# Patient Record
Sex: Female | Born: 1950 | Race: Black or African American | Hispanic: No | Smoking: Former smoker
Health system: Southern US, Community
[De-identification: ages and names within clinical notes are randomized; demographics above are authoritative.]

## PROBLEM LIST (undated history)

## (undated) DIAGNOSIS — Z86010 Personal history of colonic polyps: Secondary | ICD-10-CM

## (undated) DIAGNOSIS — G4733 Obstructive sleep apnea (adult) (pediatric): Secondary | ICD-10-CM

## (undated) DIAGNOSIS — J309 Allergic rhinitis, unspecified: Secondary | ICD-10-CM

## (undated) DIAGNOSIS — I10 Essential (primary) hypertension: Secondary | ICD-10-CM

## (undated) DIAGNOSIS — G4730 Sleep apnea, unspecified: Secondary | ICD-10-CM

## (undated) DIAGNOSIS — Z862 Personal history of diseases of the blood and blood-forming organs and certain disorders involving the immune mechanism: Secondary | ICD-10-CM

## (undated) DIAGNOSIS — E785 Hyperlipidemia, unspecified: Secondary | ICD-10-CM

## (undated) DIAGNOSIS — Z860101 Personal history of adenomatous and serrated colon polyps: Secondary | ICD-10-CM

## (undated) DIAGNOSIS — R3129 Other microscopic hematuria: Secondary | ICD-10-CM

## (undated) DIAGNOSIS — D649 Anemia, unspecified: Secondary | ICD-10-CM

## (undated) DIAGNOSIS — D3A029 Benign carcinoid tumor of the large intestine, unspecified portion: Secondary | ICD-10-CM

## (undated) DIAGNOSIS — Z9989 Dependence on other enabling machines and devices: Secondary | ICD-10-CM

## (undated) DIAGNOSIS — M722 Plantar fascial fibromatosis: Secondary | ICD-10-CM

## (undated) DIAGNOSIS — K219 Gastro-esophageal reflux disease without esophagitis: Secondary | ICD-10-CM

## (undated) DIAGNOSIS — R011 Cardiac murmur, unspecified: Secondary | ICD-10-CM

## (undated) DIAGNOSIS — E210 Primary hyperparathyroidism: Secondary | ICD-10-CM

## (undated) DIAGNOSIS — R51 Headache: Secondary | ICD-10-CM

## (undated) HISTORY — DX: Cardiac murmur, unspecified: R01.1

## (undated) HISTORY — DX: Benign carcinoid tumor of the large intestine, unspecified portion: D3A.029

## (undated) HISTORY — DX: Obstructive sleep apnea (adult) (pediatric): G47.33

## (undated) HISTORY — DX: Primary hyperparathyroidism: E21.0

## (undated) HISTORY — PX: OTHER SURGICAL HISTORY: SHX169

## (undated) HISTORY — DX: Other microscopic hematuria: R31.29

## (undated) HISTORY — DX: Personal history of diseases of the blood and blood-forming organs and certain disorders involving the immune mechanism: Z86.2

## (undated) HISTORY — PX: COLONOSCOPY W/ POLYPECTOMY: SHX1380

## (undated) HISTORY — DX: Hypercalcemia: E83.52

## (undated) HISTORY — DX: Personal history of colonic polyps: Z86.010

## (undated) HISTORY — PX: ESOPHAGOGASTRODUODENOSCOPY: SHX1529

## (undated) HISTORY — DX: Hyperlipidemia, unspecified: E78.5

## (undated) HISTORY — DX: Essential (primary) hypertension: I10

## (undated) HISTORY — DX: Personal history of adenomatous and serrated colon polyps: Z86.0101

## (undated) HISTORY — DX: Obstructive sleep apnea (adult) (pediatric): Z99.89

## (undated) HISTORY — DX: Plantar fascial fibromatosis: M72.2

## (undated) HISTORY — DX: Anemia, unspecified: D64.9

## (undated) HISTORY — DX: Allergic rhinitis, unspecified: J30.9

## (undated) HISTORY — PX: TUBAL LIGATION: SHX77

## (undated) HISTORY — DX: Gastro-esophageal reflux disease without esophagitis: K21.9

## (undated) HISTORY — PX: OVARIAN CYST REMOVAL: SHX89

## (undated) HISTORY — DX: Headache: R51

## (undated) HISTORY — DX: Sleep apnea, unspecified: G47.30

---

## 2006-02-10 ENCOUNTER — Ambulatory Visit: Payer: Self-pay | Admitting: Internal Medicine

## 2006-04-19 ENCOUNTER — Emergency Department (HOSPITAL_COMMUNITY): Admission: EM | Admit: 2006-04-19 | Discharge: 2006-04-19 | Payer: Self-pay | Admitting: Emergency Medicine

## 2007-05-31 ENCOUNTER — Ambulatory Visit: Payer: Self-pay | Admitting: Internal Medicine

## 2007-12-08 ENCOUNTER — Emergency Department (HOSPITAL_COMMUNITY): Admission: EM | Admit: 2007-12-08 | Discharge: 2007-12-08 | Payer: Self-pay | Admitting: Emergency Medicine

## 2007-12-14 ENCOUNTER — Ambulatory Visit: Payer: Self-pay | Admitting: Internal Medicine

## 2007-12-27 DIAGNOSIS — D3A029 Benign carcinoid tumor of the large intestine, unspecified portion: Secondary | ICD-10-CM

## 2007-12-27 HISTORY — DX: Benign carcinoid tumor of the large intestine, unspecified portion: D3A.029

## 2008-01-04 DIAGNOSIS — Z860100 Personal history of colon polyps, unspecified: Secondary | ICD-10-CM

## 2008-01-04 HISTORY — DX: Personal history of colonic polyps: Z86.010

## 2008-01-04 HISTORY — DX: Personal history of colon polyps, unspecified: Z86.0100

## 2008-01-10 ENCOUNTER — Ambulatory Visit: Payer: Self-pay | Admitting: Internal Medicine

## 2008-01-10 ENCOUNTER — Encounter: Payer: Self-pay | Admitting: Internal Medicine

## 2008-02-18 ENCOUNTER — Ambulatory Visit: Payer: Self-pay | Admitting: Internal Medicine

## 2008-04-25 LAB — HM MAMMOGRAPHY: HM Mammogram: NORMAL

## 2008-07-10 ENCOUNTER — Ambulatory Visit: Payer: Self-pay | Admitting: Cardiology

## 2009-04-10 ENCOUNTER — Telehealth: Payer: Self-pay | Admitting: Internal Medicine

## 2009-04-29 ENCOUNTER — Ambulatory Visit: Payer: Self-pay | Admitting: Internal Medicine

## 2009-05-08 ENCOUNTER — Ambulatory Visit: Payer: Self-pay | Admitting: Internal Medicine

## 2009-05-08 DIAGNOSIS — J309 Allergic rhinitis, unspecified: Secondary | ICD-10-CM | POA: Insufficient documentation

## 2009-05-08 DIAGNOSIS — R51 Headache: Secondary | ICD-10-CM | POA: Insufficient documentation

## 2009-05-08 DIAGNOSIS — E785 Hyperlipidemia, unspecified: Secondary | ICD-10-CM

## 2009-05-08 DIAGNOSIS — R519 Headache, unspecified: Secondary | ICD-10-CM | POA: Insufficient documentation

## 2009-05-08 HISTORY — DX: Hyperlipidemia, unspecified: E78.5

## 2009-05-11 ENCOUNTER — Encounter (INDEPENDENT_AMBULATORY_CARE_PROVIDER_SITE_OTHER): Payer: Self-pay | Admitting: *Deleted

## 2009-05-11 LAB — CONVERTED CEMR LAB
ALT: 19 units/L (ref 0–35)
AST: 18 units/L (ref 0–37)
Albumin: 4.1 g/dL (ref 3.5–5.2)
Alkaline Phosphatase: 112 units/L (ref 39–117)
BUN: 13 mg/dL (ref 6–23)
Basophils Absolute: 0 10*3/uL (ref 0.0–0.1)
Basophils Relative: 0.7 % (ref 0.0–3.0)
Bilirubin Urine: NEGATIVE
Bilirubin, Direct: 0.1 mg/dL (ref 0.0–0.3)
CO2: 30 meq/L (ref 19–32)
Calcium: 10.6 mg/dL — ABNORMAL HIGH (ref 8.4–10.5)
Chloride: 108 meq/L (ref 96–112)
Cholesterol: 285 mg/dL — ABNORMAL HIGH (ref 0–200)
Creatinine, Ser: 0.7 mg/dL (ref 0.4–1.2)
Direct LDL: 187.6 mg/dL
Eosinophils Absolute: 0.1 10*3/uL (ref 0.0–0.7)
Eosinophils Relative: 2.1 % (ref 0.0–5.0)
GFR calc non Af Amer: 110.82 mL/min (ref >60)
Glucose, Bld: 82 mg/dL (ref 70–99)
HCT: 36.2 % (ref 36.0–46.0)
HDL: 57.7 mg/dL (ref >39.00)
Hemoglobin: 11.9 g/dL — ABNORMAL LOW (ref 12.0–15.0)
Ketones, ur: NEGATIVE mg/dL
Leukocytes, UA: NEGATIVE
Lymphocytes Relative: 46.3 % — ABNORMAL HIGH (ref 12.0–46.0)
Lymphs Abs: 3 10*3/uL (ref 0.7–4.0)
MCHC: 32.8 g/dL (ref 30.0–36.0)
MCV: 82.3 fL (ref 78.0–100.0)
Monocytes Absolute: 0.6 10*3/uL (ref 0.1–1.0)
Monocytes Relative: 8.6 % (ref 3.0–12.0)
Neutro Abs: 2.7 10*3/uL (ref 1.4–7.7)
Neutrophils Relative %: 42.3 % — ABNORMAL LOW (ref 43.0–77.0)
Nitrite: NEGATIVE
Platelets: 252 10*3/uL (ref 150.0–400.0)
Potassium: 4.6 meq/L (ref 3.5–5.1)
RBC: 4.4 M/uL (ref 3.87–5.11)
RDW: 12.9 % (ref 11.5–14.6)
Sodium: 141 meq/L (ref 135–145)
Specific Gravity, Urine: >=1.03 (ref 1.000–1.030)
TSH: 0.97 microintl units/mL (ref 0.35–5.50)
Total Bilirubin: 0.6 mg/dL (ref 0.3–1.2)
Total CHOL/HDL Ratio: 5
Total Protein, Urine: NEGATIVE mg/dL
Total Protein: 7.9 g/dL (ref 6.0–8.3)
Triglycerides: 110 mg/dL (ref 0.0–149.0)
Urine Glucose: NEGATIVE mg/dL
Urobilinogen, UA: 0.2 (ref 0.0–1.0)
VLDL: 22 mg/dL (ref 0.0–40.0)
WBC: 6.4 10*3/uL (ref 4.5–10.5)
pH: 5 (ref 5.0–8.0)

## 2009-05-14 ENCOUNTER — Ambulatory Visit: Payer: Self-pay | Admitting: Internal Medicine

## 2009-05-14 ENCOUNTER — Encounter: Payer: Self-pay | Admitting: Internal Medicine

## 2009-05-26 ENCOUNTER — Encounter: Payer: Self-pay | Admitting: Internal Medicine

## 2009-07-02 ENCOUNTER — Telehealth: Payer: Self-pay | Admitting: Internal Medicine

## 2009-07-02 ENCOUNTER — Encounter (INDEPENDENT_AMBULATORY_CARE_PROVIDER_SITE_OTHER): Payer: Self-pay | Admitting: *Deleted

## 2009-07-02 ENCOUNTER — Ambulatory Visit: Payer: Self-pay | Admitting: Internal Medicine

## 2009-07-02 DIAGNOSIS — R3129 Other microscopic hematuria: Secondary | ICD-10-CM | POA: Insufficient documentation

## 2009-07-02 HISTORY — DX: Other microscopic hematuria: R31.29

## 2009-07-02 LAB — CONVERTED CEMR LAB
Bilirubin Urine: NEGATIVE
Ketones, ur: NEGATIVE mg/dL
Urobilinogen, UA: 1 (ref 0.0–1.0)

## 2009-07-22 ENCOUNTER — Telehealth: Payer: Self-pay | Admitting: Internal Medicine

## 2009-08-12 ENCOUNTER — Encounter: Payer: Self-pay | Admitting: Internal Medicine

## 2009-08-26 ENCOUNTER — Encounter: Payer: Self-pay | Admitting: Internal Medicine

## 2009-10-02 ENCOUNTER — Ambulatory Visit: Payer: Self-pay | Admitting: Internal Medicine

## 2009-10-02 DIAGNOSIS — M76899 Other specified enthesopathies of unspecified lower limb, excluding foot: Secondary | ICD-10-CM | POA: Insufficient documentation

## 2009-11-23 ENCOUNTER — Ambulatory Visit: Payer: Self-pay | Admitting: Internal Medicine

## 2009-11-23 DIAGNOSIS — M542 Cervicalgia: Secondary | ICD-10-CM | POA: Insufficient documentation

## 2009-11-24 LAB — CONVERTED CEMR LAB
Calcium: 10.6 mg/dL — ABNORMAL HIGH (ref 8.4–10.5)
Chloride: 106 meq/L (ref 96–112)
Creatinine, Ser: 1 mg/dL (ref 0.4–1.2)
Hgb A1c MFr Bld: 5.9 % (ref 4.6–6.5)
Potassium: 4.7 meq/L (ref 3.5–5.1)
Sodium: 141 meq/L (ref 135–145)

## 2010-01-06 ENCOUNTER — Ambulatory Visit: Payer: Self-pay | Admitting: Internal Medicine

## 2010-01-13 ENCOUNTER — Ambulatory Visit: Payer: Self-pay | Admitting: Internal Medicine

## 2010-01-13 LAB — CONVERTED CEMR LAB
HDL: 67.1 mg/dL (ref >39.00)
Total CHOL/HDL Ratio: 3
Triglycerides: 62 mg/dL (ref 0.0–149.0)
VLDL: 12.4 mg/dL (ref 0.0–40.0)

## 2010-07-01 ENCOUNTER — Ambulatory Visit: Payer: Self-pay | Admitting: Internal Medicine

## 2010-07-01 DIAGNOSIS — R198 Other specified symptoms and signs involving the digestive system and abdomen: Secondary | ICD-10-CM | POA: Insufficient documentation

## 2010-07-01 LAB — CONVERTED CEMR LAB
Albumin: 4.1 g/dL (ref 3.5–5.2)
Bilirubin, Direct: 0.1 mg/dL (ref 0.0–0.3)
Total Bilirubin: 0.5 mg/dL (ref 0.3–1.2)
Total Protein: 7.7 g/dL (ref 6.0–8.3)

## 2010-10-01 ENCOUNTER — Ambulatory Visit: Payer: Self-pay | Admitting: Internal Medicine

## 2011-01-19 ENCOUNTER — Other Ambulatory Visit: Payer: Self-pay | Admitting: Internal Medicine

## 2011-01-19 ENCOUNTER — Ambulatory Visit
Admission: RE | Admit: 2011-01-19 | Discharge: 2011-01-19 | Payer: Self-pay | Source: Home / Self Care | Attending: Internal Medicine | Admitting: Internal Medicine

## 2011-01-19 DIAGNOSIS — K219 Gastro-esophageal reflux disease without esophagitis: Secondary | ICD-10-CM | POA: Insufficient documentation

## 2011-01-19 DIAGNOSIS — R1084 Generalized abdominal pain: Secondary | ICD-10-CM | POA: Insufficient documentation

## 2011-01-19 DIAGNOSIS — M549 Dorsalgia, unspecified: Secondary | ICD-10-CM | POA: Insufficient documentation

## 2011-01-19 LAB — LDL CHOLESTEROL, DIRECT: Direct LDL: 136.2 mg/dL

## 2011-01-19 LAB — URINALYSIS, ROUTINE W REFLEX MICROSCOPIC
Ketones, ur: NEGATIVE
Total Protein, Urine: NEGATIVE

## 2011-01-19 LAB — LIPID PANEL
HDL: 64.3 mg/dL (ref >39.00)
Total CHOL/HDL Ratio: 3
Triglycerides: 125 mg/dL (ref 0.0–149.0)
VLDL: 25 mg/dL (ref 0.0–40.0)

## 2011-01-19 LAB — CBC WITH DIFFERENTIAL/PLATELET
Basophils Absolute: 0 10*3/uL (ref 0.0–0.1)
Basophils Relative: 0.3 % (ref 0.0–3.0)
Eosinophils Absolute: 0.2 10*3/uL (ref 0.0–0.7)
HCT: 37.1 % (ref 36.0–46.0)
MCHC: 32.9 g/dL (ref 30.0–36.0)
Monocytes Absolute: 0.7 10*3/uL (ref 0.1–1.0)
Monocytes Relative: 8.8 % (ref 3.0–12.0)
Neutrophils Relative %: 45.4 % (ref 43.0–77.0)
RBC: 4.5 Mil/uL (ref 3.87–5.11)
RDW: 13.3 % (ref 11.5–14.6)
WBC: 7.6 10*3/uL (ref 4.5–10.5)

## 2011-01-19 LAB — BASIC METABOLIC PANEL
CO2: 30 mEq/L (ref 19–32)
Creatinine, Ser: 0.7 mg/dL (ref 0.4–1.2)
GFR: 104.96 mL/min (ref >60.00)
Glucose, Bld: 90 mg/dL (ref 70–99)
Potassium: 4.3 mEq/L (ref 3.5–5.1)
Sodium: 142 mEq/L (ref 135–145)

## 2011-01-19 LAB — HEPATIC FUNCTION PANEL
AST: 18 U/L (ref 0–37)
Alkaline Phosphatase: 108 U/L (ref 39–117)

## 2011-01-24 ENCOUNTER — Encounter: Payer: Self-pay | Admitting: Internal Medicine

## 2011-01-24 ENCOUNTER — Ambulatory Visit
Admission: RE | Admit: 2011-01-24 | Discharge: 2011-01-24 | Payer: Self-pay | Source: Home / Self Care | Attending: Internal Medicine | Admitting: Internal Medicine

## 2011-01-25 DIAGNOSIS — E210 Primary hyperparathyroidism: Secondary | ICD-10-CM | POA: Insufficient documentation

## 2011-01-25 LAB — CONVERTED CEMR LAB
Calcium, Total (PTH): 10.6 mg/dL — ABNORMAL HIGH (ref 8.4–10.5)
PTH: 182.9 pg/mL — ABNORMAL HIGH (ref 14.0–72.0)

## 2011-01-25 NOTE — Assessment & Plan Note (Signed)
Summary: changes in bowel movement--ch.   History of Present Illness Visit Type: Follow-up Visit Primary GI MD: Stan Head MD [facility] Primary Provider: Newt Lukes MD Chief Complaint: changes in bowel movements, blood tinged mucous in past History of Present Illness:   In February she noticed some bright red blood and mucous in the stool, while she had been on Mobic. Short-lived but resolved. She also had a white stool after Tums, one time. Chronic mild alternating bowel habits otherwise unchanged.   GI Review of Systems      Denies abdominal pain, acid reflux, belching, bloating, chest pain, dysphagia with liquids, dysphagia with solids, heartburn, loss of appetite, nausea, vomiting, vomiting blood, weight loss, and  weight gain.      Reports change in bowel habits, hemorrhoids, and  rectal bleeding.     Denies anal fissure, black tarry stools, constipation, diarrhea, diverticulosis, fecal incontinence, heme positive stool, irritable bowel syndrome, jaundice, light color stool, liver problems, and  rectal pain. Clinical Reports Reviewed:  Colonoscopy:  05/14/2009:  1) Scar in the rectum, site of prior carcinoid (NEGATIVE FOR CARCINOID) 2) Internal hemorrhoids 3) Otherwise normal examination, excellent prep 4) pror adenomas, max size 4 cm, 1/09   Preventive Screening-Counseling & Management      Drug Use:  no.      Current Medications (verified): 1)  Simvastatin 20 Mg Tabs (Simvastatin) .... Take 1 By Mouth At Bedtime  Allergies (verified): No Known Drug Allergies  Past History:  Past Medical History: Hyperlipidemia Allergic rhinitis microscopic hemauturia  Adenomatous Colon Polyps Rectal carcinoid  Past Surgical History: Reviewed history from 05/08/2009 and no changes required. Colon polypectomy x's 4 Tubal ligation  Family History: Reviewed history from 05/08/2009 and no changes required. History of coronary artery disease in her family with her  father having PCI at age 34. positive family history for lung cancer No FH of Colon Cancer:  Social History: Reviewed history from 05/08/2009 and no changes required. Single, has two children.  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 Ex-smoker- quit 20 years ago Alcohol Use - no Daily Caffeine Use Illicit Drug Use - no Drug Use:  no  Vital Signs:  Patient profile:   60 year old female Height:      61 inches Weight:      200 pounds BMI:     37.93 Pulse rate:   84 / minute Pulse rhythm:   regular BP sitting:   118 / 76  (left arm)  Vitals Entered By: Milford Cage NCMA (October 01, 2010 4:09 PM)  Physical Exam  General:  Well developed, well nourished, no acute distress.obese.     Impression & Recommendations:  Problem # 1:  CHANGE IN BOWELS (ICD-787.99) Assessment Unchanged chronic mild alternating bowel habits white stool from Tums no work-up needed, no changes  Problem # 2:  RECTAL BLEEDING (ICD-569.3) Assessment: New Transient has hemorrhoids has not recurred since February due for routine colonoscopy 04/2011 and would not change that  Patient Instructions: 1)  Please continue current medications.  2)  Call back for persistent changes.  If you continue to do well, we will see in around May 2012 for your colonoscopy. 3)  The medication list was reviewed and reconciled.  All changed / newly prescribed medications were explained.  A complete medication list was provided to the patient / caregiver.

## 2011-01-25 NOTE — Assessment & Plan Note (Signed)
Summary: 3 MO ROV /NWS   Vital Signs:  Patient profile:   60 year old female Height:      61 inches (154.94 cm) Weight:      197 pounds (89.55 kg) O2 Sat:      99 % on Room air Temp:     98.2 degrees F (36.78 degrees C) oral Pulse rate:   72 / minute BP sitting:   112 / 72  (left arm) Cuff size:   large  Vitals Entered By: Josph Macho CMA (January 06, 2010 3:46 PM)  O2 Flow:  Room air CC: 3 month follow up/ pt states she is no longer using Clotrimazole or taking Methocarbamol/ CF Is Patient Diabetic? No   Primary Care Provider:  Newt Lukes MD  CC:  3 month follow up/ pt states she is no longer using Clotrimazole or taking Methocarbamol/ CF.  History of Present Illness: 1) dyslipidemia - reports compliance with ongoing medical treatment and no changes in medication dose or frequency. denies adverse side effects related to current therapy.   2)OA/bursitis r hip - reports compliance with ongoing medical treatment and no changes in medication dose or frequency. denies adverse side effects related to current therapy.   Clinical Review Panels:  Lipid Management   Cholesterol:  285 (05/08/2009)   HDL (good cholesterol):  57.70 (05/08/2009)   Current Medications (verified): 1)  Simvastatin 20 Mg Tabs (Simvastatin) .... Take 1 By Mouth At Bedtime 2)  Meloxicam 7.5 Mg Tabs (Meloxicam) .Marland Kitchen.. 1 By Mouth Once Daily As Needed For Arthritis Pain  Allergies (verified): No Known Drug Allergies  Review of Systems  The patient denies fever, weight loss, chest pain, syncope, muscle weakness, and difficulty walking.    Physical Exam  General:  alert, well-developed, well-nourished, and cooperative to examination.   overweight appearing Lungs:  normal respiratory effort, no intercostal retractions or use of accessory muscles; normal breath sounds bilaterally - no crackles and no wheezes.    Heart:  normal rate, regular rhythm, no murmur, and no rub. BLE without edema.    Abdomen:  soft, non-tender, normal bowel sounds, no distention; no masses and no appreciable hepatomegaly or splenomegaly.     Impression & Recommendations:  Problem # 1:  HYPERLIPIDEMIA (ICD-272.4) need to check FLP and LFTs when fasting - to return in AM next 48h - plan to cont current meds if well controlled- refills to be called in once reviewed -  Her updated medication list for this problem includes:    Simvastatin 20 Mg Tabs (Simvastatin) .Marland Kitchen... Take 1 by mouth at bedtime  Labs Reviewed: SGOT: 18 (05/08/2009)   SGPT: 19 (05/08/2009)   HDL:57.70 (05/08/2009)  Chol:285 (05/08/2009)  Trig:110.0 (05/08/2009)  Complete Medication List: 1)  Simvastatin 20 Mg Tabs (Simvastatin) .... Take 1 by mouth at bedtime 2)  Meloxicam 7.5 Mg Tabs (Meloxicam) .Marland Kitchen.. 1 by mouth once daily as needed for arthritis pain  Patient Instructions: 1)  it was good to see you today. 2)  return for your blood work in the AM when you are fasting 3)  Hepatic Panel, ICD-9:v58.69 4)  Lipid Panel, ICD-9:272.4 5)  your results will be posted on the phone tree for review in 48-72 hours from the time of test completion; call [phone] and enter your 9 digit MRN (listed above on this page, just below your name); if any changes need to be made or there are abnormal results, you will be contacted directly.  6)  Please schedule a follow-up  appointment in 6 months, sooner if problems.

## 2011-01-25 NOTE — Assessment & Plan Note (Signed)
Summary: WHITE BOWEL MOVEMENT THIS AM---STC   Vital Signs:  Patient profile:   60 year old female Height:      61 inches (154.94 cm) Weight:      198.6 pounds (90.27 kg) BMI:     37.66 O2 Sat:      97 % on Room air Temp:     97.3 degrees F (36.28 degrees C) oral Pulse rate:   68 / minute BP sitting:   122 / 72  (left arm) Cuff size:   large  Vitals Entered By: Orlan Leavens (July 01, 2010 9:21 AM)  O2 Flow:  Room air CC: Concern about having white bowel movement Is Patient Diabetic? No Pain Assessment Patient in pain? no        Primary Care Provider:  Newt Lukes MD  CC:  Concern about having white bowel movement.  History of Present Illness: c/o "white bowel movement" this AM x 1 - denies diet changes or abd pain - no BRBPR or change in BMs before today's event no D or C, no N/V after reading online, became worried about her liver and pancreas -  would like labs done today to check same  also review other med issues 1) dyslipidemia - reports compliance with ongoing medical treatment and no changes in medication dose or frequency. denies adverse side effects related to current therapy. no myalgias or GI upset (see above)  2)OA/bursitis r hip - reports compliance with ongoing medical treatment and no changes in medication dose or frequency. denies adverse side effects related to current therapy.  no swelling in joints  Clinical Review Panels:  Lipid Management   Cholesterol:  183 (01/13/2010)   LDL (bad choesterol):  104 (01/13/2010)   HDL (good cholesterol):  67.10 (01/13/2010)  Diabetes Management   HgBA1C:  5.9 (11/23/2009)   Creatinine:  1.0 (11/23/2009)  Complete Metabolic Panel   Glucose:  86 (11/23/2009)   Sodium:  141 (11/23/2009)   Potassium:  4.7 (11/23/2009)   Chloride:  106 (11/23/2009)   CO2:  29 (11/23/2009)   BUN:  14 (11/23/2009)   Creatinine:  1.0 (11/23/2009)   Albumin:  4.0 (01/13/2010)   Total Protein:  7.4 (01/13/2010)  Calcium:  10.6 (11/23/2009)   Total Bili:  0.6 (01/13/2010)   Alk Phos:  102 (01/13/2010)   SGPT (ALT):  23 (01/13/2010)   SGOT (AST):  22 (01/13/2010)   Current Medications (verified): 1)  Simvastatin 20 Mg Tabs (Simvastatin) .... Take 1 By Mouth At Bedtime 2)  Meloxicam 7.5 Mg Tabs (Meloxicam) .Marland Kitchen.. 1 By Mouth Once Daily As Needed For Arthritis Pain  Allergies (verified): No Known Drug Allergies  Past History:  Past Medical History: Hyperlipidemia Allergic rhinitis microscopic hemauturia   Review of Systems  The patient denies anorexia, fever, weight loss, chest pain, peripheral edema, and abdominal pain.         c/o back pain when lying flat on back, improved with massage or position change  Physical Exam  General:  alert, well-developed, well-nourished, and cooperative to examination.   overweight appearing Lungs:  normal respiratory effort, no intercostal retractions or use of accessory muscles; normal breath sounds bilaterally - no crackles and no wheezes.    Heart:  normal rate, regular rhythm, no murmur, and no rub. BLE without edema.  Abdomen:  soft, non-tender, normal bowel sounds, no distention; no masses and no appreciable hepatomegaly or splenomegaly.     Impression & Recommendations:  Problem # 1:  CHANGE IN BOWELS (  ICD-787.99)  one time event this AM -sample not available - expect related to diet intake - no systemic symptoms to suggest hepatobilary or panc dz - check labs as related to statins and lipase at pt request fpr pancreas check - to f/u if cont BM abn recurrs  Orders: TLB-Hepatic/Liver Function Pnl (80076-HEPATIC) TLB-Lipase (83690-LIPASE)  Problem # 2:  HYPERLIPIDEMIA (ICD-272.4)  hold statin x 48 h, then resume if normal BM and labs Her updated medication list for this problem includes:    Simvastatin 20 Mg Tabs (Simvastatin) .Marland Kitchen... Take 1 by mouth at bedtime  Labs Reviewed: SGOT: 22 (01/13/2010)   SGPT: 23 (01/13/2010)   HDL:67.10  (01/13/2010), 57.70 (05/08/2009)  LDL:104 (01/13/2010)  Chol:183 (01/13/2010), 285 (05/08/2009)  Trig:62.0 (01/13/2010), 110.0 (05/08/2009)  Complete Medication List: 1)  Simvastatin 20 Mg Tabs (Simvastatin) .... Take 1 by mouth at bedtime 2)  Meloxicam 7.5 Mg Tabs (Meloxicam) .Marland Kitchen.. 1 by mouth once daily as needed for arthritis pain  Patient Instructions: 1)  it was good to see you today. 2)  test(s) ordered today - your results will be posted on the phone tree for review in 48-72 hours from the time of test completion; call [phone] and enter your 9 digit MRN (listed above on this page, just below your name); if any changes need to be made or there are abnormal results, you will be contacted directly.  3)  hold simvastatin x 48hours, then resume as before if normal labs and normal BMs 4)  Please schedule a follow-up appointment in 6 months, sooner if problems.

## 2011-01-25 NOTE — Progress Notes (Signed)
Summary: [facility]   Imported By: Sherian Rein 10/04/2010 10:30:14  _____________________________________________________________________  External Attachment:    Type:   Image     Comment:   External Document

## 2011-01-27 NOTE — Assessment & Plan Note (Signed)
Summary: back pain and stomach problems-lb   Vital Signs:  Patient profile:   60 year old female Height:      61 inches (154.94 cm) Weight:      200 pounds (90.91 kg) O2 Sat:      98 % on Room air Temp:     98.4 degrees F (36.89 degrees C) oral Pulse rate:   76 / minute BP sitting:   130 / 72  (left arm) Cuff size:   large  Vitals Entered By: Orlan Leavens RMA (January 19, 2011 1:13 PM)  O2 Flow:  Room air CC: Upper back pain & stomach problems Is Patient Diabetic? No Pain Assessment Patient in pain? yes     Location: upper back Type: aching   Primary Care Provider:  Newt Lukes MD  CC:  Upper back pain & stomach problems.  History of Present Illness: c/o stomach pain onset 2 mo ago assoc with upper mid back pain, not positional - no injury not changed with food - type of meals some imporved abd pain with tums symptoms worse at bedtime, lying flat denies diet changes or BM changes - no BRBPR or change in BMs no N/V, no weight loss - no use otc nsaids  also review other med issues 1) dyslipidemia - reports compliance with ongoing medical treatment and no changes in medication dose or frequency. denies adverse side effects related to current therapy. no myalgias   2)OA/bursitis r hip - reports compliance with ongoing medical treatment (otc tylenol) and no changes in medication dose or frequency. denies adverse side effects related to current therapy.  no swelling in joints  Clinical Review Panels:  Lipid Management   Cholesterol:  183 (01/13/2010)   LDL (bad choesterol):  104 (01/13/2010)   HDL (good cholesterol):  67.10 (01/13/2010)  CBC   WBC:  6.4 (05/08/2009)   RBC:  4.40 (05/08/2009)   Hgb:  11.9 (05/08/2009)   Hct:  36.2 (05/08/2009)   Platelets:  252.0 (05/08/2009)   MCV  82.3 (05/08/2009)   MCHC  32.8 (05/08/2009)   RDW  12.9 (05/08/2009)   PMN:  42.3 (05/08/2009)   Lymphs:  46.3 (05/08/2009)   Monos:  8.6 (05/08/2009)   Eosinophils:  2.1  (05/08/2009)   Basophil:  0.7 (05/08/2009)  Complete Metabolic Panel   Glucose:  86 (11/23/2009)   Sodium:  141 (11/23/2009)   Potassium:  4.7 (11/23/2009)   Chloride:  106 (11/23/2009)   CO2:  29 (11/23/2009)   BUN:  14 (11/23/2009)   Creatinine:  1.0 (11/23/2009)   Albumin:  4.1 (07/01/2010)   Total Protein:  7.7 (07/01/2010)   Calcium:  10.6 (11/23/2009)   Total Bili:  0.5 (07/01/2010)   Alk Phos:  94 (07/01/2010)   SGPT (ALT):  17 (07/01/2010)   SGOT (AST):  17 (07/01/2010)   Current Medications (verified): 1)  Simvastatin 20 Mg Tabs (Simvastatin) .... Take 1 By Mouth At Bedtime  Allergies (verified): No Known Drug Allergies  Past History:  Past Medical History: Hyperlipidemia Allergic rhinitis microscopic hemauturia  Adenomatous Colon Polyps Rectal carcinoid  MD roster:  GI - gessner  Past Surgical History: Colon polypectomy x's 4 Tubal ligation   Review of Systems  The patient denies weight loss, weight gain, chest pain, headaches, and severe indigestion/heartburn.    Physical Exam  General:  alert, well-developed, well-nourished, and cooperative to examination.   overweight appearing - nontoxic Lungs:  normal respiratory effort, no intercostal retractions or use of accessory muscles; normal  breath sounds bilaterally - no crackles and no wheezes.    Heart:  normal rate, regular rhythm, no murmur, and no rub. BLE without edema.  Abdomen:  soft, non-tender, normal bowel sounds, no distention; no masses and no appreciable hepatomegaly or splenomegaly.   Msk:  back: full range of motion of lumbar spine. Nontender to palpation. Negative straight leg raise. Deep tendon reflexes symmetrically intact. Sensation intact throughout all dermatomes in bilateral lower extremities. Full strength to manual muscle testing. Able to heel and toe walk without difficulty and ambulates with a normal gait.    Impression & Recommendations:  Problem # 1:  ABDOMINAL PAIN,  GENERALIZED (ICD-789.07)  nonsp exam and hx - ?GERD - check labs now and start emperic ppi - consider further eval as needed depending on labs and response to tx - keep ROV with GI as planned Orders: TLB-BMP (Basic Metabolic Panel-BMET) (80048-METABOL) TLB-CBC Platelet - w/Differential (85025-CBCD) TLB-Hepatic/Liver Function Pnl (80076-HEPATIC) TLB-Udip w/ Micro (81001-URINE) TLB-TSH (Thyroid Stimulating Hormone) (84443-TSH)  Discussed symptom control with the patient.   Problem # 2:  GERD (ICD-530.81)  Her updated medication list for this problem includes:    Pantoprazole Sodium 20 Mg Tbec (Pantoprazole sodium) .Marland Kitchen... 1 by mouth once daily  Orders: TLB-BMP (Basic Metabolic Panel-BMET) (80048-METABOL) TLB-CBC Platelet - w/Differential (85025-CBCD) TLB-Hepatic/Liver Function Pnl (80076-HEPATIC) Prescription Created Electronically [phone])  Labs Reviewed: Hgb: 11.9 (05/08/2009)   Hct: 36.2 (05/08/2009)  Problem # 3:  UNSPECIFIED BACKACHE (ICD-724.5) timing correlates with abd pain - w/u and tx plans as above -  back exam benign and no trauma hx, no weight loss  Orders: TLB-BMP (Basic Metabolic Panel-BMET) (80048-METABOL) TLB-CBC Platelet - w/Differential (85025-CBCD) TLB-Hepatic/Liver Function Pnl (80076-HEPATIC) TLB-Udip w/ Micro (81001-URINE)  Problem # 4:  HYPERLIPIDEMIA (ICD-272.4)  Her updated medication list for this problem includes:    Simvastatin 20 Mg Tabs (Simvastatin) .Marland Kitchen... Take 1 by mouth at bedtime  Orders: TLB-Lipid Panel (80061-LIPID)  Labs Reviewed: SGOT: 17 (07/01/2010)   SGPT: 17 (07/01/2010)   HDL:67.10 (01/13/2010), 57.70 (05/08/2009)  LDL:104 (01/13/2010)  Chol:183 (01/13/2010), 285 (05/08/2009)  Trig:62.0 (01/13/2010), 110.0 (05/08/2009)  Complete Medication List: 1)  Simvastatin 20 Mg Tabs (Simvastatin) .... Take 1 by mouth at bedtime 2)  Pantoprazole Sodium 20 Mg Tbec (Pantoprazole sodium) .Marland Kitchen.. 1 by mouth once daily  Patient Instructions: 1)   it was good to see you today. 2)  test(s) ordered today - your results will be posted on the phone tree for review in 48-72 hours from the time of test completion; call [phone] and enter your 9 digit MRN (listed above on this page, just below your name); if any changes need to be made or there are abnormal results, you will be contacted directly. 3)  start generic protonix for stomach and reflux symptoms  - your prescription has been electronically submitted to your pharmacy. Please take as directed. Contact our office if you believe you're having problems with the medication(s).  4)  no other medication changes - ok to use tylenol as needed  5)  depending on labs and your response to medications, let us know if symptoms unimproved in next few weeks, sooner if worse - keep followup with dr. Leone Payor as planned 6)  Please schedule a follow-up appointment in 6 months for cholesterol check, call sooner if problems.  Prescriptions: PANTOPRAZOLE SODIUM 20 MG TBEC (PANTOPRAZOLE SODIUM) 1 by mouth once daily  #30 x 3   Entered and Authorized by:   Newt Lukes MD   Signed  by:   Newt Lukes MD on 01/19/2011   Method used:   Electronically to        [facility]  [address] [phone]* (retail)       [address]       [place], [place]  [address]       Ph: [phone] or [phone]       Fax: [phone]   RxID:   [id number]    Orders Added: 1)  Est. Patient Level IV [36644] 2)  TLB-BMP (Basic Metabolic Panel-BMET) [80048-METABOL] 3)  TLB-CBC Platelet - w/Differential [85025-CBCD] 4)  TLB-Hepatic/Liver Function Pnl [80076-HEPATIC] 5)  TLB-Udip w/ Micro [81001-URINE] 6)  Prescription Created Electronically [G8553] 7)  TLB-Lipid Panel [80061-LIPID] 8)  TLB-TSH (Thyroid Stimulating Hormone) [03474-QVZ]

## 2011-02-11 ENCOUNTER — Ambulatory Visit: Payer: Self-pay | Admitting: Endocrinology

## 2011-02-24 ENCOUNTER — Encounter: Payer: Self-pay | Admitting: Endocrinology

## 2011-02-24 ENCOUNTER — Ambulatory Visit (INDEPENDENT_AMBULATORY_CARE_PROVIDER_SITE_OTHER): Payer: BC Managed Care – PPO | Admitting: Endocrinology

## 2011-02-24 DIAGNOSIS — E210 Primary hyperparathyroidism: Secondary | ICD-10-CM

## 2011-02-25 ENCOUNTER — Other Ambulatory Visit: Payer: Self-pay | Admitting: Endocrinology

## 2011-02-25 ENCOUNTER — Other Ambulatory Visit: Payer: BC Managed Care – PPO

## 2011-02-25 ENCOUNTER — Ambulatory Visit
Admission: RE | Admit: 2011-02-25 | Discharge: 2011-02-25 | Disposition: A | Discharge status: Home / Self Care | Payer: BC Managed Care – PPO | Source: Ambulatory Visit | Attending: Endocrinology | Admitting: Endocrinology

## 2011-02-25 ENCOUNTER — Encounter: Payer: Self-pay | Admitting: Endocrinology

## 2011-02-25 ENCOUNTER — Ambulatory Visit (INDEPENDENT_AMBULATORY_CARE_PROVIDER_SITE_OTHER)
Admission: RE | Admit: 2011-02-25 | Discharge: 2011-02-25 | Disposition: A | Discharge status: Home / Self Care | Payer: BC Managed Care – PPO | Source: Ambulatory Visit | Attending: Endocrinology | Admitting: Endocrinology

## 2011-02-25 DIAGNOSIS — E210 Primary hyperparathyroidism: Secondary | ICD-10-CM

## 2011-03-01 ENCOUNTER — Other Ambulatory Visit: Payer: Self-pay

## 2011-03-07 ENCOUNTER — Encounter: Payer: Self-pay | Admitting: Internal Medicine

## 2011-03-08 NOTE — Assessment & Plan Note (Signed)
Summary: NEW ENDO/ THYROID /  [employer] [facility]ss[facility] BY DR LESCHBER/NWS   Vital Signs:  Patient profile:   60 year old female Weight:      194.8 pounds (88.55 kg) O2 Sat:      98 % on Room air Temp:     98.7 degrees F (37.06 degrees C) oral Pulse rate:   78 / minute BP sitting:   132 / 80  (left arm) Cuff size:   large  Vitals Entered By: Orlan Leavens RMA (February 24, 2011 2:04 PM)  O2 Flow:  Room air CC: New Endo/Hyperparathyroid/ Ref by Dr. Felicity Coyer Is Patient Diabetic? No Pain Assessment Patient in pain? no        Visit Type:  Consult Referring Provider:  Dr. Felicity Coyer Primary Provider:  Newt Lukes MD  CC:  New Endo/Hyperparathyroid/ Ref by Dr. Felicity Coyer.  History of Present Illness: pt was noted to have borderline hypercalcemia in 2010.  she has no h/o bony fracture, but there was a ? of urolithiasis. symptomatically, pt states 1 month of moderate intermittent bilat flank pain, but no assoc n/v.    Current Medications (verified): 1)  Simvastatin 20 Mg Tabs (Simvastatin) .... Take 1 By Mouth At Bedtime 2)  Pantoprazole Sodium 20 Mg Tbec (Pantoprazole Sodium) .Marland Kitchen.. 1 By Mouth Once Daily  Allergies (verified): No Known Drug Allergies  Family History: Reviewed history from 10/01/2010 and no changes required. History of coronary artery disease in her family with her father having PCI at age 78. positive family history for lung cancer No FH of Colon Cancer:   neg for parathyroid problem  Social History: Reviewed history from 10/01/2010 and no changes required. Single, has two children.  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  she had a neg w/u of hematuria. she has intermittent menopausal sxs.   she has intermittent numbness of the hands.  she has alternating constipation and diarrhea.  she has easy bruising, and a few arthralgias.    Physical Exam  General:  Well developed, well nourished, in no acute distress.  Head:  head: no deformity eyes: no periorbital swelling, no proptosis external nose and ears are normal mouth: no lesion seen Neck:  Supple without thyroid enlargement or tenderness.  Chest Wall:  no kyphosis Lungs:  Clear to auscultation bilaterally. Normal respiratory effort.  Heart:  Regular rate and rhythm without murmurs or gallops noted. Normal S1,S2.   Abdomen:  abdomen is soft, nontender.  no hepatosplenomegaly.   not distended.  no hernia  Msk:  muscle bulk and strength are grossly normal.  no obvious joint swelling.  gait is normal and steady  Pulses:  dorsalis pedis intact bilat.  Extremities:  no deformity.  no ulcer on the feet.  feet are of normal color and temp.  no edema Neurologic:  cn 2-12 grossly intact.   readily moves all 4's.   sensation is intact to touch on the feet  Skin:  normal texture and temp.  no rash.  not diaphoretic  Cervical Nodes:  No significant adenopathy.  Psych:  Alert and cooperative; normal mood and affect; normal attention span and concentration.     Impression & Recommendations:  Problem # 1:  PRIMARY HYPERPARATHYROIDISM (ICD-252.01)  Calcium: 10.8 (01/19/2011)   PTH-Intact: 182.9 (01/24/2011) the fact that the pth is elevated out of proportion suggests that she may have some element of secondary hyperparathyroidism also.  Problem # 2:  HYPERCALCEMIA (ICD-275.42) due to #1  Problem # 3:  alternating constip and diarrhea uncertain if there is any relationship to #1  Other Orders: T-Bone Densitometry (21308) T-Lumbar Vertebral Assessment (65784) Consultation Level IV (69629)  Patient Instructions: 1)  check "bone density test" (osteoporosis x ray).  unless there is severe osteoporosis, you can return here in 6  months.     Orders Added: 1)  T-Bone Densitometry [77080] 2)  T-Lumbar Vertebral Assessment [77082] 3)  Consultation Level IV [52841]

## 2011-03-24 ENCOUNTER — Emergency Department (HOSPITAL_COMMUNITY)
Admission: EM | Admit: 2011-03-24 | Discharge: 2011-03-24 | Disposition: A | Discharge status: Home / Self Care | Payer: BC Managed Care – PPO | Attending: Emergency Medicine | Admitting: Emergency Medicine

## 2011-03-24 DIAGNOSIS — R42 Dizziness and giddiness: Secondary | ICD-10-CM | POA: Insufficient documentation

## 2011-03-24 DIAGNOSIS — K219 Gastro-esophageal reflux disease without esophagitis: Secondary | ICD-10-CM | POA: Insufficient documentation

## 2011-03-24 DIAGNOSIS — I951 Orthostatic hypotension: Secondary | ICD-10-CM | POA: Insufficient documentation

## 2011-03-24 DIAGNOSIS — E860 Dehydration: Secondary | ICD-10-CM | POA: Insufficient documentation

## 2011-03-24 DIAGNOSIS — E7800 Pure hypercholesterolemia, unspecified: Secondary | ICD-10-CM | POA: Insufficient documentation

## 2011-03-24 LAB — CBC
HCT: 37.8 % (ref 36.0–46.0)
MCH: 26 pg (ref 26.0–34.0)
Platelets: 250 10*3/uL (ref 150–400)
RBC: 4.57 MIL/uL (ref 3.87–5.11)

## 2011-03-24 LAB — BASIC METABOLIC PANEL
BUN: 13 mg/dL (ref 6–23)
Chloride: 105 mEq/L (ref 96–112)
Creatinine, Ser: 0.8 mg/dL (ref 0.4–1.2)
Glucose, Bld: 105 mg/dL — ABNORMAL HIGH (ref 70–99)
Sodium: 138 mEq/L (ref 135–145)

## 2011-03-25 ENCOUNTER — Encounter: Payer: Self-pay | Admitting: Internal Medicine

## 2011-03-25 ENCOUNTER — Ambulatory Visit: Payer: BC Managed Care – PPO | Admitting: *Deleted

## 2011-03-25 VITALS — BP 138/82

## 2011-03-25 DIAGNOSIS — R030 Elevated blood-pressure reading, without diagnosis of hypertension: Secondary | ICD-10-CM

## 2011-03-28 ENCOUNTER — Ambulatory Visit (INDEPENDENT_AMBULATORY_CARE_PROVIDER_SITE_OTHER): Payer: BC Managed Care – PPO | Admitting: Internal Medicine

## 2011-03-28 ENCOUNTER — Encounter: Payer: Self-pay | Admitting: Internal Medicine

## 2011-03-28 VITALS — BP 130/72 | HR 73 | Temp 98.3°F | Ht 61.0 in | Wt 195.1 lb

## 2011-03-28 DIAGNOSIS — R42 Dizziness and giddiness: Secondary | ICD-10-CM | POA: Insufficient documentation

## 2011-03-28 MED ORDER — MECLIZINE HCL 12.5 MG PO TABS: 12.5000 mg | ORAL_TABLET | Freq: Three times a day (TID) | ORAL | Status: DC | PRN

## 2011-03-28 NOTE — Progress Notes (Signed)
  Subjective:    Patient ID: Sarah Santos, female    DOB: [birth year], 60 y.o.   MRN: [id number]  HPI Here for ER follow up Seen 3/29 for dizzy symptoms - dx with dehydration and given IVF Symptom onset was acute and sudden for a time period of 5 day(s). Severity is described as severe initially, now mild. Course of her symptoms over time has nearly resolved. Worse with position change. No headache or vision change.  also review other med issues dyslipidemia - reports compliance with ongoing medical treatment and no changes in medication dose or frequency. denies adverse side effects related to current therapy. no myalgias   OA/bursitis r hip - reports compliance with ongoing medical treatment (otc tylenol) and no changes in medication dose or frequency. denies adverse side effects related to current therapy.  no swelling in joints  Mild hyperparathyroid - dx 01/2011 on routine labs - has seen endo for same  Past Medical History  Diagnosis Date  . HYPERLIPIDEMIA 05/08/2009  . ALLERGIC RHINITIS 05/08/2009  . BURSITIS, RIGHT HIP 10/02/2009  . HEADACHE, CHRONIC 05/08/2009  . Primary hyperparathyroidism 01/25/2011  . Hypercalcemia 01/21/2011  . GERD 01/19/2011  . Hx of adenomatous colonic polyps   . Hematuria, microscopic    Review of Systems  Respiratory: Negative for cough.   Cardiovascular: Negative for chest pain.  Musculoskeletal: Negative for gait problem.  Neurological: Positive for light-headedness. Negative for tremors, syncope and weakness.      Objective:   Physical Exam BP 130/72  Pulse 73  Temp(Src) 98.3 F (36.8 C) (Oral)  Ht 5\' 1"  (1.549 m)  Wt 195 lb 1.9 oz (88.506 kg)  BMI 36.87 kg/m2 Physical Exam  Constitutional: She is oriented to person, place, and time. She appears well-developed and well-nourished. No distress.  HENT: Head: Normocephalic and atraumatic. Nose: Nose normal. Mouth/Throat: Oropharynx is clear and moist. No oropharyngeal exudate.    Eyes: Conjunctivae and EOM are normal. Pupils are equal, round, and reactive to light. No scleral icterus. No nystagmus Neck: Normal range of motion. Neck supple. No JVD present. No thyromegaly present.  Cardiovascular: Normal rate, regular rhythm and normal heart sounds.  No murmur heard. Pulmonary/Chest: Effort normal and breath sounds normal. No respiratory distress. She has no wheezes.  Neurological: She is alert and oriented to person, place, and time. No cranial nerve deficit. Coordination normal.      Lab Results  Component Value Date   WBC 6.3 03/24/2011   HGB 11.9* 03/24/2011   HCT 37.8 03/24/2011   PLT 250 03/24/2011   CHOL 217* 01/19/2011   TRIG 125.0 01/19/2011   HDL 64.30 01/19/2011   LDLDIRECT 136.2 01/19/2011   ALT 19 01/19/2011   AST 18 01/19/2011   NA 138 03/24/2011   K 4.0 03/24/2011   CL 105 03/24/2011   CREATININE 0.80 03/24/2011   BUN 13 03/24/2011   CO2 27 03/24/2011   TSH 0.70 01/19/2011   HGBA1C 5.9 11/23/2009   Assessment & Plan:  See problem list. Medications and labs reviewed today.

## 2011-03-28 NOTE — Patient Instructions (Signed)
It was good to see you today. ER records and visit reviewed Stay hydrated and use meclizine as needed for dizzy symptoms  Your prescription(s) have been submitted to your pharmacy. Please take as directed and contact our office if you believe you are having problem(s) with the medication(s). Medications reviewed, no other changes at this time. Please schedule followup in 3-4 months for blood pressure and cholesterol review, call sooner if problems.

## 2011-03-29 NOTE — Assessment & Plan Note (Signed)
ER visit and labs reviewed - suspect BPPV symptoms - meclizine erx done Call if symptoms worse or unimproved (already better than onset)

## 2011-03-29 NOTE — Assessment & Plan Note (Signed)
Mild, asymptomatic - reminded to maintain hydration Related to hyperparathyroidism and has seen endo for same Lab Results  Component Value Date   CALCIUM 10.5 03/24/2011

## 2011-04-25 ENCOUNTER — Ambulatory Visit: Payer: BC Managed Care – PPO | Admitting: Internal Medicine

## 2011-05-10 NOTE — Assessment & Plan Note (Signed)
[facility]                         GASTROENTEROLOGY OFFICE NOTE   NAME:TRUEITT-JOHNSONMayzee, Reichenbach             MRN:          [id number]  DATE:12/14/2007                            DOB:          14-Aug-1951    CHIEF COMPLAINT:  Blood in stool one week ago.   HISTORY:  A 60 year old African American woman who went to urgent care  because she started seeing some bright red blood pre rectum.  They  thought she had a tear on evaluation.  She was given Anusol cream and  suppositories.  The bleeding has stopped.  There has never been a  history of this before.  No melena.  No significant rectal pain.  She  did have a history of hemorrhoids during pregnancy and childbirth.  She  has never had a colonoscopy.   GI review of systems also notable for occasional heartburn, nausea.  She  has had occasional left and right lower quadrant epigastric pain and gas  and bloating after some foods.   PAST MEDICAL HISTORY:  1. Dyslipidemia.  2. Allergies and sinus problems.  3. Chronic headaches.  4. Prior tubal ligation.   MEDICATIONS:  None at this time.  She has stopped using the  suppositories and cream.   No known drug allergies.   FAMILY HISTORY:  Diabetes and heart disease.   SOCIAL HISTORY:  She is divorced.  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.  No alcohol, tobacco, or drugs.   REVIEW OF SYSTEMS:  See my medical history form for full details.   PHYSICAL EXAMINATION:  GENERAL:  Obese, pleasant, middle-aged, black  woman in no acute distress.  VITAL SIGNS:  Weight 195 pounds, height 5 feet 2 inches, blood pressure  120/82, pulse 80 regular in the left arm sitting.  EYES:  Anicteric.  ENT:  Normal mouth posterior oropharynx.  NECK:  Supple.  No thyromegaly.  CHEST:  Clear.  HEART:  S1 S2.  No murmurs, rubs, or gallops.  ABDOMEN:  Soft, obese, nontender.  No organomegaly or mass.  RECTAL:  Deferred.  LYMPHATIC:  No neck or  supraclavicular nodes.  EXTREMITIES:  Trace peripheral edema bilaterally.  SKIN:  Warm and dry.  No acute rash.  PSYCH:  She is alert and oriented x3.   ASSESSMENT:  She has had some blood in the stool versus rectal bleeding.  Certainly, it could be hemorrhoidal.  At 56 and without a colonoscopy,  she should have one.   PLAN:  Schedule colonoscopy to investigate the bleeding to rule out more  serious causes and to look for any polyps that may be present as well.  She understands the risks, benefits and indications and agrees to  proceed.     Iva Boop, MD,FACG  Electronically Signed    CEG/MedQ  DD: 12/15/2007  DT: 12/16/2007  Job #: [id number]

## 2011-05-10 NOTE — Assessment & Plan Note (Signed)
[facility]                         GASTROENTEROLOGY OFFICE NOTE   NAME:TRUEITT-JOHNSONKamden, Reber             MRN:          [id number]  DATE:02/18/2008                            DOB:          Nov 21, 1951    CHIEF COMPLAINT:  Ms. Laural Benes is here to discussion the results of her  colonoscopy.   She had a screening colonoscopy on January 15, 2008. She had 4 polyps  removed. One was a 4 cm tubular adenoma. 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 It was completely removed.   I wanted to explain to her what a carcinoid tumor was. We went over that  today and I gave her some literature. I explained to her that I think  her's is likely benign but it is a bit different than a typical polyp.  It appears to be completely removed based upon the pathology. I plan for  a repeat colonoscopy in 1 year to followup the polyps and the carcinoid  tumor. She was warned about carcinoid syndrome and the possible symptoms  and signs of that and to report any of those if they occur. She was  complaining of some increasing gas since her colonoscopy but she has  increased fiber in her diet and I told her that she may not really need  to do that if she is moving her bowels regularly. There is also some  occasional nausea, which she thinks might be reflux. She will monitor  those symptoms and if they are persistent and do not respond to simple  measures as we discussed, she will schedule a followup appointment with  me.     Iva Boop, MD,FACG  Electronically Signed    CEG/MedQ  DD: 02/18/2008  DT: 02/19/2008  Job #: [id number]   cc:   Corwin Levins, MD

## 2011-05-10 NOTE — Assessment & Plan Note (Signed)
[facility]                            CARDIOLOGY OFFICE NOTE   Sarah Santos, Sarah Santos             MRN:          [id number]  DATE:07/10/2008                            DOB:          16-Aug-1951    Sarah Santos is a 60 year old single [place] American female who  is referred by Dr. Marcelle Overlie for consultation concerning a mixed  hyperlipidemia.   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.   She is having no symptoms of coronary ischemia.  She tries to walk about  2 hours a week, but certainly less than 3 hours.   Her last cholesterol was 274, triglycerides were 171, HDL was 56, her  LDL was 184, her total cholesterol HDL ratio was 4.9, VLDL was 34, which  is normal.  Her fasting blood sugar was normal.  TSH was normal.   She does not smoke.  She quit 20 years ago.  She does not drink alcohol.  She does not drink excess caffeine.   PAST MEDICAL HISTORY:  She has no history of hypertension or diabetes.  She does have a history of coronary artery disease in her family with  her father having PCI at age 89.   ALLERGIES:  She has no known drug allergies.  She has no dye allergies.   MEDICATIONS:  She is currently on no meds.   PAST SURGICAL HISTORY:  She has no surgical history.   SOCIAL HISTORY:  She is single, has two children.  She is a  Armed forces logistics/support/administrative officer.   REVIEW OF SYSTEMS:  She has history of seasonal allergies, chronic  anemia, and reflux.  Other review of systems were negative.   PHYSICAL EXAMINATION:  GENERAL:  She is 5 feet tall and weighs 190  pounds.  Very pleasant.  Alert and oriented x3.  VITAL SIGNS:  Blood pressure is 126/82, pulse is 66 and regular.  Weight  is 193 on our scales.  HEENT:  Normocephalic, atraumatic.  PERRLA.  Extraocular movements  intact.  Sclerae slightly muddy.  Facial symmetry is normal.  Dentition  is  satisfactory.  NECK:  Supple.  Carotids upstroke are equal bilateral without bruits.  No JVD.  Thyroid is not enlarged.  Trachea is midline.  LUNGS:  Clear.  HEART:  Nondisplaced PMI.  She has a soft S1, S2.  ABDOMEN:  Soft.  Good bowel sounds.  No obvious midline or bruit.  There  is no obvious hepatomegaly.  EXTREMITIES:  No cyanosis, clubbing, or edema.  Pulses are brisk.  NEURO:  Intact.  Skin is unremarkable.   Her electrocardiogram shows sinus rhythm, low-voltage.   ASSESSMENT/PLAN:  Sarah Santos is at, in my opinion, moderate  risk for having acute coronary event or vascular event in the next 10  years based on her Reynold's score.  I have advised her to go on a  statin in the form of Crestor 20 mg q.h.s. with an LDL goal of less than  100.  I have cited a Jupiter trial and other studies that she can read  on  the internet.  She is worried about muscle damage or side effects.  I  have reassured that this is very rare.   After a long discussion, she has decided to go on Crestor 20 mg q.h.s.  We will check lipids and LFTs in about 6 weeks.  I have advised her to  increase her walking to 3 hours or more a week.  I will follow with her  in 3 months.     Thomas C. Daleen Squibb, MD, [facility]  Electronically Signed    TCW/MedQ  DD: 07/10/2008  DT: 07/11/2008  Job #: [id number]   cc:   Zelphia Cairo, MD

## 2011-05-24 ENCOUNTER — Encounter: Payer: Self-pay | Admitting: Internal Medicine

## 2011-06-17 ENCOUNTER — Encounter: Payer: Self-pay | Admitting: Internal Medicine

## 2011-08-05 ENCOUNTER — Ambulatory Visit (AMBULATORY_SURGERY_CENTER): Payer: BC Managed Care – PPO | Admitting: *Deleted

## 2011-08-05 VITALS — Ht 61.0 in | Wt 194.6 lb

## 2011-08-05 DIAGNOSIS — Z1211 Encounter for screening for malignant neoplasm of colon: Secondary | ICD-10-CM

## 2011-08-05 MED ORDER — PEG-KCL-NACL-NASULF-NA ASC-C 100 G PO SOLR: ORAL | Status: DC

## 2011-08-08 ENCOUNTER — Encounter: Payer: Self-pay | Admitting: Internal Medicine

## 2011-08-19 ENCOUNTER — Ambulatory Visit (AMBULATORY_SURGERY_CENTER): Payer: BC Managed Care – PPO | Admitting: Internal Medicine

## 2011-08-19 ENCOUNTER — Encounter: Payer: Self-pay | Admitting: Internal Medicine

## 2011-08-19 VITALS — BP 153/75 | HR 76 | Temp 98.1°F | Resp 20 | Ht 61.0 in | Wt 194.0 lb

## 2011-08-19 DIAGNOSIS — Z1211 Encounter for screening for malignant neoplasm of colon: Secondary | ICD-10-CM

## 2011-08-19 DIAGNOSIS — Z86010 Personal history of colonic polyps: Secondary | ICD-10-CM

## 2011-08-19 MED ORDER — SODIUM CHLORIDE 0.9 % IV SOLN: 500.0000 mL | INTRAVENOUS | Status: DC

## 2011-08-19 NOTE — Progress Notes (Signed)
Pressure applied to abdomen to reach cecum.  

## 2011-08-19 NOTE — Patient Instructions (Signed)
Please refer to your blue and neon green sheets for instructions regarding diet and activity for the rest of today.  You may resume your medications as you would normally take them.  Repeat exam in 5 years (2017)

## 2011-08-22 ENCOUNTER — Telehealth: Payer: Self-pay | Admitting: *Deleted

## 2011-08-22 NOTE — Telephone Encounter (Signed)
Follow up Call- Patient questions:  Do you have a fever, pain , or abdominal swelling? no Pain Score  0 *  Have you tolerated food without any problems? yes  Have you been able to return to your normal activities? yes  Do you have any questions about your discharge instructions: Diet   no Medications  no Follow up visit  no  Do you have questions or concerns about your Care? no  Actions: * If pain score is 4 or above: No action needed, pain <4. Pt states she has a dry cough. It was slight on Friday but has increased slightly over the weekend. Pt states she has some post nasal drainage she thinks is irritating her throat and causing the cough. Instructed pt she can try over the counter cough meds and if worsens or progresses she needs to contact her primary care provider in re guards to this cough. She has no fever or other s/s, just this cough. Eating and drinking fine. No other problems or concerns. EWM

## 2011-08-23 ENCOUNTER — Other Ambulatory Visit: Payer: Self-pay | Admitting: Internal Medicine

## 2011-08-26 ENCOUNTER — Other Ambulatory Visit (INDEPENDENT_AMBULATORY_CARE_PROVIDER_SITE_OTHER): Payer: BC Managed Care – PPO

## 2011-08-26 ENCOUNTER — Ambulatory Visit (INDEPENDENT_AMBULATORY_CARE_PROVIDER_SITE_OTHER): Payer: BC Managed Care – PPO | Admitting: Endocrinology

## 2011-08-26 DIAGNOSIS — E210 Primary hyperparathyroidism: Secondary | ICD-10-CM

## 2011-08-26 MED ORDER — AZITHROMYCIN 500 MG PO TABS: 500.0000 mg | ORAL_TABLET | Freq: Every day | ORAL | Status: AC

## 2011-08-26 NOTE — Progress Notes (Signed)
  Subjective:    Patient ID: Sarah Santos, female    DOB: May 02, 1951, 60 y.o.   MRN: [id number]  HPI Pt states 10 days of dry-quality cough in the chest, and assoc slight wheezing.   She also has night sweats x a few mos. Pt has primary hyperparathyroidism.  She has not needed surgery yet. Past Medical History  Diagnosis Date  . HYPERLIPIDEMIA 05/08/2009  . ALLERGIC RHINITIS 05/08/2009  . BURSITIS, RIGHT HIP 10/02/2009  . HEADACHE, CHRONIC 05/08/2009  . Primary hyperparathyroidism 01/25/2011  . Hypercalcemia 01/21/2011  . GERD 01/19/2011  . Hx of adenomatous colonic polyps   . Hematuria, microscopic   . 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 Tubal ligation   . Colonoscopy w/ polypectomy 2009, 08/18/11    2009:adenomas, largest 4 cm, rectal carcinoid, hemorrhoids    History   Social History  . Marital Status: Single    Spouse Name: N/A    Number of Children: N/A  . Years of Education: N/A   Occupational History  . Not on file.   Social History Main Topics  . Smoking status: Former Smoker    Quit date: 03/03/1991  . Smokeless tobacco: Not on file  . Alcohol Use: No  . Drug Use: No  . Sexually Active: Not on file   Other Topics Concern  . 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 clotrimazole-betamethasone (LOTRISONE) cream       . meclizine (ANTIVERT) 12.5 MG tablet Take 1 tablet (12.5 mg total) by mouth 3 (three) times daily as needed for dizziness or nausea.  30 tablet  0  . pantoprazole (PROTONIX) 20 MG tablet Take 20 mg by mouth daily as needed.       . simvastatin (ZOCOR) 20 MG tablet TAKE 1 TABLET BY MOUTH AT BEDTIME  30 tablet  5    No Known Allergies  Family History  Problem Relation Age of Onset  . Heart disease Mother 64    PCI    . Cancer Other     Lung  . Heart disease Other     Coronary Atery Disease  . Colon cancer Neg Hx   . Colon polyps Neg Hx     There were no vitals taken for this visit.    Review of Systems Denies fever and cramps.    Objective:   Physical Exam VITAL SIGNS:  See vs page GENERAL: no distress head: no deformity eyes: no periorbital swelling, no proptosis external nose and ears are normal mouth: no lesion seen right eac and tm are normal.  Left eac is normal, but the tm is slightly red. LUNGS:  Clear to auscultation, except for a few wheezes.   PTH=121 Ca++=11 25-OH-vit-d=24    Assessment & Plan:  Primary hyperparathyroidism, unchanged vitamin-d deficiency, needs increased rx Diaphoresis, not thyroid-related Acute bronchitis, new.

## 2011-08-26 NOTE — Patient Instructions (Addendum)
blood tests are being requested for you today.  please call [phone] to hear your test results.  You will be prompted to enter the 9-digit "MRN" number that appears at the top left of this page, followed by #.  Then you will hear the message. i have sent a prescription to your pharmacy, for an antibiotic. here is a sample of "advair-100."  take 1 puff 2x a day.  rinse mouth after using. I hope you feel better soon.  If you don't feel better by next week, please call doctor leschber.   (update: i left message on phone-tree:  rx as we discussed.  Please make a follow-up appointment in 6 months)

## 2011-08-29 LAB — PTH, INTACT AND CALCIUM
Calcium, Total (PTH): 11 mg/dL — ABNORMAL HIGH (ref 8.4–10.5)
PTH: 121.3 pg/mL — ABNORMAL HIGH (ref 14.0–72.0)

## 2011-10-03 LAB — OCCULT BLOOD X 1 CARD TO LAB, STOOL: Fecal Occult Bld: POSITIVE

## 2011-10-03 LAB — I-STAT 8, (EC8 V) (CONVERTED LAB)
Acid-Base Excess: 1
Chloride: 107
Glucose, Bld: 93
Potassium: 3.8
pH, Ven: 7.352 — ABNORMAL HIGH

## 2011-10-03 LAB — POCT I-STAT CREATININE: Operator id: 247071

## 2011-10-20 ENCOUNTER — Other Ambulatory Visit: Payer: Self-pay | Admitting: Internal Medicine

## 2011-10-24 ENCOUNTER — Encounter: Payer: Self-pay | Admitting: Internal Medicine

## 2011-10-24 ENCOUNTER — Ambulatory Visit (INDEPENDENT_AMBULATORY_CARE_PROVIDER_SITE_OTHER): Payer: BC Managed Care – PPO | Admitting: Internal Medicine

## 2011-10-24 VITALS — BP 120/72 | HR 77 | Temp 97.0°F | Ht 62.0 in

## 2011-10-24 DIAGNOSIS — R599 Enlarged lymph nodes, unspecified: Secondary | ICD-10-CM

## 2011-10-24 DIAGNOSIS — R590 Localized enlarged lymph nodes: Secondary | ICD-10-CM

## 2011-10-24 DIAGNOSIS — E210 Primary hyperparathyroidism: Secondary | ICD-10-CM

## 2011-10-24 DIAGNOSIS — J069 Acute upper respiratory infection, unspecified: Secondary | ICD-10-CM

## 2011-10-24 NOTE — Progress Notes (Signed)
  Subjective:    Patient ID: Sarah Santos, female    DOB: 02/20/1951, 60 y.o.   MRN: [id number]  HPI  complains of soreness anterior neck Onset 3 days ago Recent sore throat x 3 days (but resolved this AM) no other swelling, no fever or fatigue  Past Medical History  Diagnosis Date  . HYPERLIPIDEMIA   . ALLERGIC RHINITIS   . HEADACHE, CHRONIC   . Primary hyperparathyroidism   . Hypercalcemia   . GERD   . Hx of adenomatous colonic polyps   . Hematuria, microscopic   . 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  HENT: Negative for ear pain and facial swelling.   Respiratory: Negative for shortness of breath and stridor.        Objective:   Physical Exam BP 120/72  Pulse 77  Temp(Src) 97 F (36.1 C) (Oral)  Ht 5\' 2"  (1.575 m)  SpO2 98% Wt Readings from Last 3 Encounters:  08/19/11 194 lb (87.998 kg)  08/05/11 194 lb 9.6 oz (88.27 kg)  03/28/11 195 lb 1.9 oz (88.506 kg)   Constitutional: She appears well-developed and well-nourished. No distress.  HENT: Head: Normocephalic and atraumatic. Ears: B TMs ok, no erythema or effusion; Nose: Nose normal.  Mouth/Throat: Oropharynx is clear and moist. No oropharyngeal exudate.  Eyes: Conjunctivae and EOM are normal. Pupils are equal, round, and reactive to light. No scleral icterus.  Neck: mild L LAD -Normal range of motion. Neck supple. No JVD present. No thyromegaly present or mass or nodules.  Cardiovascular: Normal rate, regular rhythm and normal heart sounds.  No murmur heard. No BLE edema. Pulmonary/Chest: Effort normal and breath sounds normal. No respiratory distress. She has no wheezes. Skin: Skin is warm and dry. No rash noted. No erythema.  Psychiatric: She has a normal mood and affect. Her behavior is normal. Judgment and thought content normal.   Lab Results  Component Value Date   WBC 6.3  03/24/2011   HGB 11.9* 03/24/2011   HCT 37.8 03/24/2011   PLT 250 03/24/2011   GLUCOSE 105* 03/24/2011   CHOL 217* 01/19/2011   TRIG 125.0 01/19/2011   HDL 64.30 01/19/2011   LDLDIRECT 136.2 01/19/2011   LDLCALC 104* 01/13/2010   ALT 19 01/19/2011   AST 18 01/19/2011   NA 138 03/24/2011   K 4.0 03/24/2011   CL 105 03/24/2011   CREATININE 0.80 03/24/2011   BUN 13 03/24/2011   CO2 27 03/24/2011   TSH 0.90 08/26/2011   HGBA1C 5.9 11/23/2009   Lab Results  Component Value Date   PTH 121.3* 08/26/2011   CALCIUM 11.0* 08/26/2011      Assessment & Plan:  Viral infection - URI symptoms with mild  L LAD - improved in last 48h - reassurance provided - ibuprofen advised and to call if worse

## 2011-10-24 NOTE — Patient Instructions (Signed)
It was good to see you today. The swelling in your neck is likely related to a viral infection and will get better with time If you develop worsening symptoms or fever, call and we can reconsider antibiotics, but it does not appear necessary to use antibiotics at this time. Use ibuprofen over-the-counter as needed for symptoms as discussed

## 2011-10-24 NOTE — Assessment & Plan Note (Signed)
Mild and asymptomatic - recent endo eval and labs reviewed The current medical regimen is effective;  continue present plan and medications.  Lab Results  Component Value Date   PTH 121.3* 08/26/2011   CALCIUM 11.0* 08/26/2011

## 2012-01-18 ENCOUNTER — Telehealth: Payer: Self-pay | Admitting: *Deleted

## 2012-01-18 DIAGNOSIS — Z0000 Encounter for general adult medical examination without abnormal findings: Secondary | ICD-10-CM

## 2012-01-18 NOTE — Telephone Encounter (Signed)
Message copied by Deatra James on Wed Jan 18, 2012  4:10 PM ------      Message from: Earl Lagos      Created: Wed Jan 18, 2012  3:21 PM      Regarding: lab       Please schedule labs for cpx 03/07/12. Thanks

## 2012-01-18 NOTE — Telephone Encounter (Signed)
Received staff msg pt made cpx appt need labs entered in EPIC....01/18/12@4 :10pm/LMB

## 2012-02-01 ENCOUNTER — Encounter: Payer: Self-pay | Admitting: Internal Medicine

## 2012-02-01 ENCOUNTER — Other Ambulatory Visit (INDEPENDENT_AMBULATORY_CARE_PROVIDER_SITE_OTHER): Payer: BC Managed Care – PPO

## 2012-02-01 ENCOUNTER — Ambulatory Visit (INDEPENDENT_AMBULATORY_CARE_PROVIDER_SITE_OTHER): Payer: BC Managed Care – PPO | Admitting: Internal Medicine

## 2012-02-01 DIAGNOSIS — R002 Palpitations: Secondary | ICD-10-CM

## 2012-02-01 DIAGNOSIS — R51 Headache: Secondary | ICD-10-CM

## 2012-02-01 DIAGNOSIS — E785 Hyperlipidemia, unspecified: Secondary | ICD-10-CM

## 2012-02-01 DIAGNOSIS — Z0000 Encounter for general adult medical examination without abnormal findings: Secondary | ICD-10-CM

## 2012-02-01 LAB — CBC WITH DIFFERENTIAL/PLATELET
Basophils Absolute: 0 10*3/uL (ref 0.0–0.1)
Hemoglobin: 12.3 g/dL (ref 12.0–15.0)
Lymphocytes Relative: 47 % — ABNORMAL HIGH (ref 12.0–46.0)
Monocytes Relative: 8.9 % (ref 3.0–12.0)
Neutro Abs: 3 10*3/uL (ref 1.4–7.7)
Neutrophils Relative %: 41.8 % — ABNORMAL LOW (ref 43.0–77.0)
Platelets: 268 10*3/uL (ref 150.0–400.0)
RDW: 13.6 % (ref 11.5–14.6)

## 2012-02-01 LAB — LIPID PANEL
Cholesterol: 254 mg/dL — ABNORMAL HIGH (ref 0–200)
HDL: 64.2 mg/dL (ref >39.00)
Triglycerides: 102 mg/dL (ref 0.0–149.0)
VLDL: 20.4 mg/dL (ref 0.0–40.0)

## 2012-02-01 LAB — HEPATIC FUNCTION PANEL
ALT: 16 U/L (ref 0–35)
Albumin: 4.3 g/dL (ref 3.5–5.2)
Total Protein: 7.8 g/dL (ref 6.0–8.3)

## 2012-02-01 LAB — URINALYSIS, ROUTINE W REFLEX MICROSCOPIC
Leukocytes, UA: NEGATIVE
Specific Gravity, Urine: 1.025 (ref 1.000–1.030)
Urobilinogen, UA: 0.2 (ref 0.0–1.0)

## 2012-02-01 LAB — BASIC METABOLIC PANEL
BUN: 15 mg/dL (ref 6–23)
Calcium: 10.9 mg/dL — ABNORMAL HIGH (ref 8.4–10.5)
GFR: 102.96 mL/min (ref >60.00)
Potassium: 4.1 mEq/L (ref 3.5–5.1)

## 2012-02-01 NOTE — Assessment & Plan Note (Signed)
Exacerbation in past 1 month with diet changes (daniel fast) Check labs rule out dehydration - Clinical exam benign - Discussed possible hypertension - pt will monitor BP weekly and call if SBP>140s

## 2012-02-01 NOTE — Patient Instructions (Signed)
It was good to see you today. Test(s) ordered today. Your results will be called to you after review (48-72hours after test completion). If any changes need to be made, you will be notified at that time. medications reviewed, no changes at this time. We'll check your cholesterol in March to see if simvastatin should be resumed Continue adequate hydration, especially during diet changes with fasting Monitor your blood pressure and call us if readings are 140/85 to begin blood pressure medication

## 2012-02-01 NOTE — Assessment & Plan Note (Signed)
Mild, asymptomatic - reminded to maintain hydration Related to hyperparathyroidism and has seen endo for same Lab Results  Component Value Date   CALCIUM 11.0* 08/26/2011

## 2012-02-01 NOTE — Assessment & Plan Note (Signed)
Self stopped statin 12/2011 due to weight loss with diet changes (daniel fast) Planning upcoming CPX to check same

## 2012-02-01 NOTE — Progress Notes (Signed)
Subjective:    Patient ID: Sarah Santos, female    DOB: 1951-04-24, 61 y.o.   MRN: [id number]  Headache  This is a chronic problem. The current episode started more than 1 month ago. The problem occurs intermittently. The problem has been waxing and waning. The pain is located in the bilateral region. The pain does not radiate. The pain quality is similar to prior headaches. The quality of the pain is described as aching and dull. The pain is at a severity of 2/10. Pertinent negatives include no abdominal pain, anorexia, coughing, dizziness, eye pain, fever, hearing loss, insomnia, numbness, seizures, sinus pressure, sore throat, weakness or weight loss. The symptoms are aggravated by hunger. She has tried acetaminophen for the symptoms. The treatment provided mild relief. There is no history of cancer, obesity or recent head traumas.    also review other med issues dyslipidemia - reports non compliance with prescribed statin since starting new diet "Reuel Boom fast) 12/2011 - denies adverse side effects related to prior therapy.   OA/bursitis r hip - reports compliance with ongoing medical treatment (otc tylenol) and no changes in medication dose or frequency. denies adverse side effects related to current therapy.  no swelling in joints  Mild hyperparathyroid - dx 01/2011 on routine labs - has seen endo for same   Past Medical History  Diagnosis Date  . HYPERLIPIDEMIA   . ALLERGIC RHINITIS   . HEADACHE, CHRONIC   . Primary hyperparathyroidism   . Hypercalcemia   . GERD   . Hx of adenomatous colonic polyps   . Hematuria, microscopic   . 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  HENT: Negative for hearing loss, sore throat and sinus pressure.   Eyes: Negative for pain.  Respiratory: Negative for cough.   Cardiovascular: Positive for palpitations (occassional, 1-2x/month, no  chest tightness or SOB - lasts <3 min). Negative for chest pain.  Gastrointestinal: Negative for abdominal pain and anorexia.  Musculoskeletal: Negative for gait problem.  Neurological: Positive for light-headedness and headaches. Negative for dizziness, tremors, seizures, syncope, weakness and numbness.  Psychiatric/Behavioral: The patient does not have insomnia.       Objective:   Physical Exam  BP 130/80  Pulse 75  Temp(Src) 97.5 F (36.4 C) (Oral)  Wt 189 lb 12.8 oz (86.093 kg)  SpO2 98% Wt Readings from Last 3 Encounters:  02/01/12 189 lb 12.8 oz (86.093 kg)  08/19/11 194 lb (87.998 kg)  08/05/11 194 lb 9.6 oz (88.27 kg)    Constitutional: She appears well-developed and well-nourished. No distress.  HENT: Head: Normocephalic and atraumatic. Nose: Nose normal. Mouth/Throat: Oropharynx is clear and moist. No oropharyngeal exudate.  Eyes: Conjunctivae and EOM are normal. Pupils are equal, round, and reactive to light. No scleral icterus. No nystagmus Neck: Normal range of motion. Neck supple. No JVD present. No thyromegaly present.  Cardiovascular: Normal rate, regular rhythm and normal heart sounds.  No murmur heard. Pulmonary/Chest: Effort normal and breath sounds normal. No respiratory distress. She has no wheezes.  Neurological: She is alert and oriented to person, place, and time. No cranial nerve deficit. Coordination normal.      Lab Results  Component Value Date   WBC 6.3 03/24/2011   HGB 11.9* 03/24/2011   HCT 37.8 03/24/2011   PLT 250 03/24/2011   CHOL 217* 01/19/2011   TRIG 125.0 01/19/2011   HDL 64.30 01/19/2011  LDLDIRECT 136.2 01/19/2011   ALT 19 01/19/2011   AST 18 01/19/2011   NA 138 03/24/2011   K 4.0 03/24/2011   CL 105 03/24/2011   CREATININE 0.80 03/24/2011   BUN 13 03/24/2011   CO2 27 03/24/2011   TSH 0.90 08/26/2011   HGBA1C 5.9 11/23/2009   Assessment & Plan:  See problem list. Medications and labs reviewed today.  Palpitations - check labs - may be  related to dehydration and stress of "fasting" - pt also concerned with brother recently dx with [place] sickle cell -

## 2012-02-03 LAB — TSH: TSH: 0.82 u[IU]/mL (ref 0.35–5.50)

## 2012-02-29 ENCOUNTER — Other Ambulatory Visit (INDEPENDENT_AMBULATORY_CARE_PROVIDER_SITE_OTHER): Payer: BC Managed Care – PPO

## 2012-02-29 DIAGNOSIS — Z0000 Encounter for general adult medical examination without abnormal findings: Secondary | ICD-10-CM

## 2012-02-29 LAB — BASIC METABOLIC PANEL
BUN: 15 mg/dL (ref 6–23)
Chloride: 106 mEq/L (ref 96–112)
Creatinine, Ser: 0.9 mg/dL (ref 0.4–1.2)
GFR: 84.28 mL/min (ref >60.00)

## 2012-02-29 LAB — CBC WITH DIFFERENTIAL/PLATELET
Basophils Absolute: 0 10*3/uL (ref 0.0–0.1)
Basophils Relative: 0.6 % (ref 0.0–3.0)
Eosinophils Absolute: 0.1 10*3/uL (ref 0.0–0.7)
Hemoglobin: 11.8 g/dL — ABNORMAL LOW (ref 12.0–15.0)
Lymphocytes Relative: 48.2 % — ABNORMAL HIGH (ref 12.0–46.0)
MCHC: 32.2 g/dL (ref 30.0–36.0)
MCV: 83 fl (ref 78.0–100.0)
Monocytes Absolute: 0.5 10*3/uL (ref 0.1–1.0)
Neutro Abs: 2.2 10*3/uL (ref 1.4–7.7)
Neutrophils Relative %: 39.6 % — ABNORMAL LOW (ref 43.0–77.0)
RDW: 13.6 % (ref 11.5–14.6)

## 2012-02-29 LAB — TSH: TSH: 0.81 u[IU]/mL (ref 0.35–5.50)

## 2012-03-07 ENCOUNTER — Ambulatory Visit (INDEPENDENT_AMBULATORY_CARE_PROVIDER_SITE_OTHER): Payer: BC Managed Care – PPO | Admitting: Internal Medicine

## 2012-03-07 ENCOUNTER — Encounter: Payer: Self-pay | Admitting: Internal Medicine

## 2012-03-07 VITALS — BP 142/78 | HR 104 | Temp 98.4°F | Ht 62.0 in | Wt 191.0 lb

## 2012-03-07 DIAGNOSIS — E785 Hyperlipidemia, unspecified: Secondary | ICD-10-CM

## 2012-03-07 DIAGNOSIS — Z0000 Encounter for general adult medical examination without abnormal findings: Secondary | ICD-10-CM

## 2012-03-07 MED ORDER — SIMVASTATIN 20 MG PO TABS: 20.0000 mg | ORAL_TABLET | Freq: Every day | ORAL | Status: DC

## 2012-03-07 NOTE — Assessment & Plan Note (Signed)
Self stopped statin 12/2011 due to weight loss with diet changes (daniel fast) Increase in total and LDL reviewed -  Resume simva 20mg  qd now

## 2012-03-07 NOTE — Progress Notes (Signed)
Subjective:    Patient ID: Sarah Santos, female    DOB: 1951-04-21, 61 y.o.   MRN: [id number]  HPI patient is here today for annual physical. Patient feels well overall -  Past Medical History  Diagnosis Date  . HYPERLIPIDEMIA   . ALLERGIC RHINITIS   . HEADACHE, CHRONIC   . Primary hyperparathyroidism   . Hypercalcemia   . GERD   . Hx of adenomatous colonic polyps   . Hematuria, microscopic   . 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 Heart disease Mother 43    PCI  . Cancer Other     Lung  . Heart disease Other     Coronary Atery Disease  . Colon cancer Neg Hx   . Colon polyps Neg Hx    History  Substance Use Topics  . Smoking status: Former Smoker    Quit date: 03/03/1991  . Smokeless tobacco: Not on file  . Alcohol Use: No    Review of Systems Constitutional: Negative for fever or weight change.  Respiratory: Negative for cough and shortness of breath.   Cardiovascular: Negative for chest pain or palpitations.  Gastrointestinal: Negative for abdominal pain, no bowel changes.  Musculoskeletal: Negative for gait problem or joint swelling.  Skin: Negative for rash.  Neurological: Negative for dizziness or headache.  No other specific complaints in a complete review of systems (except as listed in HPI above).     Objective:   Physical Exam BP 142/78  Pulse 104  Temp(Src) 98.4 F (36.9 C) (Oral)  Ht 5\' 2"  (1.575 m)  Wt 191 lb (86.637 kg)  BMI 34.93 kg/m2  SpO2 97% Wt Readings from Last 3 Encounters:  03/07/12 191 lb (86.637 kg)  02/01/12 189 lb 12.8 oz (86.093 kg)  08/19/11 194 lb (87.998 kg)   Constitutional: She is overweight, appears well-developed and well-nourished. No distress.  HENT: Head: Normocephalic and atraumatic. Ears: B TMs ok, no erythema or effusion; Nose: Nose normal. Mouth/Throat: Oropharynx is clear and moist. No oropharyngeal exudate.  Eyes:  Conjunctivae and EOM are normal. Pupils are equal, round, and reactive to light. No scleral icterus.  Neck: Normal range of motion. Neck supple. No JVD present. No thyromegaly present.  Cardiovascular: Normal rate, regular rhythm and normal heart sounds.  No murmur heard. No BLE edema but fatty ankles Pulmonary/Chest: Effort normal and breath sounds normal. No respiratory distress. She has no wheezes.  Abdominal: Soft. Bowel sounds are normal. She exhibits no distension. There is no tenderness. no masses Musculoskeletal: Normal range of motion, no joint effusions. No gross deformities Neurological: She is alert and oriented to person, place, and time. No cranial nerve deficit. Coordination normal.  Skin: Skin is warm and dry. No rash noted. No erythema.  Psychiatric: She has a normal mood and affect. Her behavior is normal. Judgment and thought content normal.   Lab Results  Component Value Date   WBC 5.6 02/29/2012   HGB 11.8* 02/29/2012   HCT 36.8 02/29/2012   PLT 239.0 02/29/2012   GLUCOSE 84 02/29/2012   CHOL 254* 02/01/2012   TRIG 102.0 02/01/2012   HDL 64.20 02/01/2012   LDLDIRECT 156.9 02/01/2012   LDLCALC 104* 01/13/2010   ALT 16 02/01/2012   AST 16 02/01/2012   NA 140 02/29/2012   K 4.5 02/29/2012   CL 106 02/29/2012   CREATININE 0.9 02/29/2012   BUN 15 02/29/2012  CO2 28 02/29/2012   TSH 0.81 02/29/2012   HGBA1C 5.9 11/23/2009    EKG: sinus at 92bpm - unchanged from 03/24/11 - no acute ischemic changes or arrythmia     Assessment & Plan:  CPX/v70.0 - Patient has been counseled on age-appropriate routine health concerns for screening and prevention. These are reviewed and up-to-date. Immunizations are up-to-date or declined. Labs and ECG reviewed.

## 2012-03-07 NOTE — Patient Instructions (Signed)
It was good to see you today. We have reviewed your prior records including labs and tests today Health Maintenance reviewed - patient asked to schedule her pap smear, patient asked to schedule her mammogram, all other recommended screening is up to date. Consider getting the "shingles shot" Zostavax as discussed - check with your insurance regarding coverage for same Resume simvastatin 20 mg for treatment of high cholesterol - Your prescription(s) have been submitted to your pharmacy. Please take as directed and contact our office if you believe you are having problem(s) with the medication(s). Continue to work on lifestyle changes as discussed (low fat, low carb, increased protein diet; improved exercise efforts; weight loss) to control sugar, blood pressure and cholesterol levels and/or reduce risk of developing other medical problems. Look into [email or web address] or other type of food journal to assist you in this process. Please schedule followup in 6 months for cholesterol and weight check, call sooner if problems.

## 2012-04-02 ENCOUNTER — Telehealth: Payer: Self-pay | Admitting: *Deleted

## 2012-04-02 MED ORDER — VALSARTAN-HYDROCHLOROTHIAZIDE 160-25 MG PO TABS: 1.0000 | ORAL_TABLET | Freq: Every day | ORAL | Status: DC

## 2012-04-02 NOTE — Telephone Encounter (Signed)
Pt left msg on vm Friday wanted to inform md BP still reading 147/80 in the am, and evening running anywhere from 150-170/74-84. She is still having the headaches. Wanting to know what md recommendations....04/02/12@10 :57am/LMB

## 2012-04-02 NOTE — Telephone Encounter (Signed)
Start diovan hct one tab daily - erx done - OV in 2-4 weeks here to recheck BP and adjust med as needed - call sooner if problems

## 2012-04-02 NOTE — Telephone Encounter (Signed)
Called pt no answer LMOm md response & recommendations, rx already sent to [facility] in [place]... 04/02/12@12 :04am/LMB

## 2012-04-04 ENCOUNTER — Encounter: Payer: Self-pay | Admitting: Endocrinology

## 2012-04-04 ENCOUNTER — Ambulatory Visit (INDEPENDENT_AMBULATORY_CARE_PROVIDER_SITE_OTHER): Payer: BC Managed Care – PPO | Admitting: Endocrinology

## 2012-04-04 VITALS — BP 142/80 | HR 79 | Temp 98.0°F

## 2012-04-04 DIAGNOSIS — I10 Essential (primary) hypertension: Secondary | ICD-10-CM

## 2012-04-04 MED ORDER — LOSARTAN POTASSIUM-HCTZ 50-12.5 MG PO TABS: 1.0000 | ORAL_TABLET | Freq: Every day | ORAL | Status: DC

## 2012-04-04 NOTE — Patient Instructions (Addendum)
A 24-hr urine test is being requested for you today.  You will receive a letter with results. i have sent a prescription to your pharmacy, for a cheaper blood pressure medication.  Please come back for a blood-pressure recheck in 2 weeks.

## 2012-04-04 NOTE — Progress Notes (Signed)
  Subjective:    Patient ID: Sarah Santos, female    DOB: 01-06-1951, 61 y.o.   MRN: [id number]  HPI HTN.  Pt found her BP to be elevated at home, and at work.  (167/80 and 156/85).  Pt states this am, she had few minute episode of intermittent mild flushing sensation in the head, and assoc palpitations.  She did not start diovan-hct, due to cost. Past Medical History  Diagnosis Date  . HYPERLIPIDEMIA   . ALLERGIC RHINITIS   . HEADACHE, CHRONIC   . Primary hyperparathyroidism   . Hypercalcemia   . GERD   . Hx of adenomatous colonic polyps   . Hematuria, microscopic   . 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 Tubal ligation   . Colonoscopy w/ polypectomy 2009, 08/18/11    2009:adenomas, largest 4 cm, rectal carcinoid, hemorrhoids    History   Social History  . Marital Status: Single    Spouse Name: N/A    Number of Children: N/A  . Years of Education: N/A   Occupational History  . Not on file.   Social History Main Topics  . Smoking status: Former Smoker    Quit date: 03/03/1991  . Smokeless tobacco: Not on file  . Alcohol Use: No  . Drug Use: No  . Sexually Active: Not on file   Other Topics Concern  . 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 clotrimazole-betamethasone (LOTRISONE) cream       . simvastatin (ZOCOR) 20 MG tablet Take 1 tablet (20 mg total) by mouth at bedtime.  90 tablet  3  . losartan-hydrochlorothiazide (HYZAAR) 50-12.5 MG per tablet Take 1 tablet by mouth daily.  30 tablet  11    No Known Allergies  Family History  Problem Relation Age of Onset  . Heart disease Mother 71    PCI  . Cancer Other     Lung  . Heart disease Other     Coronary Atery Disease  . Colon cancer Neg Hx   . Colon polyps Neg Hx     BP  142/80  Pulse 79  Temp(Src) 98 F (36.7 C) (Oral)  SpO2 98%  Review of Systems She also had headache and diaphoresis.     Objective:   Physical Exam VITAL SIGNS:  See vs page GENERAL: no distress LUNGS:  Clear to auscultation HEART:  Regular rate and rhythm without murmurs noted. Normal S1,S2.     i reviewed electrocardiogram    Assessment & Plan:  HTN, therapy limited by noncompliance.  i'll do the best i can. Headache, and other sxs, uncertain etiology, new.  This causes high risk to her health.

## 2012-04-06 ENCOUNTER — Ambulatory Visit (INDEPENDENT_AMBULATORY_CARE_PROVIDER_SITE_OTHER): Payer: BC Managed Care – PPO | Admitting: *Deleted

## 2012-04-06 ENCOUNTER — Encounter: Payer: Self-pay | Admitting: *Deleted

## 2012-04-06 VITALS — BP 130/62 | HR 92

## 2012-04-06 DIAGNOSIS — I10 Essential (primary) hypertension: Secondary | ICD-10-CM

## 2012-04-09 ENCOUNTER — Other Ambulatory Visit: Payer: BC Managed Care – PPO

## 2012-04-09 DIAGNOSIS — I10 Essential (primary) hypertension: Secondary | ICD-10-CM

## 2012-04-10 NOTE — Progress Notes (Signed)
  Subjective:    Patient ID: Sarah Santos, female    DOB: 1951/03/09, 61 y.o.   MRN: [id number]  HPI    Review of Systems     Objective:   Physical Exam        Assessment & Plan:  Noted Rene Paci 2:03 PM 04/10/2012

## 2012-04-11 ENCOUNTER — Ambulatory Visit (INDEPENDENT_AMBULATORY_CARE_PROVIDER_SITE_OTHER): Payer: BC Managed Care – PPO | Admitting: Internal Medicine

## 2012-04-11 ENCOUNTER — Encounter: Payer: Self-pay | Admitting: Internal Medicine

## 2012-04-11 VITALS — BP 112/70 | HR 72 | Temp 98.4°F | Resp 16 | Ht 62.0 in | Wt 181.0 lb

## 2012-04-11 DIAGNOSIS — E785 Hyperlipidemia, unspecified: Secondary | ICD-10-CM

## 2012-04-11 DIAGNOSIS — R0789 Other chest pain: Secondary | ICD-10-CM

## 2012-04-11 DIAGNOSIS — I10 Essential (primary) hypertension: Secondary | ICD-10-CM

## 2012-04-11 NOTE — Assessment & Plan Note (Signed)
Self stopped statin 12/2011 due to weight loss with diet changes (daniel fast) Increase in total and LDL  01/2012 reviewed -  Resume simva 20mg  qod 02/2012 The current medical regimen is effective;  continue present plan and medications.

## 2012-04-11 NOTE — Assessment & Plan Note (Signed)
Changed to generic ARB-HCTZ 1 week ago - improved  BP Readings from Last 3 Encounters:  04/11/12 112/70  04/06/12 130/62  04/04/12 142/80   The current medical regimen is effective;  continue present plan and medications.

## 2012-04-11 NOTE — Progress Notes (Signed)
  Subjective:    Patient ID: Sarah Santos, female    DOB: 29-Jul-1951, 61 y.o.   MRN: [id number]  HPI  here for follow up blood pressure started ARB-hctz 02/2012 - changed to generic last week the patient reports compliance with medication(s) as prescribed. Denies adverse side effects.  also review other med issues dyslipidemia - resumed statin 02/2012 - also on low fat diet (Daniel fast) since 12/2011 - denies adverse side effects related to therapy.   OA/bursitis r hip -reports compliance with ongoing medical treatment (otc tylenol) and no changes in medication dose or frequency. denies adverse side effects related to current therapy.  no swelling in joints  Mild hyperparathyroid - dx 01/2011 on routine labs - has seen endo for same  Past Medical History  Diagnosis Date  . HYPERLIPIDEMIA   . ALLERGIC RHINITIS   . HEADACHE, CHRONIC   . Primary hyperparathyroidism   . Hypercalcemia   . GERD   . Hx of adenomatous colonic polyps   . Hematuria, microscopic   . 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 Hypertension     Review of Systems  Constitutional: Negative for fever and fatigue.  Respiratory: Positive for shortness of breath. Negative for cough.   Cardiovascular: Positive for palpitations. Negative for chest pain and leg swelling.       Objective:   Physical Exam \\BP  112/70  Pulse 72  Temp(Src) 98.4 F (36.9 C) (Oral)  Resp 16  Wt 181 lb (82.101 kg)  SpO2 97% Wt Readings from Last 3 Encounters:  04/11/12 181 lb (82.101 kg)  03/07/12 191 lb (86.637 kg)  02/01/12 189 lb 12.8 oz (86.093 kg)   Constitutional: She appears well-developed and well-nourished. No distress.  Neck: Normal range of motion. Neck supple. No JVD present. No thyromegaly present.  Cardiovascular: Normal rate, regular rhythm and normal heart sounds.  No murmur heard. No BLE edema. Pulmonary/Chest: Effort normal and breath sounds normal. No respiratory distress.  She has no wheezes.   Psychiatric: She has a normal mood and affect. Her behavior is normal. Judgment and thought content normal.   Lab Results  Component Value Date   WBC 5.6 02/29/2012   HGB 11.8* 02/29/2012   HCT 36.8 02/29/2012   PLT 239.0 02/29/2012   GLUCOSE 84 02/29/2012   CHOL 254* 02/01/2012   TRIG 102.0 02/01/2012   HDL 64.20 02/01/2012   LDLDIRECT 156.9 02/01/2012   LDLCALC 104* 01/13/2010   ALT 16 02/01/2012   AST 16 02/01/2012   NA 140 02/29/2012   K 4.5 02/29/2012   CL 106 02/29/2012   CREATININE 0.9 02/29/2012   BUN 15 02/29/2012   CO2 28 02/29/2012   TSH 0.81 02/29/2012   HGBA1C 5.9 11/23/2009   Reviewed last 2 EKGs -     Assessment & Plan:  See problem list. Medications and labs reviewed today.  Also resting chest "fullness" -?atypical angina - CAD risk factors include HTN, chol; no exertional symptoms but check stress test

## 2012-04-11 NOTE — Patient Instructions (Signed)
It was good to see you today. We have reviewed your prior records Medications reviewed, no changes at this time. we'll make referral for heart stress test . Our office will contact you regarding appointment(s) once made. Please schedule followup in 4-6 months to recheck CXR, call sooner if problems.

## 2012-04-13 ENCOUNTER — Encounter: Payer: Self-pay | Admitting: Endocrinology

## 2012-04-13 LAB — CATECHOLAMINES, FRACTIONATED, URINE, 24 HOUR
Calculated Total (E+NE): 65 mcg/24 h (ref 26–121)
Creatinine, Urine mg/day-CATEUR: 1.29 g/(24.h) (ref 0.63–2.50)
Dopamine, 24 hr Urine: 218 mcg/24 h (ref 52–480)
Total Volume - CF 24Hr U: 700 mL

## 2012-04-14 LAB — METANEPHRINES, URINE, 24 HOUR: Metanephrines, Ur: 145 mcg/24 h (ref 90–315)

## 2012-04-15 ENCOUNTER — Encounter: Payer: Self-pay | Admitting: Endocrinology

## 2012-04-16 ENCOUNTER — Telehealth: Payer: Self-pay | Admitting: *Deleted

## 2012-04-16 NOTE — Telephone Encounter (Signed)
Called pt to inform of 24-hr urine test results, left message for pt to callback office (letter also mailed to pt).

## 2012-04-16 NOTE — Telephone Encounter (Signed)
Pt informed of results.

## 2012-04-23 ENCOUNTER — Encounter (HOSPITAL_COMMUNITY): Payer: BC Managed Care – PPO

## 2012-05-02 ENCOUNTER — Ambulatory Visit (HOSPITAL_COMMUNITY): Payer: 59 | Attending: Cardiovascular Disease | Admitting: Radiology

## 2012-05-02 VITALS — BP 112/71 | Ht 62.0 in | Wt 179.0 lb

## 2012-05-02 DIAGNOSIS — R0602 Shortness of breath: Secondary | ICD-10-CM

## 2012-05-02 DIAGNOSIS — E785 Hyperlipidemia, unspecified: Secondary | ICD-10-CM | POA: Insufficient documentation

## 2012-05-02 DIAGNOSIS — R079 Chest pain, unspecified: Secondary | ICD-10-CM

## 2012-05-02 DIAGNOSIS — I10 Essential (primary) hypertension: Secondary | ICD-10-CM | POA: Insufficient documentation

## 2012-05-02 DIAGNOSIS — R0789 Other chest pain: Secondary | ICD-10-CM | POA: Insufficient documentation

## 2012-05-02 MED ORDER — TECHNETIUM TC 99M TETROFOSMIN IV KIT
10.0000 | PACK | Freq: Once | INTRAVENOUS | Status: AC | PRN
  Administered 2012-05-02: 10 via INTRAVENOUS

## 2012-05-02 MED ORDER — TECHNETIUM TC 99M TETROFOSMIN IV KIT
30.0000 | PACK | Freq: Once | INTRAVENOUS | Status: AC | PRN
  Administered 2012-05-02: 30 via INTRAVENOUS

## 2012-05-02 NOTE — Progress Notes (Signed)
[facility] SITE 3 NUCLEAR MED [address] [place] [address] [phone]  Cardiology Nuclear Med Study  Sarah Santos is a 61 y.o. female     MRN : [id number]     DOB: 1951/04/14  Procedure Date: 05/02/2012  Nuclear Med Background Indication for Stress Test:  Evaluation for Ischemia History:  n/a Cardiac Risk Factors: Family History - CAD, History of Smoking, Hypertension and Lipids  Symptoms:  Chest Pain, DOE, Palpitations and SOB   Nuclear Pre-Procedure Caffeine/Decaff Intake:  None NPO After: 8:00am   Lungs:  clear O2 Sat: 98% on room air. IV 0.9% NS with Angio Cath:  22g  IV Site: R Antecubital  IV Started by:  Bonnita Levan, RN  Chest Size (in):  38 Cup Size: D  Height: 5\' 2"  (1.575 m)  Weight:  179 lb (81.194 kg)  BMI:  Body mass index is 32.74 kg/(m^2). Tech Comments:  N/A    Nuclear Med Study 1 or 2 day study: 1 day  Stress Test Type:  Stress  Reading MD: Kristeen Miss, MD  Order Authorizing Provider:  V.Leschber  Resting Radionuclide: Technetium 50m Tetrofosmin  Resting Radionuclide Dose: 11.0 mCi   Stress Radionuclide:  Technetium 75m Tetrofosmin  Stress Radionuclide Dose: 32.8 mCi           Stress Protocol Rest HR: 65 Stress HR: 146  Rest BP: 112/71 Stress BP: 167/64  Exercise Time (min): 6:00 METS: 7.0   Predicted Max HR: 160 bpm % Max HR: 91.25 bpm Rate Pressure Product: 72536   Dose of Adenosine (mg):  n/a Dose of Lexiscan: n/a mg  Dose of Atropine (mg): n/a Dose of Dobutamine: n/a mcg/kg/min (at max HR)  Stress Test Technologist: Milana Na, EMT-P  Nuclear Technologist:  Domenic Polite, CNMT     Rest Procedure:  Myocardial perfusion imaging was performed at rest 45 minutes following the intravenous administration of Technetium 15m Tetrofosmin. Rest ECG: NSR - Normal EKG  Stress Procedure:  The patient performed treadmill exercise using a Bruce  Protocol for 6:00 minutes. The patient stopped due to fatigue  and denied any chest pain.  There were no significant ST-T wave changes.  Technetium 92m Tetrofosmin was injected at peak exercise and myocardial perfusion imaging was performed after a brief delay. Stress ECG: No significant change from baseline ECG  QPS Raw Data Images:  Normal; no motion artifact; normal heart/lung ratio. Stress Images:  Normal homogeneous uptake in all areas of the myocardium. Rest Images:  Normal homogeneous uptake in all areas of the myocardium. Subtraction (SDS):  Normal Transient Ischemic Dilatation (Normal <1.22):  0.95 Lung/Heart Ratio (Normal <0.45):  0.24  Quantitative Gated Spect Images QGS EDV:  65 ml QGS ESV:  18 ml  Impression Exercise Capacity:  Fair exercise capacity. BP Response:  Normal blood pressure response. Clinical Symptoms:  No significant symptoms noted. ECG Impression:  No significant ST segment change suggestive of ischemia. Comparison with Prior Nuclear Study: No images to compare  Overall Impression:  Normal stress nuclear study.  LV Ejection Fraction: 72%.  LV Wall Motion:  NL LV Function; NL Wall Motion   Charlton Haws

## 2012-05-03 NOTE — Progress Notes (Signed)
Pt advised of result

## 2012-07-12 ENCOUNTER — Ambulatory Visit (INDEPENDENT_AMBULATORY_CARE_PROVIDER_SITE_OTHER): Payer: 59 | Admitting: Internal Medicine

## 2012-07-12 ENCOUNTER — Encounter: Payer: Self-pay | Admitting: Internal Medicine

## 2012-07-12 VITALS — BP 112/70 | HR 79 | Temp 98.3°F | Ht 62.0 in | Wt 174.4 lb

## 2012-07-12 DIAGNOSIS — I10 Essential (primary) hypertension: Secondary | ICD-10-CM

## 2012-07-12 DIAGNOSIS — E785 Hyperlipidemia, unspecified: Secondary | ICD-10-CM

## 2012-07-12 DIAGNOSIS — Z79899 Other long term (current) drug therapy: Secondary | ICD-10-CM

## 2012-07-12 DIAGNOSIS — M76899 Other specified enthesopathies of unspecified lower limb, excluding foot: Secondary | ICD-10-CM

## 2012-07-12 NOTE — Progress Notes (Signed)
  Subjective:    Patient ID: Sarah Santos, female    DOB: 07/22/1951, 61 y.o.   MRN: [id number]  HPI   here for follow up - reviewed chronic medical issues:  hypertension - started ARB-hctz 02/2012, the patient reports compliance with medication(s) as prescribed. Denies adverse side effects.  dyslipidemia - resumed statin qo 02/2012 - also on low fat diet (Daniel fast) since 12/2011 - denies adverse side effects related to therapy.   OA/bursitis r hip - occasional numbness while reports compliance with ongoing medical treatment (otc tylenol) and no changes in medication dose or frequency. denies adverse side effects related to current therapy.  no swelling in joints  Mild hyperparathyroid with hypercalcemia - dx 01/2011 on routine labs - has seen endo for same  Past Medical History  Diagnosis Date  . HYPERLIPIDEMIA   . ALLERGIC RHINITIS   . HEADACHE, CHRONIC   . Primary hyperparathyroidism   . Hypercalcemia   . GERD   . Hx of adenomatous colonic polyps   . Hematuria, microscopic   . 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 Hypertension     Review of Systems  Constitutional: Negative for fever and fatigue.  Respiratory: Negative for cough and shortness of breath.   Cardiovascular: Negative for chest pain, palpitations and leg swelling.      Objective:   Physical Exam  \BP 112/70  Pulse 79  Temp 98.3 F (36.8 C) (Oral)  Ht 5\' 2"  (1.575 m)  Wt 174 lb 6.4 oz (79.107 kg)  BMI 31.90 kg/m2  SpO2 98% Wt Readings from Last 3 Encounters:  07/12/12 174 lb 6.4 oz (79.107 kg)  05/02/12 179 lb (81.194 kg)  04/11/12 181 lb (82.101 kg)   Constitutional: She appears well-developed and well-nourished. No distress.  Neck: Normal range of motion. Neck supple. No JVD present. No thyromegaly present.  Cardiovascular: Normal rate, regular rhythm and normal heart sounds.  No murmur heard. No BLE edema. Pulmonary/Chest: Effort normal and breath  sounds normal. No respiratory distress. She has no wheezes.   MSkel: tender over R greater troch bursa - FROM, no joint effusions, gait normal Psychiatric: She has a normal mood and affect. Her behavior is normal. Judgment and thought content normal.   Lab Results  Component Value Date   WBC 5.6 02/29/2012   HGB 11.8* 02/29/2012   HCT 36.8 02/29/2012   PLT 239.0 02/29/2012   GLUCOSE 84 02/29/2012   CHOL 254* 02/01/2012   TRIG 102.0 02/01/2012   HDL 64.20 02/01/2012   LDLDIRECT 156.9 02/01/2012   LDLCALC 104* 01/13/2010   ALT 16 02/01/2012   AST 16 02/01/2012   NA 140 02/29/2012   K 4.5 02/29/2012   CL 106 02/29/2012   CREATININE 0.9 02/29/2012   BUN 15 02/29/2012   CO2 28 02/29/2012   TSH 0.81 02/29/2012   HGBA1C 5.9 11/23/2009       Assessment & Plan:  See problem list. Medications and labs reviewed today.

## 2012-07-12 NOTE — Assessment & Plan Note (Signed)
Reviewed symptoms - encouraged OTC NSAIDs stretch and continued exercise - consider injection of worsening pain

## 2012-07-12 NOTE — Assessment & Plan Note (Signed)
Changed to generic ARB-HCTZ 1 week ago - improved  BP Readings from Last 3 Encounters:  07/12/12 112/70  05/02/12 112/71  04/11/12 112/70   The current medical regimen is effective;  continue present plan and medications.

## 2012-07-12 NOTE — Assessment & Plan Note (Signed)
Self stopped statin 12/2011 due to weight loss with diet changes (daniel fast) Increase in total and LDL  01/2012 reviewed -  Resume simva 20mg  qod 02/2012 Check lipids now  -continue present plan and medications.

## 2012-07-12 NOTE — Patient Instructions (Signed)
It was good to see you today. We have reviewed your prior records including labs and tests today Test(s) ordered today. Return next week when you are fasting. Your results will be called to you after review (48-72hours after test completion). If any changes need to be made, you will be notified at that time. Medications reviewed, no changes at this time. Use Aleve 2x/day for your right leg symptoms as discussed and see below for exercises to help your hip Please schedule followup in 6 months for cholesterol and blood pressure check, call sooner if problems. Trochanteric Bursitis You have hip pain due to trochanteric bursitis. Bursitis means that the sack near the outside of the hip is filled with fluid and inflamed. This sack is made up of protective soft tissue. The pain from trochanteric bursitis can be severe and keep you from sleep. It can radiate to the buttocks or down the outside of the thigh to the knee. The pain is almost always worse when rising from the seated or lying position and with walking. Pain can improve after you take a few steps. It happens more often in people with hip joint and lumbar spine problems, such as arthritis or previous surgery. Very rarely the trochanteric bursa can become infected, and antibiotics and/or surgery may be needed. Treatment often includes an injection of local anesthetic mixed with cortisone medicine. This medicine is injected into the area where it is most tender over the hip. Repeat injections may be necessary if the response to treatment is slow. You can apply ice packs over the tender area for 30 minutes every 2 hours for the next few days. Anti-inflammatory and/or narcotic pain medicine may also be helpful. Limit your activity for the next few days if the pain continues. See your caregiver in 5-10 days if you are not greatly improved.   SEEK IMMEDIATE MEDICAL CARE IF:  You develop severe pain, fever, or increased redness.   You have pain that radiates  below the knee.  EXERCISES STRETCHING EXERCISES - Trochantic Bursitis  These exercises may help you when beginning to rehabilitate your injury. Your symptoms may resolve with or without further involvement from your physician, physical therapist or athletic trainer. While completing these exercises, remember:    Restoring tissue flexibility helps normal motion to return to the joints. This allows healthier, less painful movement and activity.   An effective stretch should be held for at least 30 seconds.   A stretch should never be painful. You should only feel a gentle lengthening or release in the stretched tissue.  STRETCH - Iliotibial Band  On the floor or bed, lie on your side so your injured leg is on top. Bend your knee and grab your ankle.   Slowly bring your knee back so that your thigh is in line with your trunk. Keep your heel at your buttocks and gently arch your back so your head, shoulders and hips line up.   Slowly lower your leg so that your knee approaches the floor/bed until you feel a gentle stretch on the outside of your thigh. If you do not feel a stretch and your knee will not fall farther, place the heel of your opposite foot on top of your knee and pull your thigh down farther.   Hold this stretch for __________ seconds.   Repeat __________ times. Complete this exercise __________ times per day.  STRETCH - Hamstrings, Supine   Lie on your back. Loop a belt or towel over the ball of  your foot as shown.   Straighten your knee and slowly pull on the belt to raise your injured leg. Do not allow the knee to bend. Keep your opposite leg flat on the floor.   Raise the leg until you feel a gentle stretch behind your knee or thigh. Hold this position for __________ seconds.   Repeat __________ times. Complete this stretch __________ times per day.  STRETCH - Quadriceps, Prone   Lie on your stomach on a firm surface, such as a bed or padded floor.   Bend your knee and  grasp your ankle. If you are unable to reach, your ankle or pant leg, use a belt around your foot to lengthen your reach.   Gently pull your heel toward your buttocks. Your knee should not slide out to the side. You should feel a stretch in the front of your thigh and/or knee.   Hold this position for __________ seconds.   Repeat __________ times. Complete this stretch __________ times per day.  STRETCHING - Hip Flexors, Lunge Half kneel with your knee on the floor and your opposite knee bent and directly over your ankle.  Keep good posture with your head over your shoulders. Tighten your buttocks to point your tailbone downward; this will prevent your back from arching too much.   You should feel a gentle stretch in the front of your thigh and/or hip. If you do not feel any resistance, slightly slide your opposite foot forward and then slowly lunge forward so your knee once again lines up over your ankle. Be sure your tailbone remains pointed downward.   Hold this stretch for __________ seconds.   Repeat __________ times. Complete this stretch __________ times per day.  STRETCH - Adductors, Lunge  While standing, spread your legs   Lean away from your injured leg by bending your opposite knee. You may rest your hands on your thigh for balance.   You should feel a stretch in your inner thigh. Hold for __________ seconds.   Repeat __________ times. Complete this exercise __________ times per day.  Document Released: 01/19/2005 Document Revised: 12/01/2011 Document Reviewed: 03/26/2009 [facility] Patient Information 2012 [place], [place].

## 2012-07-18 ENCOUNTER — Other Ambulatory Visit (INDEPENDENT_AMBULATORY_CARE_PROVIDER_SITE_OTHER): Payer: 59

## 2012-07-18 DIAGNOSIS — I10 Essential (primary) hypertension: Secondary | ICD-10-CM

## 2012-07-18 DIAGNOSIS — E785 Hyperlipidemia, unspecified: Secondary | ICD-10-CM

## 2012-07-18 DIAGNOSIS — Z79899 Other long term (current) drug therapy: Secondary | ICD-10-CM

## 2012-07-18 LAB — LIPID PANEL
Cholesterol: 164 mg/dL (ref 0–200)
HDL: 67.7 mg/dL (ref >39.00)
LDL Cholesterol: 88 mg/dL (ref 0–99)
Total CHOL/HDL Ratio: 2
Triglycerides: 40 mg/dL (ref 0.0–149.0)
VLDL: 8 mg/dL (ref 0.0–40.0)

## 2012-07-18 LAB — HEPATIC FUNCTION PANEL
ALT: 13 U/L (ref 0–35)
AST: 17 U/L (ref 0–37)
Albumin: 4 g/dL (ref 3.5–5.2)
Alkaline Phosphatase: 74 U/L (ref 39–117)
Total Protein: 7.2 g/dL (ref 6.0–8.3)

## 2012-07-18 LAB — BASIC METABOLIC PANEL
CO2: 30 mEq/L (ref 19–32)
Calcium: 10.5 mg/dL (ref 8.4–10.5)
Creatinine, Ser: 0.8 mg/dL (ref 0.4–1.2)
GFR: 91.31 mL/min (ref >60.00)
Sodium: 139 mEq/L (ref 135–145)

## 2012-07-25 ENCOUNTER — Other Ambulatory Visit: Payer: Self-pay | Admitting: Obstetrics and Gynecology

## 2012-07-25 DIAGNOSIS — R928 Other abnormal and inconclusive findings on diagnostic imaging of breast: Secondary | ICD-10-CM

## 2012-08-03 ENCOUNTER — Ambulatory Visit
Admission: RE | Admit: 2012-08-03 | Discharge: 2012-08-03 | Disposition: A | Discharge status: Home / Self Care | Payer: 59 | Source: Ambulatory Visit | Attending: Obstetrics and Gynecology | Admitting: Obstetrics and Gynecology

## 2012-08-03 DIAGNOSIS — R928 Other abnormal and inconclusive findings on diagnostic imaging of breast: Secondary | ICD-10-CM

## 2012-11-01 ENCOUNTER — Ambulatory Visit (INDEPENDENT_AMBULATORY_CARE_PROVIDER_SITE_OTHER): Payer: 59 | Admitting: Internal Medicine

## 2012-11-01 ENCOUNTER — Encounter: Payer: Self-pay | Admitting: Internal Medicine

## 2012-11-01 VITALS — BP 130/70 | HR 56 | Temp 97.5°F | Ht 62.0 in | Wt 169.4 lb

## 2012-11-01 DIAGNOSIS — I10 Essential (primary) hypertension: Secondary | ICD-10-CM

## 2012-11-01 DIAGNOSIS — R3129 Other microscopic hematuria: Secondary | ICD-10-CM

## 2012-11-01 DIAGNOSIS — E785 Hyperlipidemia, unspecified: Secondary | ICD-10-CM

## 2012-11-01 MED ORDER — LOSARTAN POTASSIUM-HCTZ 50-12.5 MG PO TABS: 1.0000 | ORAL_TABLET | Freq: Two times a day (BID) | ORAL | Status: DC

## 2012-11-01 NOTE — Assessment & Plan Note (Signed)
Chronic - hx same with prior uro eval summer 2010: unremarkable per pt after uro testing - Will send for copy of these records

## 2012-11-01 NOTE — Assessment & Plan Note (Signed)
Changed to generic ARB-HCTZ 06/2012 - improved but not at goal Increase to BID at pt pref (to 100/25 qd) - monitor  BP Readings from Last 3 Encounters:  11/01/12 130/70  07/12/12 112/70  05/02/12 112/71

## 2012-11-01 NOTE — Assessment & Plan Note (Signed)
Self stopped statin 12/2011 due to weight loss with diet changes (daniel fast) Increase in total and LDL  01/2012 reviewed -  Resumed simva 20mg qod 02/2012 Check lipids annually and titrate as needed 

## 2012-11-01 NOTE — Patient Instructions (Signed)
It was good to see you today. Increase blood pressure medication to twice daily as discussed - Your prescription(s) have been submitted to your pharmacy. Please take as directed and contact our office if you believe you are having problem(s) with the medication(s). Send to urology for copy of 2010 evaluation -release of information signed today Please schedule followup in 6 months for physical and labs, call sooner if problems.

## 2012-11-01 NOTE — Progress Notes (Signed)
  Subjective:    Patient ID: Sarah Santos, female    DOB: 1951/08/29, 61 y.o.   MRN: [id number]  HPI   here for follow up - reviewed chronic medical issues:  hypertension - started ARB-hctz 02/2012, the patient reports compliance with medication(s) as prescribed. Denies adverse side effects. Still reports elevated blood pressure, esp evenings (170/90s).  dyslipidemia - resumed statin qod 02/2012 - also on low fat diet (Daniel fast) since 12/2011 - denies adverse side effects related to therapy.   OA/bursitis r hip - occasional numbness while reports compliance with ongoing medical treatment (otc tylenol) and no changes in medication dose or frequency. denies adverse side effects related to current therapy.  no swelling in joints  Mild hyperparathyroid with hypercalcemia - dx 01/2011 on routine labs - has seen endo for same  Chronic microhematuria - prior uro eval for same 2010 - unremarkable - recently noted by gyn as well - no gross hematuria or symptoms UTI  Past Medical History  Diagnosis Date  . HYPERLIPIDEMIA   . ALLERGIC RHINITIS   . HEADACHE, CHRONIC   . Primary hyperparathyroidism   . Hypercalcemia   . GERD   . Hx of adenomatous colonic polyps   . Hematuria, microscopic   . 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 Hypertension     Review of Systems  Constitutional: Negative for fever and fatigue.  Respiratory: Negative for cough and shortness of breath.   Cardiovascular: Negative for chest pain, palpitations and leg swelling.      Objective:   Physical Exam  \BP 130/70  Pulse 56  Temp 97.5 F (36.4 C) (Oral)  Ht 5\' 2"  (1.575 m)  Wt 169 lb 6.4 oz (76.839 kg)  BMI 30.98 kg/m2  SpO2 92% Wt Readings from Last 3 Encounters:  11/01/12 169 lb 6.4 oz (76.839 kg)  07/12/12 174 lb 6.4 oz (79.107 kg)  05/02/12 179 lb (81.194 kg)   Constitutional: She appears well-developed and well-nourished. No distress.  Neck: Normal range of  motion. Neck supple. No JVD present. No thyromegaly present.  Cardiovascular: Normal rate, regular rhythm and normal heart sounds.  No murmur heard. No BLE edema. Pulmonary/Chest: Effort normal and breath sounds normal. No respiratory distress. She has no wheezes.   Psychiatric: She has a normal mood and affect. Her behavior is normal. Judgment and thought content normal.   Lab Results  Component Value Date   WBC 5.6 02/29/2012   HGB 11.8* 02/29/2012   HCT 36.8 02/29/2012   PLT 239.0 02/29/2012   GLUCOSE 86 07/18/2012   CHOL 164 07/18/2012   TRIG 40.0 07/18/2012   HDL 67.70 07/18/2012   LDLDIRECT 156.9 02/01/2012   LDLCALC 88 07/18/2012   ALT 13 07/18/2012   AST 17 07/18/2012   NA 139 07/18/2012   K 3.9 07/18/2012   CL 102 07/18/2012   CREATININE 0.8 07/18/2012   BUN 19 07/18/2012   CO2 30 07/18/2012   TSH 0.81 02/29/2012   HGBA1C 5.9 11/23/2009       Assessment & Plan:  See problem list. Medications and labs reviewed today.

## 2012-11-03 ENCOUNTER — Encounter (HOSPITAL_COMMUNITY): Payer: Self-pay | Admitting: Emergency Medicine

## 2012-11-03 ENCOUNTER — Emergency Department (HOSPITAL_COMMUNITY)
Admission: EM | Admit: 2012-11-03 | Discharge: 2012-11-03 | Disposition: A | Discharge status: Home / Self Care | Payer: 59 | Attending: Emergency Medicine | Admitting: Emergency Medicine

## 2012-11-03 ENCOUNTER — Emergency Department (HOSPITAL_COMMUNITY): Payer: 59

## 2012-11-03 DIAGNOSIS — Z862 Personal history of diseases of the blood and blood-forming organs and certain disorders involving the immune mechanism: Secondary | ICD-10-CM | POA: Insufficient documentation

## 2012-11-03 DIAGNOSIS — Z87448 Personal history of other diseases of urinary system: Secondary | ICD-10-CM | POA: Insufficient documentation

## 2012-11-03 DIAGNOSIS — J309 Allergic rhinitis, unspecified: Secondary | ICD-10-CM | POA: Insufficient documentation

## 2012-11-03 DIAGNOSIS — R0789 Other chest pain: Secondary | ICD-10-CM | POA: Insufficient documentation

## 2012-11-03 DIAGNOSIS — Z79899 Other long term (current) drug therapy: Secondary | ICD-10-CM | POA: Insufficient documentation

## 2012-11-03 DIAGNOSIS — Z860100 Personal history of colon polyps, unspecified: Secondary | ICD-10-CM | POA: Insufficient documentation

## 2012-11-03 DIAGNOSIS — Z8639 Personal history of other endocrine, nutritional and metabolic disease: Secondary | ICD-10-CM | POA: Insufficient documentation

## 2012-11-03 DIAGNOSIS — I10 Essential (primary) hypertension: Secondary | ICD-10-CM | POA: Insufficient documentation

## 2012-11-03 DIAGNOSIS — Z8719 Personal history of other diseases of the digestive system: Secondary | ICD-10-CM | POA: Insufficient documentation

## 2012-11-03 DIAGNOSIS — E785 Hyperlipidemia, unspecified: Secondary | ICD-10-CM | POA: Insufficient documentation

## 2012-11-03 DIAGNOSIS — Z87891 Personal history of nicotine dependence: Secondary | ICD-10-CM | POA: Insufficient documentation

## 2012-11-03 LAB — BASIC METABOLIC PANEL
Calcium: 11.5 mg/dL — ABNORMAL HIGH (ref 8.4–10.5)
GFR calc Af Amer: 80 mL/min — ABNORMAL LOW (ref >90)
GFR calc non Af Amer: 69 mL/min — ABNORMAL LOW (ref >90)
Glucose, Bld: 119 mg/dL — ABNORMAL HIGH (ref 70–99)
Potassium: 3 mEq/L — ABNORMAL LOW (ref 3.5–5.1)
Sodium: 134 mEq/L — ABNORMAL LOW (ref 135–145)

## 2012-11-03 LAB — CBC WITH DIFFERENTIAL/PLATELET
Basophils Relative: 0 % (ref 0–1)
Eosinophils Absolute: 0 10*3/uL (ref 0.0–0.7)
Eosinophils Relative: 0 % (ref 0–5)
Lymphs Abs: 2.2 10*3/uL (ref 0.7–4.0)
MCH: 26.7 pg (ref 26.0–34.0)
MCHC: 33.7 g/dL (ref 30.0–36.0)
MCV: 79.2 fL (ref 78.0–100.0)
Neutrophils Relative %: 64 % (ref 43–77)
Platelets: 287 10*3/uL (ref 150–400)
RBC: 4.42 MIL/uL (ref 3.87–5.11)
RDW: 13.1 % (ref 11.5–15.5)

## 2012-11-03 LAB — POCT I-STAT TROPONIN I: Troponin i, poc: 0.01 ng/mL (ref 0.00–0.08)

## 2012-11-03 MED ORDER — POTASSIUM CHLORIDE CRYS ER 20 MEQ PO TBCR: 20.0000 meq | EXTENDED_RELEASE_TABLET | Freq: Two times a day (BID) | ORAL | Status: DC

## 2012-11-03 MED ORDER — POTASSIUM CHLORIDE CRYS ER 20 MEQ PO TBCR
40.0000 meq | EXTENDED_RELEASE_TABLET | Freq: Once | ORAL | Status: AC
  Administered 2012-11-03: 40 meq via ORAL
  Filled 2012-11-03: qty 2

## 2012-11-03 NOTE — ED Notes (Signed)
MD at bedside. 

## 2012-11-03 NOTE — ED Provider Notes (Addendum)
History     CSN: [id number]  Arrival date & time 11/03/12  1639   First MD Initiated Contact with Patient 11/03/12 1756      Chief Complaint  Patient presents with  . Chest Pain    (Consider location/radiation/quality/duration/timing/severity/associated sxs/prior treatment) HPI Patient reports that approximately 11 AM today she felt "my blood pressure was up" she felt a rush to her head. She she treated herself with losartan-hctz with relief. Patient then developed chest tightness and a choking sensation when she drank water this afternoon. Symptoms feel like a spasm which lasted 30 minutes resolved spontaneously she had 2 episodes of "chest spasm" each time when she is drinking. She denies shortness of breath denies nausea denies sweatiness she is presently asymptomatic no other associated symptoms Past Medical History  Diagnosis Date  . HYPERLIPIDEMIA   . ALLERGIC RHINITIS   . HEADACHE, CHRONIC   . Primary hyperparathyroidism   . Hypercalcemia   . GERD   . Hx of adenomatous colonic polyps   . Hematuria, microscopic   . 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 Hypertension     Past Surgical History  Procedure Date  . Tubal ligation   . Colonoscopy w/ polypectomy 2009, 08/18/11    2009:adenomas, largest 4 cm, rectal carcinoid, hemorrhoids    Family History  Problem Relation Age of Onset  . Heart disease Mother 48    PCI  . Cancer Other     Lung  . Heart disease Other     Coronary Atery Disease  . Colon cancer Neg Hx   . Colon polyps Neg Hx     History  Substance Use Topics  . Smoking status: Former Smoker    Quit date: 03/03/1991  . Smokeless tobacco: Not on file  . Alcohol Use: No    OB History    Grav Para Term Preterm Abortions TAB SAB Ect Mult Living                  Review of Systems  Constitutional: Negative.   HENT: Negative.   Respiratory: Negative.   Cardiovascular: Positive for chest pain.  Gastrointestinal:  Negative.   Musculoskeletal: Negative.   Skin: Negative.   Neurological: Negative.   Hematological: Negative.   Psychiatric/Behavioral: Negative.     Allergies  Review of patient's allergies indicates no known allergies.  Home Medications   Current Outpatient Rx  Name  Route  Sig  Dispense  Refill  . CLOTRIMAZOLE-BETAMETHASONE 1-0.05 % EX CREA   Topical   Apply 1 application topically as needed. Tinea pedis         . IBUPROFEN 200 MG PO TABS   Oral   Take 200 mg by mouth every 6 (six) hours as needed. Headache         . LOSARTAN POTASSIUM-HCTZ 50-12.5 MG PO TABS   Oral   Take 1 tablet by mouth daily.         Marland Kitchen SIMVASTATIN 20 MG PO TABS   Oral   Take 20 mg by mouth every evening.           BP 124/58  Pulse 98  Temp 98.1 F (36.7 C) (Oral)  Resp 26  SpO2 100%  Physical Exam  Nursing note and vitals reviewed. Constitutional: She appears well-developed and well-nourished.  HENT:  Head: Normocephalic and atraumatic.  Eyes: Conjunctivae normal are normal. Pupils are equal, round, and reactive to light.  Neck: Neck supple.  No tracheal deviation present. No thyromegaly present.  Cardiovascular: Regular rhythm.   No murmur heard.      Mildly tachycardic 105 minute  Pulmonary/Chest: Effort normal and breath sounds normal.  Abdominal: Soft. Bowel sounds are normal. She exhibits no distension. There is no tenderness.  Musculoskeletal: Normal range of motion. She exhibits no edema and no tenderness.  Neurological: She is alert. Coordination normal.  Skin: Skin is warm and dry. No rash noted.  Psychiatric: She has a normal mood and affect.    ED Course  Procedures (including critical care time)   Labs Reviewed  POCT I-STAT TROPONIN I  CBC WITH DIFFERENTIAL  BASIC METABOLIC PANEL   No results found.   Date: 11/03/2012  Rate: 135  Rhythm: sinus tachycardia  QRS Axis: left  Intervals: normal  ST/T Wave abnormalities: nonspecific T wave changes   Conduction Disutrbances:none  Narrative Interpretation:   Old EKG Reviewed: unchanged Unchanged from 05/02/2012 interpreted by me  No diagnosis found.  10:10 PM patient states "I feel well"  MDM  Assessment strongly doubt acute coronary syndrome highly atypical symptoms. Nonacute EKG, and to negative cardiac markers..Pulmonary embolism no shortness of breath. Sinus tachycardia resolved spontaneously without treatment,, sinus tachycardia likely secondary to anxiety. Plan prescription K-dur, followup Dr. Felicity Coyer next week Diagnoses #1 atypical chest pain #2 hypokalemia #87mild hypercalcemia      Doug Sou, MD 11/03/12 2221  Doug Sou, MD 11/03/12 2223

## 2012-11-03 NOTE — ED Notes (Addendum)
Reports around 1230hrsg took 2nd bp pill swolled felt lump in chest rated 2/10, denies radiation, denies any other symptoms. Lump in chest reoccured around 1500hrs while making up bed & felt like heart was racing at this time felt light headed checked bp (due to going up to 200 did not finish checking) decided drive self to go to the ED. Patient declined IV

## 2012-11-16 ENCOUNTER — Ambulatory Visit (INDEPENDENT_AMBULATORY_CARE_PROVIDER_SITE_OTHER): Payer: 59 | Admitting: Internal Medicine

## 2012-11-16 ENCOUNTER — Encounter: Payer: Self-pay | Admitting: Internal Medicine

## 2012-11-16 VITALS — BP 120/68 | HR 67 | Temp 98.1°F | Ht 62.0 in | Wt 167.0 lb

## 2012-11-16 DIAGNOSIS — R0789 Other chest pain: Secondary | ICD-10-CM

## 2012-11-16 DIAGNOSIS — I10 Essential (primary) hypertension: Secondary | ICD-10-CM

## 2012-11-16 DIAGNOSIS — F410 Panic disorder [episodic paroxysmal anxiety] without agoraphobia: Secondary | ICD-10-CM

## 2012-11-16 DIAGNOSIS — E876 Hypokalemia: Secondary | ICD-10-CM

## 2012-11-16 MED ORDER — POTASSIUM CHLORIDE CRYS ER 20 MEQ PO TBCR: 20.0000 meq | EXTENDED_RELEASE_TABLET | Freq: Every day | ORAL | Status: DC

## 2012-11-16 MED ORDER — ALPRAZOLAM 0.5 MG PO TABS: 0.5000 mg | ORAL_TABLET | Freq: Every evening | ORAL | Status: DC | PRN

## 2012-11-16 NOTE — Assessment & Plan Note (Signed)
Low grade anxiety with panic attacks -  Reviewed symptoms and tx of same - eduction provided Pt will work on biofeedback self control -  Also encouraged to consider prn BZ for symptoms and BP spikes for mgmt of same

## 2012-11-16 NOTE — Progress Notes (Signed)
Subjective:    Patient ID: Sarah Santos, female    DOB: 1951/10/12, 61 y.o.   MRN: [id number]  HPI  here for ER follow up -   Also reviewed chronic medical issues:  hypertension - started ARB-hctz 02/2012, the patient reports compliance with medication(s) as prescribed. Denies adverse side effects.  dyslipidemia - resumed statin qo 02/2012 - also on low fat diet (Daniel fast) since 12/2011 - denies adverse side effects related to therapy.   Mild hyperparathyroid with hypercalcemia - dx 01/2011 on routine labs - has seen endo for same  Past Medical History  Diagnosis Date  . HYPERLIPIDEMIA   . ALLERGIC RHINITIS   . HEADACHE, CHRONIC   . Primary hyperparathyroidism   . Hypercalcemia   . GERD   . Hx of adenomatous colonic polyps   . Hematuria, microscopic   . 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 Hypertension     Review of Systems  Constitutional: Negative for fever and fatigue.  Respiratory: Negative for cough and shortness of breath.   Cardiovascular: Negative for chest pain, palpitations and leg swelling.      Objective:   Physical Exam  \BP 120/68  Pulse 67  Temp 98.1 F (36.7 C) (Oral)  Ht 5\' 2"  (1.575 m)  Wt 167 lb (75.751 kg)  BMI 30.54 kg/m2  SpO2 99% Wt Readings from Last 3 Encounters:  11/16/12 167 lb (75.751 kg)  11/01/12 169 lb 6.4 oz (76.839 kg)  07/12/12 174 lb 6.4 oz (79.107 kg)   Constitutional: She appears well-developed and well-nourished. No distress.  Neck: Normal range of motion. Neck supple. No JVD present. No thyromegaly present.  Cardiovascular: Normal rate, regular rhythm and normal heart sounds.  No murmur heard. No BLE edema. Pulmonary/Chest: Effort normal and breath sounds normal. No respiratory distress. She has no wheezes.   Psychiatric: She has a mildly anxious mood and affect. Her behavior is normal. Judgment and thought content normal.   Lab Results  Component Value Date   WBC 7.2  11/03/2012   HGB 11.8* 11/03/2012   HCT 35.0* 11/03/2012   PLT 287 11/03/2012   GLUCOSE 119* 11/03/2012   CHOL 164 07/18/2012   TRIG 40.0 07/18/2012   HDL 67.70 07/18/2012   LDLDIRECT 156.9 02/01/2012   LDLCALC 88 07/18/2012   ALT 13 07/18/2012   AST 17 07/18/2012   NA 134* 11/03/2012   K 3.0* 11/03/2012   CL 95* 11/03/2012   CREATININE 0.89 11/03/2012   BUN 20 11/03/2012   CO2 27 11/03/2012   TSH 0.81 02/29/2012   HGBA1C 5.9 11/23/2009   Nuc stress test 05/03/12: Impression Exercise Capacity:  Fair exercise capacity. BP Response:  Normal blood pressure response. Clinical Symptoms:  No significant symptoms noted. ECG Impression:  No significant ST segment change suggestive of ischemia. Comparison with Prior Nuclear Study: No images to compare  Overall Impression:  Normal stress nuclear study. LV Ejection Fraction: 72%.  LV Wall Motion:  NL LV Function; NL Wall Motion      Assessment & Plan:  See problem list. Medications and labs reviewed today.  Atypical CP with ER eval for same 11/03/12 reviewed Also nuc med stress 04/2012 negative  Suspect element of anxiety with panic attacks - see below  Hypokalemia - due to HCTZ in BP med - add KCl 20 qd - erx done  Time spent with pt today 25 minutes, greater than 50% time spent counseling patient on chest pain, hypertension  and panic attacks with medication review. Also review of ER records

## 2012-11-16 NOTE — Patient Instructions (Signed)
It was good to see you today. We have reviewed your ER records including labs and tests today Medications reviewed and updated - start potassium daily and use xanax as needed for panic attack symptoms  Your prescription(s) have been submitted to your pharmacy. Please take as directed and contact our office if you believe you are having problem(s) with the medication(s).  Anxiety and Panic Attacks Your caregiver has informed you that you are having an anxiety or panic attack. There may be many forms of this. Most of the time these attacks come suddenly and without warning. They come at any time of day, including periods of sleep, and at any time of life. They may be strong and unexplained. Although panic attacks are very scary, they are physically harmless. Sometimes the cause of your anxiety is not known. Anxiety is a protective mechanism of the body in its fight or flight mechanism. Most of these perceived danger situations are actually nonphysical situations (such as anxiety over losing a job). CAUSES   The causes of an anxiety or panic attack are many. Panic attacks may occur in otherwise healthy people given a certain set of circumstances. There may be a genetic cause for panic attacks. Some medications may also have anxiety as a side effect. SYMPTOMS   Some of the most common feelings are:  Intense terror.   Dizziness, feeling faint.   Hot and cold flashes.   Fear of going crazy.   Feelings that nothing is real.   Sweating.   Shaking.   Chest pain or a fast heartbeat (palpitations).   Smothering, choking sensations.   Feelings of impending doom and that death is near.   Tingling of extremities, this may be from over-breathing.   Altered reality (derealization).   Being detached from yourself (depersonalization).  Several symptoms can be present to make up anxiety or panic attacks. DIAGNOSIS   The evaluation by your caregiver will depend on the type of symptoms you  are experiencing. The diagnosis of anxiety or panic attack is made when no physical illness can be determined to be a cause of the symptoms. TREATMENT   Treatment to prevent anxiety and panic attacks may include:  Avoidance of circumstances that cause anxiety.   Reassurance and relaxation.   Regular exercise.   Relaxation therapies, such as yoga.   Psychotherapy with a psychiatrist or therapist.   Avoidance of caffeine, alcohol and illegal drugs.   Prescribed medication.  SEEK IMMEDIATE MEDICAL CARE IF:    You experience panic attack symptoms that are different than your usual symptoms.   You have any worsening or concerning symptoms.  Document Released: 12/12/2005 Document Revised: 03/05/2012 Document Reviewed: 04/15/2010 [facility] Patient Information 2013 [place], [place].

## 2012-11-16 NOTE — Assessment & Plan Note (Signed)
Changed to generic ARB-HCTZ 06/2012 - improved but not at goal Increase to BID at pt pref (to 100/25 qd) - monitor  BP Readings from Last 3 Encounters:  11/16/12 120/68  11/03/12 114/55  11/01/12 130/70

## 2013-02-13 ENCOUNTER — Telehealth: Payer: Self-pay | Admitting: *Deleted

## 2013-02-13 DIAGNOSIS — Z0000 Encounter for general adult medical examination without abnormal findings: Secondary | ICD-10-CM

## 2013-02-13 NOTE — Telephone Encounter (Signed)
Message copied by Deatra James on Wed Feb 13, 2013  4:32 PM ------      Message from: Etheleen Sia      Created: Wed Feb 13, 2013  4:14 PM      Regarding: LAB       PHYSICAL LABS FOR [facility] APPT ------

## 2013-03-11 ENCOUNTER — Other Ambulatory Visit (INDEPENDENT_AMBULATORY_CARE_PROVIDER_SITE_OTHER): Payer: 59

## 2013-03-11 DIAGNOSIS — Z0000 Encounter for general adult medical examination without abnormal findings: Secondary | ICD-10-CM

## 2013-03-11 LAB — CBC WITH DIFFERENTIAL/PLATELET
Basophils Relative: 0.6 % (ref 0.0–3.0)
HCT: 33.1 % — ABNORMAL LOW (ref 36.0–46.0)
Hemoglobin: 10.9 g/dL — ABNORMAL LOW (ref 12.0–15.0)
Lymphocytes Relative: 48.6 % — ABNORMAL HIGH (ref 12.0–46.0)
Lymphs Abs: 2.6 10*3/uL (ref 0.7–4.0)
MCHC: 32.9 g/dL (ref 30.0–36.0)
Monocytes Relative: 9.6 % (ref 3.0–12.0)
Neutro Abs: 2 10*3/uL (ref 1.4–7.7)
RBC: 4.06 Mil/uL (ref 3.87–5.11)

## 2013-03-11 LAB — BASIC METABOLIC PANEL
BUN: 18 mg/dL (ref 6–23)
CO2: 27 mEq/L (ref 19–32)
Chloride: 102 mEq/L (ref 96–112)
Glucose, Bld: 91 mg/dL (ref 70–99)
Potassium: 3.2 mEq/L — ABNORMAL LOW (ref 3.5–5.1)

## 2013-03-11 LAB — URINALYSIS, ROUTINE W REFLEX MICROSCOPIC
Ketones, ur: NEGATIVE
Specific Gravity, Urine: 1.02 (ref 1.000–1.030)
Total Protein, Urine: NEGATIVE
Urine Glucose: NEGATIVE
pH: 7 (ref 5.0–8.0)

## 2013-03-11 LAB — HEPATIC FUNCTION PANEL
Bilirubin, Direct: 0.1 mg/dL (ref 0.0–0.3)
Total Bilirubin: 0.7 mg/dL (ref 0.3–1.2)
Total Protein: 7.3 g/dL (ref 6.0–8.3)

## 2013-03-11 LAB — LIPID PANEL
Cholesterol: 187 mg/dL (ref 0–200)
LDL Cholesterol: 103 mg/dL — ABNORMAL HIGH (ref 0–99)
Total CHOL/HDL Ratio: 3

## 2013-03-11 LAB — TSH: TSH: 1.03 u[IU]/mL (ref 0.35–5.50)

## 2013-03-13 ENCOUNTER — Encounter: Payer: Self-pay | Admitting: Internal Medicine

## 2013-03-13 ENCOUNTER — Ambulatory Visit (INDEPENDENT_AMBULATORY_CARE_PROVIDER_SITE_OTHER): Payer: 59 | Admitting: Internal Medicine

## 2013-03-13 VITALS — BP 112/60 | HR 59 | Temp 97.7°F | Ht 62.0 in | Wt 163.8 lb

## 2013-03-13 DIAGNOSIS — R3129 Other microscopic hematuria: Secondary | ICD-10-CM

## 2013-03-13 DIAGNOSIS — E785 Hyperlipidemia, unspecified: Secondary | ICD-10-CM

## 2013-03-13 DIAGNOSIS — R011 Cardiac murmur, unspecified: Secondary | ICD-10-CM

## 2013-03-13 DIAGNOSIS — Z0000 Encounter for general adult medical examination without abnormal findings: Secondary | ICD-10-CM

## 2013-03-13 DIAGNOSIS — I10 Essential (primary) hypertension: Secondary | ICD-10-CM

## 2013-03-13 DIAGNOSIS — D649 Anemia, unspecified: Secondary | ICD-10-CM | POA: Insufficient documentation

## 2013-03-13 MED ORDER — POTASSIUM CHLORIDE CRYS ER 20 MEQ PO TBCR: 20.0000 meq | EXTENDED_RELEASE_TABLET | Freq: Every day | ORAL | Status: DC

## 2013-03-13 NOTE — Assessment & Plan Note (Signed)
Changed to generic ARB-HCTZ 06/2012 -  Increased to BID 10/2012 at pt pref (as opposed to 100/25 qd) - monitor

## 2013-03-13 NOTE — Assessment & Plan Note (Signed)
Mild, normocytic - Denies hx ABL GI eval 07/2011 negative (hx carcinoid) reeval uro issues as above -  Recheck labs in 3-45mo, sooner if issues

## 2013-03-13 NOTE — Assessment & Plan Note (Signed)
Chronic - hx same with prior uro eval summer 2009?: unremarkable per pt after uro testing including cysto - No copy of these records on EMR Refer to uro again now

## 2013-03-13 NOTE — Assessment & Plan Note (Signed)
Self stopped statin 12/2011 due to weight loss with diet changes (daniel fast) Increase in total and LDL  01/2012 reviewed -  Resumed simva 20mg qod 02/2012 Check lipids annually and titrate as needed 

## 2013-03-13 NOTE — Progress Notes (Signed)
Subjective:    Patient ID: Sarah Santos, female    DOB: January 02, 1951, 62 y.o.   MRN: [id number]  HPI  patient is here today for annual physical. Patient feels well overall.  Also reviewed chronic medical issues:  hypertension - started ARB-hctz 02/2012, the patient reports compliance with medication(s) as prescribed. Denies adverse side effects.  dyslipidemia - resumed statin qo 02/2012 - also on low fat diet (Daniel fast) since 12/2011 - denies adverse side effects related to therapy.   Mild hyperparathyroid with hypercalcemia - dx 01/2011 on routine labs - has seen endo for same, conservative mgmt with hydration advised  Past Medical History  Diagnosis Date  . HYPERLIPIDEMIA   . ALLERGIC RHINITIS   . HEADACHE, CHRONIC   . Primary hyperparathyroidism   . Hypercalcemia   . GERD   . Hx of adenomatous colonic polyps   . Hematuria, microscopic   . 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 Hypertension    Family History  Problem Relation Age of Onset  . Heart disease Mother 35    PCI  . Cancer Other     Lung  . Heart disease Other     Coronary Atery Disease  . Colon cancer Neg Hx   . Colon polyps Neg Hx    History  Substance Use Topics  . Smoking status: Former Smoker    Quit date: 03/03/1991  . Smokeless tobacco: Not on file  . Alcohol Use: No    Review of Systems  Constitutional: Negative for fever and fatigue.  Respiratory: Negative for cough and shortness of breath.   Cardiovascular: Negative for chest pain, palpitations and leg swelling.  other systems all reviewed and negative except as listed in HPI above     Objective:   Physical Exam  \BP 112/60  Pulse 59  Temp(Src) 97.7 F (36.5 C) (Oral)  Ht 5\' 2"  (1.575 m)  Wt 163 lb 12.8 oz (74.299 kg)  BMI 29.95 kg/m2  SpO2 97% Wt Readings from Last 3 Encounters:  03/13/13 163 lb 12.8 oz (74.299 kg)  11/16/12 167 lb (75.751 kg)  11/01/12 169 lb 6.4 oz (76.839 kg)    Constitutional: She appears well-developed and well-nourished. No distress.  HENT: Head: Normocephalic and atraumatic. Ears: B TMs ok, no erythema or effusion; Nose: Nose normal. Mouth/Throat: Oropharynx is clear and moist. No oropharyngeal exudate.  Eyes: Conjunctivae and EOM are normal. Pupils are equal, round, and reactive to light. No scleral icterus.  Neck: Normal range of motion. Neck supple. No JVD present. No thyromegaly present.  Cardiovascular: Normal rate, regular rhythm and normal heart sounds.  Soft 2/6 syst murmur heard. No BLE edema. Pulmonary/Chest: Effort normal and breath sounds normal. No respiratory distress. She has no wheezes.  Abdominal: Soft. Bowel sounds are normal. She exhibits no distension. There is no tenderness. no masses Musculoskeletal: Normal range of motion, no joint effusions. No gross deformities Neurological: She is alert and oriented to person, place, and time. No cranial nerve deficit. Coordination normal.  Skin: Skin is warm and dry. No rash noted. No erythema.  Psychiatric: She has a mildly anxious mood and affect. Her behavior is normal. Judgment and thought content normal.   Lab Results  Component Value Date   WBC 5.3 03/11/2013   HGB 10.9* 03/11/2013   HCT 33.1* 03/11/2013   PLT 240.0 03/11/2013   GLUCOSE 91 03/11/2013   CHOL 187 03/11/2013   TRIG 47.0 03/11/2013   HDL 74.50  03/11/2013   LDLDIRECT 156.9 02/01/2012   LDLCALC 103* 03/11/2013   ALT 17 03/11/2013   AST 18 03/11/2013   NA 136 03/11/2013   K 3.2* 03/11/2013   CL 102 03/11/2013   CREATININE 0.8 03/11/2013   BUN 18 03/11/2013   CO2 27 03/11/2013   TSH 1.03 03/11/2013   HGBA1C 5.9 11/23/2009   Nuc stress test 05/03/12: Impression Exercise Capacity:  Fair exercise capacity. BP Response:  Normal blood pressure response. Clinical Symptoms:  No significant symptoms noted. ECG Impression:  No significant ST segment change suggestive of ischemia. Comparison with Prior Nuclear Study: No images to  compare  Overall Impression:  Normal stress nuclear study. LV Ejection Fraction: 72%.  LV Wall Motion:  NL LV Function; NL Wall Motion      Assessment & Plan:   CPX/v70.0 - Patient has been counseled on age-appropriate routine health concerns for screening and prevention. These are reviewed and up-to-date. Immunizations are up-to-date or declined. Labs and ECG reviewed.  Murmur nos - soft systolic present - check 2d echo to eval same and exclude valve dz   Also See problem list. Medications and labs reviewed today.

## 2013-03-13 NOTE — Patient Instructions (Signed)
It was good to see you today. We have reviewed your prior records including labs and tests today Health Maintenance reviewed - consider shingles vaccination (one time vaccination after age 61 to prevent shingles) - all other recommended immunizations and age-appropriate screenings are up-to-date. Medications reviewed and updated, remember to take cholesterol medication as described and resume potassium pill  we'll make referral to Urology to evaluate the microscopic blood in your urine. Our office will contact you regarding appointment(s) once made. Will also refer for ultrasound of your heart to evaluate for possible murmur  Followup in 6 months to review, call sooner if problems

## 2013-03-22 ENCOUNTER — Other Ambulatory Visit (HOSPITAL_COMMUNITY): Payer: 59

## 2013-04-10 ENCOUNTER — Telehealth: Payer: Self-pay | Admitting: Internal Medicine

## 2013-04-10 NOTE — Telephone Encounter (Signed)
Patient Information:  Caller Name: Caya  Phone: [phone]  Patient: Sarah Santos  Gender: Female  DOB: 1951-09-04  Age: 62 Years  PCP: Rene Paci (Adults only)  Office Follow Up:  Does the office need to follow up with this patient?: No  Instructions For The Office: N/A  RN Note:  Reviewed OTC products with patient and advised her to ALWAYS discuss with the pharmacist compatability with her Blood pressure medications. Understanding expressed. Home care instructions and call back parameters reviewed.  Symptoms  Reason For Call & Symptoms: Patient states she is having allergy symptoms onset two weeks ago.   She describes coughing productive white mucus, sneezing, no runny nose, eyes tearing.   She is wanting to know OTC products she can use?  She is currently on Losartan  Reviewed Health History In EMR: Yes  Reviewed Medications In EMR: Yes  Reviewed Allergies In EMR: Yes  Reviewed Surgeries / Procedures: Yes  Date of Onset of Symptoms: 03/27/2013  Treatments Tried: salien spray  Treatments Tried Worked: No  Guideline(s) Used:  Hay Fever - Nasal Allergies  Disposition Per Guideline:   Home Care  Reason For Disposition Reached:   Nasal allergies occur only certain times of year  Advice Given:  Antihistamine Medications for Hay Fever:  Antihistamines help reduce sneezing, itching, and runny nose.  You may need to take antihistamines continuously during pollen season (Reason: continuously is the key to control).  Loratadine is a newer (second generation) antihistamine. The dosage of loratadine (e.g., OTC Claritin, Alavert) is 10 mg once a day.  Cetirizine is a newer (second generation) antihistamine. The dosage of cetirizine (e.g., OTC Zyrtec) is 10 mg once a day.  CAUTION: Antihistamines may cause sleepiness. Do not drink, drive, or operate dangerous machinery while taking antihistamines.  Loratadine and cetirizine cause less sleepiness than  diphenhydramine (Benadryl) or chlorpheniramine (Chlor-Trimeton, Chlor-Tripolon).  Read the package instructions thoroughly on all medications that you take.  Nasal Decongestant Nose Drops for Stuffy Nose:  Antihistamines do not help nasal congestion (stuffiness), but decongestant nose drops do. Decongestants shrink the swollen nasal mucosa and allow for easier breathing.  Phenylephrine nose drops (e.g., Neo-Synephrine) are available over-the-counter. Clean out the nose before using. Spray each nostril once, wait one minute for absorption, and then spray a second time.  CAUTION: Do not take this medication if you have high blood pressure, heart disease, or prostate problems. Do not take these medications if you are pregnant. Do not take these medications if you have used a MAO inhibitor such as isocarboxazid (Marplan), phenelzine (Nardil), rasagiline (Azilect), selegiline (Eldepryl, Emsam), or tranylcypromine (Parnate) in the past 2 weeks. Life-threatening side effects can occur.  Do not use these medications for more than 3 days (Reason: rebound nasal congestion).  Avoiding Pollen:  Stay indoors on windy days  Keep windows closed in home, at least in bedroom; use air conditioner  Use a high efficiency house air filter (HEPA or electrostatic)  Keep windows closed in car, turn AC on recirculate  Call Back If:  Symptoms are not controlled in 2 days with continuous antihistamines  You become worse  Patient Will Follow Care Advice:  YES

## 2013-06-07 ENCOUNTER — Other Ambulatory Visit: Payer: Self-pay | Admitting: Internal Medicine

## 2013-06-11 ENCOUNTER — Other Ambulatory Visit (HOSPITAL_COMMUNITY): Payer: Self-pay | Admitting: Urology

## 2013-06-11 DIAGNOSIS — K769 Liver disease, unspecified: Secondary | ICD-10-CM

## 2013-06-14 ENCOUNTER — Encounter: Payer: Self-pay | Admitting: Internal Medicine

## 2013-06-14 ENCOUNTER — Ambulatory Visit (INDEPENDENT_AMBULATORY_CARE_PROVIDER_SITE_OTHER): Payer: 59 | Admitting: Internal Medicine

## 2013-06-14 VITALS — BP 116/64 | HR 62 | Temp 98.8°F | Ht 61.0 in | Wt 169.0 lb

## 2013-06-14 DIAGNOSIS — R220 Localized swelling, mass and lump, head: Secondary | ICD-10-CM

## 2013-06-14 DIAGNOSIS — R221 Localized swelling, mass and lump, neck: Secondary | ICD-10-CM

## 2013-06-14 NOTE — Patient Instructions (Signed)
Soft Tissue Injury of the Neck A soft tissue injury of the neck may be either blunt or penetrating. A blunt injury does not break the skin. A penetrating injury breaks the skin, creating an open wound. Blunt injuries may happen in several ways. Most involve some type of direct blow to the neck. This can cause serious injury to the windpipe, voice box, cervical spine, or esophagus. In some cases, the injury to the soft tissue can also result in a break (fracture) of the cervical spine.  Soft tissue injuries of the neck require immediate medical care. Sometimes, you may not notice the signs of injury right away. You may feel fine at first, but the swelling may eventually close off your airway. This could result in a significant or life-threatening injury. This is rare, but it is important to keep in mind with any injury to the neck.  CAUSES  Causes of blunt injury may include:  "Clothesline" injuries. This happens when someone is moving at high speed and runs into a clothesline, outstretched arm, or similar object. This results in a direct injury to the front of the neck. If the airway is blocked, it can cause suffocation due to lack of oxygen (asphyxiation) or even instant death.  High-energy trauma. This includes injuries from motor vehicle crashes, falling from a great height, or heavy objects falling onto the neck.  Sports-related injuries. Injury to the windpipe and voice box can result from being struck by another player or being struck by an object, such as a baseball, hockey stick, or an outstretched arm.  Strangulation. This type of injury may cause skin trauma, hoarseness of voice, or broken cartilage in the voice box or windpipe. It may also cause a serious airway problem. SYMPTOMS   Bruising.  Pain and tenderness in the neck.  Swelling of the neck and face.  Hoarseness of voice.  Pain or difficulty with swallowing.  Drooling or inability to swallow.  Trouble breathing. This may  become worse when lying flat.  Coughing up blood.  High-pitched, harsh, vibratory noise due to partial obstruction of the windpipe (stridor).  Swelling of the upper arms.  Windpipe that appears to be pushed off to one side.  Air in the tissues under the skin of the neck or chest (subcutaneous emphysema). This usually indicates a problem with the normal airway and is a medical emergency. DIAGNOSIS   If possible, your caregiver may ask about the details of how the injury occurred. A detailed exam can help to identify specific areas of the neck that are injured.  Your caregiver may ask for tests to rule out injury of the voice box, airway, or esophagus. This may include X-rays, ultrasounds, CT scans, or MRI scans, depending on the severity of your injury. TREATMENT  If you have an injury to your windpipe or voice box, immediate medical care is required. In almost all cases, hospitalization is necessary. For injuries that do not appear to require surgery, it is helpful to have medical observation for 24 hours. You may be asked to do one or more of the following:  Rest your voice.  Bed rest.  Limit your diet, depending on the extent of the injury. Follow your caregiver's dietary guidelines. Often, only fluids and soft foods are recommended.  Keep your head raised.  Breathe humidified air.  Take medicines to control infection, reduce swelling, and reduce normal stomach acid. You may also need pain medicine, depending on your injury. For injuries that appear to require surgery,   you will need to stay in the hospital. The exact type of procedure needed will depend on your exact injury or injuries.  HOME CARE INSTRUCTIONS   If the skin was broken, keep the wound area clean and dry. Wear your bandage (dressing) and care for your wound as instructed.  Follow your caregiver's advice about your diet.  Follow your caregiver's advice about use of your voice.  Take medicines as  directed.  Keep your head and neck at least partially raised (elevated) while recovering. This should also be done while sleeping. SEEK MEDICAL CARE IF:   Your voice becomes weaker.  Your swelling or bruising is not improving as expected. Typically, this takes several days to improve.  You feel that you are having problems with medicines prescribed.  You have drainage from the injury site. This may be a sign that your wound is not healing properly or is infected.  You develop increasing pain or difficulty while swallowing.  You develop an oral temperature of 102 F (38.9 C) or higher. SEEK IMMEDIATE MEDICAL CARE IF:   You cough up blood.  You develop sudden trouble breathing.  You cannot tolerate your oral medicines, or you are unable to swallow.  You develop drooling.  You have new or worsening vomiting.  You develop sudden, new swelling of the neck or face.  You have an oral temperature above 102 F (38.9 C), not controlled by medicine. MAKE SURE YOU:  Understand these instructions.  Will watch your condition.  Will get help right away if you are not doing well or get worse. Document Released: 03/20/2008 Document Revised: 03/05/2012 Document Reviewed: 02/28/2011 [employer] Patient Information 2014 [employer]  

## 2013-06-14 NOTE — Progress Notes (Signed)
Subjective:    Patient ID: Sarah Santos, female    DOB: 1951-10-30, 62 y.o.   MRN: [id number]  HPI  Pt presents to the clinic today with c/o a lump on her neck. This is located on the right side of her neck. She first noticed this in may. It feels about the size of a quarter. It has not increased in size. It is not painful. She has not had any injury to the neck that she is aware of. She has no family history of lymphoma. She has not had any recent viral or bacterial illnesses.  Review of Systems  Past Medical History  Diagnosis Date  . HYPERLIPIDEMIA   . ALLERGIC RHINITIS   . HEADACHE, CHRONIC   . Primary hyperparathyroidism   . Hypercalcemia   . GERD   . Hx of adenomatous colonic polyps   . Hematuria, microscopic   . 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 Hypertension     Current Outpatient Prescriptions  Medication Sig Dispense Refill  . ALPRAZolam (XANAX) 0.5 MG tablet Take 1 tablet (0.5 mg total) by mouth at bedtime as needed for sleep.  30 tablet  1  . clotrimazole-betamethasone (LOTRISONE) cream Apply 1 application topically as needed. Tinea pedis      . ibuprofen (ADVIL,MOTRIN) 200 MG tablet Take 200 mg by mouth every 6 (six) hours as needed. Headache      . losartan-hydrochlorothiazide (HYZAAR) 50-12.5 MG per tablet Take 1 tablet by mouth 2 (two) times daily.  60 tablet  5  . potassium chloride SA (K-DUR,KLOR-CON) 20 MEQ tablet Take 1 tablet (20 mEq total) by mouth daily.  30 tablet  5  . simvastatin (ZOCOR) 20 MG tablet TAKE 1 TABLET BY MOUTH AT BEDTIME  90 tablet  PRN   No current facility-administered medications for this visit.    No Known Allergies  Family History  Problem Relation Age of Onset  . Heart disease Mother 72    PCI  . Cancer Other     Lung  . Heart disease Other     Coronary Atery Disease  . Colon cancer Neg Hx   . Colon polyps Neg Hx     History   Social History  . Marital Status: Single   Spouse Name: N/A    Number of Children: N/A  . Years of Education: N/A   Occupational History  . Not on file.   Social History Main Topics  . Smoking status: Former Smoker    Quit date: 03/03/1991  . Smokeless tobacco: Not on file  . Alcohol Use: No  . Drug Use: No  . Sexually Active: Not on file   Other Topics Concern  . 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  Respiratory: Denies difficulty breathing, shortness of breath, cough or sputum production.   Cardiovascular: Denies chest pain, chest tightness, palpitations or swelling in the hands or feet.  Musculoskeletal: Denies decrease in range of motion, difficulty with gait, muscle pain or joint pain and swelling.  Skin: Pt reports a lump on her neck. Denies redness, rashes, lesions or ulcercations.    No other specific complaints in a complete review of systems (except as listed in HPI above).     Objective:   Physical Exam   BP 116/64  Pulse 62  Temp(Src) 98.8 F (  37.1 C) (Oral)  Ht 5\' 1"  (1.549 m)  Wt 169 lb (76.658 kg)  BMI 31.95 kg/m2  SpO2 99% Wt Readings from Last 3 Encounters:  06/14/13 169 lb (76.658 kg)  03/13/13 163 lb 12.8 oz (74.299 kg)  11/16/12 167 lb (75.751 kg)    General: Appears her stated age, well developed, well nourished in NAD. Skin: Warm, dry and intact. No rashes, lesions or ulcerations noted..  Neck: Normal range of motion. Neck supple, trachea midline. No thyromegaly present.  Quarter sized lump noted on right side of neck just below the mandibular angle. Cardiovascular: Normal rate and rhythm. S1,S2 noted.  No murmur, rubs or gallops noted. No JVD or BLE edema. No carotid bruits noted. Pulmonary/Chest: Normal effort and positive vesicular breath sounds. No respiratory distress. No wheezes, rales or ronchi noted.  Musculoskeletal: Normal range of  motion. No signs of joint swelling. No difficulty with gait.    BMET    Component Value Date/Time   NA 136 03/11/2013 0815   K 3.2* 03/11/2013 0815   CL 102 03/11/2013 0815   CO2 27 03/11/2013 0815   GLUCOSE 91 03/11/2013 0815   BUN 18 03/11/2013 0815   CREATININE 0.8 03/11/2013 0815   CALCIUM 10.1 03/11/2013 0815   CALCIUM 11.0* 08/26/2011 1618   GFRNONAA 69* 11/03/2012 1815   GFRAA 80* 11/03/2012 1815    Lipid Panel     Component Value Date/Time   CHOL 187 03/11/2013 0815   TRIG 47.0 03/11/2013 0815   HDL 74.50 03/11/2013 0815   CHOLHDL 3 03/11/2013 0815   VLDL 9.4 03/11/2013 0815   LDLCALC 103* 03/11/2013 0815    CBC    Component Value Date/Time   WBC 5.3 03/11/2013 0815   RBC 4.06 03/11/2013 0815   HGB 10.9* 03/11/2013 0815   HCT 33.1* 03/11/2013 0815   PLT 240.0 03/11/2013 0815   MCV 81.5 03/11/2013 0815   MCH 26.7 11/03/2012 1815   MCHC 32.9 03/11/2013 0815   RDW 13.2 03/11/2013 0815   LYMPHSABS 2.6 03/11/2013 0815   MONOABS 0.5 03/11/2013 0815   EOSABS 0.1 03/11/2013 0815   BASOSABS 0.0 03/11/2013 0815    Hgb A1C Lab Results  Component Value Date   HGBA1C 5.9 11/23/2009        Assessment & Plan:   Lump on right side of neck:  Will get ultrasound to r/o cyst vs lymphadenitis vs other soft tissue mass  Will f/u after ultrasound

## 2013-06-21 ENCOUNTER — Ambulatory Visit
Admission: RE | Admit: 2013-06-21 | Discharge: 2013-06-21 | Disposition: A | Discharge status: Home / Self Care | Payer: 59 | Source: Ambulatory Visit | Attending: Internal Medicine | Admitting: Internal Medicine

## 2013-06-21 ENCOUNTER — Ambulatory Visit (HOSPITAL_COMMUNITY)
Admission: RE | Admit: 2013-06-21 | Discharge: 2013-06-21 | Disposition: A | Discharge status: Home / Self Care | Payer: 59 | Source: Ambulatory Visit | Attending: Urology | Admitting: Urology

## 2013-06-21 ENCOUNTER — Other Ambulatory Visit: Payer: Self-pay | Admitting: Internal Medicine

## 2013-06-21 DIAGNOSIS — K7689 Other specified diseases of liver: Secondary | ICD-10-CM | POA: Insufficient documentation

## 2013-06-21 DIAGNOSIS — R220 Localized swelling, mass and lump, head: Secondary | ICD-10-CM

## 2013-06-21 DIAGNOSIS — K769 Liver disease, unspecified: Secondary | ICD-10-CM

## 2013-06-21 DIAGNOSIS — R221 Localized swelling, mass and lump, neck: Secondary | ICD-10-CM

## 2013-06-21 DIAGNOSIS — D1803 Hemangioma of intra-abdominal structures: Secondary | ICD-10-CM | POA: Insufficient documentation

## 2013-06-21 LAB — CREATININE, SERUM: GFR calc Af Amer: >90 mL/min (ref >90)

## 2013-06-21 MED ORDER — GADOBENATE DIMEGLUMINE 529 MG/ML IV SOLN
15.0000 mL | Freq: Once | INTRAVENOUS | Status: AC | PRN
  Administered 2013-06-21: 15 mL via INTRAVENOUS

## 2013-06-26 ENCOUNTER — Other Ambulatory Visit: Payer: 59

## 2013-07-17 ENCOUNTER — Ambulatory Visit (INDEPENDENT_AMBULATORY_CARE_PROVIDER_SITE_OTHER): Payer: 59 | Admitting: Internal Medicine

## 2013-07-17 ENCOUNTER — Encounter: Payer: Self-pay | Admitting: Internal Medicine

## 2013-07-17 VITALS — BP 140/72 | HR 79 | Temp 98.3°F | Wt 162.8 lb

## 2013-07-17 DIAGNOSIS — K118 Other diseases of salivary glands: Secondary | ICD-10-CM

## 2013-07-17 DIAGNOSIS — R221 Localized swelling, mass and lump, neck: Secondary | ICD-10-CM

## 2013-07-17 DIAGNOSIS — I10 Essential (primary) hypertension: Secondary | ICD-10-CM

## 2013-07-17 DIAGNOSIS — R220 Localized swelling, mass and lump, head: Secondary | ICD-10-CM

## 2013-07-17 HISTORY — DX: Other diseases of salivary glands: K11.8

## 2013-07-17 NOTE — Patient Instructions (Addendum)
It was good to see you today. continue blood pressure medication to twice daily as discussed - Call if SBP>140 or <100 followup for CT scan of right neck swelling as discussed Please schedule followup in 6 months for physical and labs, call sooner if problems.

## 2013-07-17 NOTE — Assessment & Plan Note (Signed)
BP Readings from Last 3 Encounters:  07/17/13 140/72  06/14/13 116/64  03/13/13 112/60   Changed to generic ARB-HCTZ 06/2012 -  Increased to BID 10/2012 at pt pref (as opposed to 100/25 qd) - Recent variability with dose changes reviewed Encouraged to comply with twice a day dosing as prescribed Patient will monitor home blood pressures and notify us if systolic greater than 140 or under 100

## 2013-07-17 NOTE — Progress Notes (Signed)
Subjective:    Patient ID: Sarah Santos, female    DOB: 20-Oct-1951, 62 y.o.   MRN: [id number]  Hypertension Pertinent negatives include no chest pain, palpitations or shortness of breath.   also reviewed chronic medical issues and interval medical events ?neck lump on R side, [place] 06/21/13 reviewed - ?CT neck   Past Medical History  Diagnosis Date  . HYPERLIPIDEMIA   . ALLERGIC RHINITIS   . HEADACHE, CHRONIC   . Primary hyperparathyroidism   . Hypercalcemia   . GERD   . Hx of adenomatous colonic polyps   . Hematuria, microscopic   . 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 Hypertension     Review of Systems  Constitutional: Negative for fever and fatigue.  Respiratory: Negative for cough and shortness of breath.   Cardiovascular: Negative for chest pain, palpitations and leg swelling.       Objective:   Physical Exam  BP 140/72  Pulse 79  Temp(Src) 98.3 F (36.8 C) (Oral)  Wt 162 lb 12.8 oz (73.846 kg)  BMI 30.78 kg/m2  SpO2 97% Wt Readings from Last 3 Encounters:  07/17/13 162 lb 12.8 oz (73.846 kg)  06/14/13 169 lb (76.658 kg)  03/13/13 163 lb 12.8 oz (74.299 kg)   Constitutional: She appears well-developed and well-nourished. No distress.  Neck: Normal range of motion. Neck supple. No JVD present. No thyromegaly present.  Cardiovascular: Normal rate, regular rhythm and normal heart sounds.  Soft 2/6 syst murmur heard. No BLE edema. Pulmonary/Chest: Effort normal and breath sounds normal. No respiratory distress. She has no wheezes.  Neurological: She is alert and oriented to person, place, and time. No cranial nerve deficit. Coordination normal.  Psychiatric: She has a mildly anxious mood and affect. Her behavior is normal. Judgment and thought content normal.   Lab Results  Component Value Date   WBC 5.3 03/11/2013   HGB 10.9* 03/11/2013   HCT 33.1* 03/11/2013   PLT 240.0 03/11/2013   GLUCOSE 91 03/11/2013   CHOL 187  03/11/2013   TRIG 47.0 03/11/2013   HDL 74.50 03/11/2013   LDLDIRECT 156.9 02/01/2012   LDLCALC 103* 03/11/2013   ALT 17 03/11/2013   AST 18 03/11/2013   NA 136 03/11/2013   K 3.2* 03/11/2013   CL 102 03/11/2013   CREATININE 0.78 06/21/2013   BUN 18 03/11/2013   CO2 27 03/11/2013   TSH 1.03 03/11/2013   HGBA1C 5.9 11/23/2009   Nuc stress test 05/03/12: Impression Exercise Capacity:  Fair exercise capacity. BP Response:  Normal blood pressure response. Clinical Symptoms:  No significant symptoms noted. ECG Impression:  No significant ST segment change suggestive of ischemia. Comparison with Prior Nuclear Study: No images to compare  Overall Impression:  Normal stress nuclear study. LV Ejection Fraction: 72%.  LV Wall Motion:  NL LV Function; NL Wall Motion  Mr Abdomen W Wo Contrast  06/22/2013   *RADIOLOGY REPORT*  Clinical Data: Pain with the wound lesion of the duodenum.  Liver lesion on prior outside CT scan  MRI ABDOMEN WITH AND WITHOUT CONTRAST  Technique:  Multiplanar multisequence MR imaging of the abdomen was performed both before and after administration of intravenous contrast.  Contrast: 15mL MULTIHANCE GADOBENATE DIMEGLUMINE 529 MG/ML IV SOLN  Comparison: 06/10/2013  Findings: Approximately eight small lesions which are T2 hyperintense and T1 hypointense are present in the liver, without associated abnormal enhancement, favoring cysts.  The largest is in segment 4B of the liver  and measures 1.4 x 2.2 cm.  The  A 5 mm enhancing lesion in segment 4A of the liver shown on image 25 of series 1302 is most compatible with a small hemangioma.  The spleen, pancreas, and kidneys appear unremarkable.  No abnormal adenopathy observed.  Adrenal glands unremarkable.  IMPRESSION:  1.  Multiple hepatic cysts appear benign.  Tiny hepatic hemangioma also appears benign.   Original Report Authenticated By: Gaylyn Rong, M.D.   US Soft Tissue Head/neck  06/21/2013   *RADIOLOGY REPORT*  Clinical Data:  Palpable lump over the right mandibular angle  ULTRASOUND OF HEAD/NECK SOFT TISSUES  Technique:  Ultrasound examination of the head and neck soft tissues was performed in the area of clinical concern.  Comparison:  None.  Findings: Ultrasound of the right mandibular angle - parotid gland was performed.  Within the parotid gland overlying the right mandibular angle, there is a hypoechoic complex structure with internal echogenicity, through transmission, and some internal blood flow.  This area measures 1.3 x 1.0 x 1.4 cm.  Ultrasound over the left parotid gland was performed showing a normal homogeneous soft tissue with no similar abnormality. This lesion is worrisome for a possible primary parotid malignancy such as a malignant pleomorphic adenoma, not having the typical appearance of necrotic lymph node.  CT with IV contrast media is recommended for further assessment.  IMPRESSION: Irregular complex lesion within the right parotid gland with some internal blood flow worrisome for malignancy.  Recommend CT with IV contrast media.   Original Report Authenticated By: Dwyane Dee, M.D.     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 Medications and labs reviewed today.

## 2013-08-09 ENCOUNTER — Other Ambulatory Visit: Payer: Self-pay | Admitting: Internal Medicine

## 2013-08-14 ENCOUNTER — Telehealth: Payer: Self-pay | Admitting: Internal Medicine

## 2013-08-14 NOTE — Telephone Encounter (Signed)
The patient is hoping to schedule a referral appointment as soon as possible.  She states she left a message on the patient care coordinator voice mail.  Thanks!

## 2013-08-19 ENCOUNTER — Other Ambulatory Visit: Payer: Self-pay | Admitting: *Deleted

## 2013-08-19 MED ORDER — PANTOPRAZOLE SODIUM 40 MG PO TBEC: 40.0000 mg | DELAYED_RELEASE_TABLET | Freq: Every day | ORAL | Status: DC

## 2013-08-19 NOTE — Telephone Encounter (Signed)
Requesting updated rx for pt protonix. Was transferred from another pharmacy. But refills was expired...lmb

## 2013-08-21 ENCOUNTER — Ambulatory Visit (INDEPENDENT_AMBULATORY_CARE_PROVIDER_SITE_OTHER)
Admission: RE | Admit: 2013-08-21 | Discharge: 2013-08-21 | Disposition: A | Discharge status: Home / Self Care | Payer: 59 | Source: Ambulatory Visit | Attending: Internal Medicine | Admitting: Internal Medicine

## 2013-08-21 DIAGNOSIS — R220 Localized swelling, mass and lump, head: Secondary | ICD-10-CM

## 2013-08-21 DIAGNOSIS — R221 Localized swelling, mass and lump, neck: Secondary | ICD-10-CM

## 2013-08-21 MED ORDER — IOHEXOL 300 MG/ML  SOLN
76.0000 mL | Freq: Once | INTRAMUSCULAR | Status: AC | PRN
  Administered 2013-08-21: 76 mL via INTRAVENOUS

## 2013-10-18 ENCOUNTER — Other Ambulatory Visit: Payer: Self-pay | Admitting: Obstetrics and Gynecology

## 2013-10-18 DIAGNOSIS — R921 Mammographic calcification found on diagnostic imaging of breast: Secondary | ICD-10-CM

## 2013-10-31 ENCOUNTER — Other Ambulatory Visit: Payer: Self-pay

## 2013-11-08 ENCOUNTER — Ambulatory Visit
Admission: RE | Admit: 2013-11-08 | Discharge: 2013-11-08 | Disposition: A | Discharge status: Home / Self Care | Payer: 59 | Source: Ambulatory Visit | Attending: Obstetrics and Gynecology | Admitting: Obstetrics and Gynecology

## 2013-11-08 DIAGNOSIS — R921 Mammographic calcification found on diagnostic imaging of breast: Secondary | ICD-10-CM

## 2013-12-16 ENCOUNTER — Encounter: Payer: Self-pay | Admitting: Internal Medicine

## 2013-12-16 ENCOUNTER — Ambulatory Visit (INDEPENDENT_AMBULATORY_CARE_PROVIDER_SITE_OTHER): Payer: 59 | Admitting: Internal Medicine

## 2013-12-16 ENCOUNTER — Other Ambulatory Visit (INDEPENDENT_AMBULATORY_CARE_PROVIDER_SITE_OTHER): Payer: 59

## 2013-12-16 VITALS — BP 110/62 | HR 66 | Temp 97.8°F | Wt 169.0 lb

## 2013-12-16 DIAGNOSIS — R5383 Other fatigue: Secondary | ICD-10-CM

## 2013-12-16 DIAGNOSIS — D649 Anemia, unspecified: Secondary | ICD-10-CM

## 2013-12-16 DIAGNOSIS — I10 Essential (primary) hypertension: Secondary | ICD-10-CM

## 2013-12-16 DIAGNOSIS — R5381 Other malaise: Secondary | ICD-10-CM

## 2013-12-16 LAB — CBC WITH DIFFERENTIAL/PLATELET
Eosinophils Absolute: 0.1 10*3/uL (ref 0.0–0.7)
Lymphocytes Relative: 44 % (ref 12.0–46.0)
MCHC: 33.2 g/dL (ref 30.0–36.0)
MCV: 79.5 fl (ref 78.0–100.0)
Monocytes Absolute: 0.5 10*3/uL (ref 0.1–1.0)
Neutrophils Relative %: 45.6 % (ref 43.0–77.0)
Platelets: 253 10*3/uL (ref 150.0–400.0)
RBC: 4.43 Mil/uL (ref 3.87–5.11)
WBC: 5.7 10*3/uL (ref 4.5–10.5)

## 2013-12-16 LAB — BASIC METABOLIC PANEL
BUN: 19 mg/dL (ref 6–23)
Calcium: 11 mg/dL — ABNORMAL HIGH (ref 8.4–10.5)
Chloride: 99 mEq/L (ref 96–112)
GFR: 89.63 mL/min (ref >60.00)
Potassium: 3.6 mEq/L (ref 3.5–5.1)
Sodium: 137 mEq/L (ref 135–145)

## 2013-12-16 LAB — FERRITIN: Ferritin: 46.8 ng/mL (ref 10.0–291.0)

## 2013-12-16 LAB — TSH: TSH: 0.7 u[IU]/mL (ref 0.35–5.50)

## 2013-12-16 MED ORDER — POTASSIUM CHLORIDE CRYS ER 20 MEQ PO TBCR: 20.0000 meq | EXTENDED_RELEASE_TABLET | ORAL | Status: DC

## 2013-12-16 NOTE — Progress Notes (Signed)
Pre-visit discussion using our clinic review tool. No additional management support is needed unless otherwise documented below in the visit note.  

## 2013-12-16 NOTE — Patient Instructions (Addendum)
It was good to see you today.  We have reviewed your prior records including labs and tests today  Test(s) ordered today. Your results will be released to MyChart (or called to you) after review, usually within 72hours after test completion. If any changes need to be made, you will be notified at that same time.  Medications reviewed and updated, no changes recommended at this time.  Keep follow up in March/April for annual exam and labs - call sooner if problems  Fatigue Fatigue is a feeling of tiredness, lack of energy, lack of motivation, or feeling tired all the time. Having enough rest, good nutrition, and reducing stress will normally reduce fatigue. Consult your caregiver if it persists. The nature of your fatigue will help your caregiver to find out its cause. The treatment is based on the cause.  CAUSES  There are many causes for fatigue. Most of the time, fatigue can be traced to one or more of your habits or routines. Most causes fit into one or more of three general areas. They are: Lifestyle problems  Sleep disturbances.  Overwork.  Physical exertion.  Unhealthy habits.  Poor eating habits or eating disorders.  Alcohol and/or drug use .  Lack of proper nutrition (malnutrition). Psychological problems  Stress and/or anxiety problems.  Depression.  Grief.  Boredom. Medical Problems or Conditions  Anemia.  Pregnancy.  Thyroid gland problems.  Recovery from major surgery.  Continuous pain.  Emphysema or asthma that is not well controlled  Allergic conditions.  Diabetes.  Infections (such as mononucleosis).  Obesity.  Sleep disorders, such as sleep apnea.  Heart failure or other heart-related problems.  Cancer.  Kidney disease.  Liver disease.  Effects of certain medicines such as antihistamines, cough and cold remedies, prescription pain medicines, heart and blood pressure medicines, drugs used for treatment of cancer, and some  antidepressants. SYMPTOMS  The symptoms of fatigue include:   Lack of energy.  Lack of drive (motivation).  Drowsiness.  Feeling of indifference to the surroundings. DIAGNOSIS  The details of how you feel help guide your caregiver in finding out what is causing the fatigue. You will be asked about your present and past health condition. It is important to review all medicines that you take, including prescription and non-prescription items. A thorough exam will be done. You will be questioned about your feelings, habits, and normal lifestyle. Your caregiver may suggest blood tests, urine tests, or other tests to look for common medical causes of fatigue.  TREATMENT  Fatigue is treated by correcting the underlying cause. For example, if you have continuous pain or depression, treating these causes will improve how you feel. Similarly, adjusting the dose of certain medicines will help in reducing fatigue.  HOME CARE INSTRUCTIONS   Try to get the required amount of good sleep every night.  Eat a healthy and nutritious diet, and drink enough water throughout the day.  Practice ways of relaxing (including yoga or meditation).  Exercise regularly.  Make plans to change situations that cause stress. Act on those plans so that stresses decrease over time. Keep your work and personal routine reasonable.  Avoid street drugs and minimize use of alcohol.  Start taking a daily multivitamin after consulting your caregiver. SEEK MEDICAL CARE IF:   You have persistent tiredness, which cannot be accounted for.  You have fever.  You have unintentional weight loss.  You have headaches.  You have disturbed sleep throughout the night.  You are feeling sad.  You  have constipation.  You have dry skin.  You have gained weight.  You are taking any new or different medicines that you suspect are causing fatigue.  You are unable to sleep at night.  You develop any unusual swelling of your  legs or other parts of your body. SEEK IMMEDIATE MEDICAL CARE IF:   You are feeling confused.  Your vision is blurred.  You feel faint or pass out.  You develop severe headache.  You develop severe abdominal, pelvic, or back pain.  You develop chest pain, shortness of breath, or an irregular or fast heartbeat.  You are unable to pass a normal amount of urine.  You develop abnormal bleeding such as bleeding from the rectum or you vomit blood.  You have thoughts about harming yourself or committing suicide.  You are worried that you might harm someone else. MAKE SURE YOU:   Understand these instructions.  Will watch your condition.  Will get help right away if you are not doing well or get worse. Document Released: 10/09/2007 Document Revised: 03/05/2012 Document Reviewed: 10/09/2007 [facility] Patient Information 2014 [place], [place].

## 2013-12-16 NOTE — Progress Notes (Signed)
   Subjective:    Patient ID: Sarah Santos, female    DOB: 22-Jun-1951, 62 y.o.   MRN: [id number]  HPI  complains of fatigue associated with cold hands and sensitivity to cold weather  Past Medical History  Diagnosis Date  . HYPERLIPIDEMIA   . ALLERGIC RHINITIS   . HEADACHE, CHRONIC   . Primary hyperparathyroidism   . Hypercalcemia   . GERD   . Hx of adenomatous colonic polyps   . Hematuria, microscopic   . 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 Hypertension     Review of Systems  Constitutional: Positive for fatigue. Negative for fever, activity change, appetite change and unexpected weight change.  Respiratory: Negative for cough and shortness of breath.   Cardiovascular: Negative for chest pain and leg swelling.  Endocrine: Positive for cold intolerance.       Objective:   Physical Exam BP 110/62  Pulse 66  Temp(Src) 97.8 F (36.6 C) (Oral)  Wt 169 lb (76.658 kg)  SpO2 97% Wt Readings from Last 3 Encounters:  12/16/13 169 lb (76.658 kg)  07/17/13 162 lb 12.8 oz (73.846 kg)  06/14/13 169 lb (76.658 kg)   Constitutional: She appears well-developed and well-nourished. No distress.  Neck: Normal range of motion. Neck supple. No JVD present. No thyromegaly present.  Cardiovascular: Normal rate, regular rhythm and normal heart sounds.  No murmur heard. No BLE edema. Pulmonary/Chest: Effort normal and breath sounds normal. No respiratory distress. She has no wheezes.  Neurological: She is alert and oriented to person, place, and time. No cranial nerve deficit. Coordination, balance, strength, speech and gait are normal.  Skin: Skin is warm and dry. No rash noted. No erythema.  Psychiatric: She has a normal mood and affect. Her behavior is normal. Judgment and thought content normal.   Lab Results  Component Value Date   WBC 5.3 03/11/2013   HGB 10.9* 03/11/2013   HCT 33.1* 03/11/2013   PLT 240.0 03/11/2013   GLUCOSE 91  03/11/2013   CHOL 187 03/11/2013   TRIG 47.0 03/11/2013   HDL 74.50 03/11/2013   LDLDIRECT 156.9 02/01/2012   LDLCALC 103* 03/11/2013   ALT 17 03/11/2013   AST 18 03/11/2013   NA 136 03/11/2013   K 3.2* 03/11/2013   CL 102 03/11/2013   CREATININE 0.78 06/21/2013   BUN 18 03/11/2013   CO2 27 03/11/2013   TSH 1.03 03/11/2013   HGBA1C 5.9 11/23/2009   No results found for this basename: FERRITIN   No results found for this basename: VITAMINB12        Assessment & Plan:   Fatigue - nonspecific symptoms/exam - check screening labs  also See problem list. Medications and labs reviewed today.

## 2013-12-16 NOTE — Assessment & Plan Note (Signed)
Mild, normocytic - Denies hx ABL GI eval 07/2011 negative (hx carcinoid); gynecology eval summer 2014 ok Microhematuria eval by uro summer 2014 - unremarkable Recheck labs now given increase fatigue - sooner if issues

## 2013-12-16 NOTE — Assessment & Plan Note (Signed)
BP Readings from Last 3 Encounters:  12/16/13 110/62  07/17/13 140/72  06/14/13 116/64   Changed to generic ARB-HCTZ 06/2012 -  Increased to BID 10/2012 at pt pref (as opposed to 100/25 qd) - Recent variability with dose changes reviewed Encouraged to comply with twice a day dosing as prescribed Patient will monitor home blood pressures and notify us if systolic greater than 140 or under 100

## 2014-02-20 ENCOUNTER — Other Ambulatory Visit: Payer: Self-pay | Admitting: Internal Medicine

## 2014-03-07 ENCOUNTER — Encounter: Payer: 59 | Admitting: Internal Medicine

## 2014-03-22 ENCOUNTER — Emergency Department (HOSPITAL_COMMUNITY): Payer: 59

## 2014-03-22 ENCOUNTER — Encounter (HOSPITAL_COMMUNITY): Payer: Self-pay | Admitting: Emergency Medicine

## 2014-03-22 ENCOUNTER — Emergency Department (HOSPITAL_COMMUNITY)
Admission: EM | Admit: 2014-03-22 | Discharge: 2014-03-22 | Disposition: A | Discharge status: Home / Self Care | Payer: 59 | Attending: Emergency Medicine | Admitting: Emergency Medicine

## 2014-03-22 ENCOUNTER — Ambulatory Visit (INDEPENDENT_AMBULATORY_CARE_PROVIDER_SITE_OTHER): Payer: 59 | Admitting: Family Medicine

## 2014-03-22 ENCOUNTER — Encounter: Payer: Self-pay | Admitting: Family Medicine

## 2014-03-22 VITALS — BP 140/76 | HR 92 | Temp 98.1°F | Resp 20 | Ht 61.0 in | Wt 169.1 lb

## 2014-03-22 DIAGNOSIS — Z79899 Other long term (current) drug therapy: Secondary | ICD-10-CM | POA: Insufficient documentation

## 2014-03-22 DIAGNOSIS — G8929 Other chronic pain: Secondary | ICD-10-CM | POA: Insufficient documentation

## 2014-03-22 DIAGNOSIS — Z860100 Personal history of colon polyps, unspecified: Secondary | ICD-10-CM | POA: Insufficient documentation

## 2014-03-22 DIAGNOSIS — R1900 Intra-abdominal and pelvic swelling, mass and lump, unspecified site: Secondary | ICD-10-CM

## 2014-03-22 DIAGNOSIS — M549 Dorsalgia, unspecified: Secondary | ICD-10-CM | POA: Insufficient documentation

## 2014-03-22 DIAGNOSIS — I10 Essential (primary) hypertension: Secondary | ICD-10-CM | POA: Insufficient documentation

## 2014-03-22 DIAGNOSIS — E785 Hyperlipidemia, unspecified: Secondary | ICD-10-CM | POA: Insufficient documentation

## 2014-03-22 DIAGNOSIS — Z8709 Personal history of other diseases of the respiratory system: Secondary | ICD-10-CM | POA: Insufficient documentation

## 2014-03-22 DIAGNOSIS — K219 Gastro-esophageal reflux disease without esophagitis: Secondary | ICD-10-CM | POA: Insufficient documentation

## 2014-03-22 DIAGNOSIS — Z8742 Personal history of other diseases of the female genital tract: Secondary | ICD-10-CM | POA: Insufficient documentation

## 2014-03-22 DIAGNOSIS — Z87891 Personal history of nicotine dependence: Secondary | ICD-10-CM | POA: Insufficient documentation

## 2014-03-22 LAB — COMPREHENSIVE METABOLIC PANEL
ALBUMIN: 4 g/dL (ref 3.5–5.2)
ALT: 15 U/L (ref 0–35)
AST: 18 U/L (ref 0–37)
Alkaline Phosphatase: 86 U/L (ref 39–117)
BUN: 26 mg/dL — AB (ref 6–23)
CALCIUM: 11.2 mg/dL — AB (ref 8.4–10.5)
CO2: 26 mEq/L (ref 19–32)
CREATININE: 1.03 mg/dL (ref 0.50–1.10)
Chloride: 95 mEq/L — ABNORMAL LOW (ref 96–112)
GFR calc Af Amer: 66 mL/min — ABNORMAL LOW (ref >90)
GFR calc non Af Amer: 57 mL/min — ABNORMAL LOW (ref >90)
Glucose, Bld: 106 mg/dL — ABNORMAL HIGH (ref 70–99)
Potassium: 3.9 mEq/L (ref 3.7–5.3)
Sodium: 134 mEq/L — ABNORMAL LOW (ref 137–147)
Total Bilirubin: 0.3 mg/dL (ref 0.3–1.2)
Total Protein: 7.7 g/dL (ref 6.0–8.3)

## 2014-03-22 LAB — URINALYSIS, ROUTINE W REFLEX MICROSCOPIC
Bilirubin Urine: NEGATIVE
GLUCOSE, UA: NEGATIVE mg/dL
Ketones, ur: NEGATIVE mg/dL
LEUKOCYTES UA: NEGATIVE
Nitrite: NEGATIVE
Protein, ur: NEGATIVE mg/dL
SPECIFIC GRAVITY, URINE: 1.008 (ref 1.005–1.030)
Urobilinogen, UA: 0.2 mg/dL (ref 0.0–1.0)
pH: 6 (ref 5.0–8.0)

## 2014-03-22 LAB — CBC
HCT: 33.2 % — ABNORMAL LOW (ref 36.0–46.0)
Hemoglobin: 11 g/dL — ABNORMAL LOW (ref 12.0–15.0)
MCH: 26.4 pg (ref 26.0–34.0)
MCHC: 33.1 g/dL (ref 30.0–36.0)
MCV: 79.8 fL (ref 78.0–100.0)
PLATELETS: 254 10*3/uL (ref 150–400)
RBC: 4.16 MIL/uL (ref 3.87–5.11)
RDW: 13.3 % (ref 11.5–15.5)
WBC: 6.7 10*3/uL (ref 4.0–10.5)

## 2014-03-22 LAB — URINE MICROSCOPIC-ADD ON

## 2014-03-22 NOTE — Progress Notes (Signed)
Pre-visit discussion using our clinic review tool. No additional management support is needed unless otherwise documented below in the visit note.  

## 2014-03-22 NOTE — Assessment & Plan Note (Signed)
Pt to be sent to Er for further evaluation  F/u pcp next week

## 2014-03-22 NOTE — ED Provider Notes (Signed)
CSN: [id number]     Arrival date & time 03/22/14  1233 History   First MD Initiated Contact with Patient 03/22/14 1258     Chief Complaint  Patient presents with  . Hypertension     (Consider location/radiation/quality/duration/timing/severity/associated sxs/prior Treatment) HPI 63 year old female presents from her primary care physician's office to rule out abdominal aortic aneurysm. Patient states she went to her PCP because she's been having more difficult to control blood pressure over the past week. Normally her blood pressure runs just under 387 systolic on Hyzaar. She's not been missing any doses but this past week her blood pressures been more in the 564P systolic. She's not had any chest pain or headaches. She noted palpitations once earlier when she knows her heart rate in the 120s on a blood pressure machine but more recently it has been normal. She worked out about 4 days ago and since has been having intermittent right back pain it feels like a muscle spasm. She went to her primary care physician today and they felt her aorta pulsating and sent her here to rule out AAA. She denies abdominal pain. At this moment she's not have any back pain. She's been taking ibuprofen with good relief of her back pain. Denies any weakness or numbness to her legs.  Past Medical History  Diagnosis Date  . HYPERLIPIDEMIA   . ALLERGIC RHINITIS   . HEADACHE, CHRONIC   . Primary hyperparathyroidism   . Hypercalcemia   . GERD   . Hx of adenomatous colonic polyps   . Hematuria, microscopic   . 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 Hypertension    Past Surgical History  Procedure Laterality Date  . Tubal ligation    . Colonoscopy w/ polypectomy  2009, 08/18/11    2009:adenomas, largest 4 cm, rectal carcinoid, hemorrhoids   Family History  Problem Relation Age of Onset  . Heart disease Mother 62    PCI  . Cancer Other     Lung  . Heart disease Other    Coronary Atery Disease  . Colon cancer Neg Hx   . Colon polyps Neg Hx    History  Substance Use Topics  . Smoking status: Former Smoker    Quit date: 03/03/1991  . Smokeless tobacco: Not on file  . Alcohol Use: No   OB History   Grav Para Term Preterm Abortions TAB SAB Ect Mult Living                 Review of Systems  Constitutional: Negative for fever.  Respiratory: Negative for shortness of breath.   Cardiovascular: Negative for chest pain, palpitations and leg swelling.  Gastrointestinal: Negative for vomiting, abdominal pain and abdominal distention.  Musculoskeletal: Positive for back pain.  Neurological: Negative for weakness and numbness.  All other systems reviewed and are negative.      Allergies  Review of patient's allergies indicates no known allergies.  Home Medications   Current Outpatient Rx  Name  Route  Sig  Dispense  Refill  . losartan-hydrochlorothiazide (HYZAAR) 50-12.5 MG per tablet   Oral   Take 1 tablet by mouth 2 (two) times daily.         . pantoprazole (PROTONIX) 40 MG tablet   Oral   Take 40 mg by mouth daily.         . simvastatin (ZOCOR) 20 MG tablet   Oral   Take 20 mg by mouth daily  at 6 PM.         . clotrimazole-betamethasone (LOTRISONE) cream   Topical   Apply 1 application topically as needed. Tinea pedis         . ibuprofen (ADVIL,MOTRIN) 200 MG tablet   Oral   Take 200 mg by mouth every 6 (six) hours as needed. Headache         . potassium chloride SA (K-DUR,KLOR-CON) 20 MEQ tablet   Oral   Take 1 tablet (20 mEq total) by mouth every other day.   30 tablet   5    BP 112/57  Pulse 63  Temp(Src) 97.7 F (36.5 C) (Oral)  Resp 19  SpO2 100% Physical Exam  Nursing note and vitals reviewed. Constitutional: She is oriented to person, place, and time. She appears well-developed and well-nourished. No distress.  HENT:  Head: Normocephalic and atraumatic.  Right Ear: External ear normal.  Left Ear:  External ear normal.  Nose: Nose normal.  Eyes: Right eye exhibits no discharge. Left eye exhibits no discharge.  Cardiovascular: Normal rate, regular rhythm and normal heart sounds.   Pulmonary/Chest: Effort normal and breath sounds normal.  Abdominal: Soft. There is no tenderness. There is no CVA tenderness.  Palpable abdominal pulse over aorta. No tenderness or swelling  Musculoskeletal:       Back:  Neurological: She is alert and oriented to person, place, and time.  Skin: Skin is warm and dry.    ED Course  Procedures (including critical care time) Labs Review Labs Reviewed  CBC - Abnormal; Notable for the following:    Hemoglobin 11.0 (*)    HCT 33.2 (*)    All other components within normal limits  COMPREHENSIVE METABOLIC PANEL - Abnormal; Notable for the following:    Sodium 134 (*)    Chloride 95 (*)    Glucose, Bld 106 (*)    BUN 26 (*)    Calcium 11.2 (*)    GFR calc non Af Amer 57 (*)    GFR calc Af Amer 66 (*)    All other components within normal limits  URINALYSIS, ROUTINE W REFLEX MICROSCOPIC - Abnormal; Notable for the following:    Hgb urine dipstick TRACE (*)    All other components within normal limits  URINE MICROSCOPIC-ADD ON   Imaging Review [place] Retroperitoneal Comp  03/22/2014   CLINICAL DATA:  Pulsatile abdominal mass.  EXAM: RETROPERITONEAL ULTRASOUND COMPLETE  TECHNIQUE: Ultrasound examination of the abdominal aorta was performed to evaluate for abdominal aortic aneurysm. The common iliac arteries, IVC, and kidneys were also evaluated.  COMPARISON:  None.  FINDINGS: Abdominal Aorta  No aneurysm.  Maximum AP  Diameter:  1.8 cm  Maximum TRV  Diameter: 1.7 cm  Right Common Iliac Artery  No aneurysm identified.  Measures 1.0 cm.  Left Common Iliac Artery  No aneurysm identified.  Measures 0.9 cm.  IVC  Limited evaluation.  Right Kidney  Length: 10.0 cm Echogenicity within normal limits. No mass or hydronephrosis visualized.  Left Kidney  Length: 10.0 cm.  Echogenicity within normal limits. No mass or hydronephrosis visualized. Echogenic area with shadowing in the lower pole could represent a stone. This area roughly measures 0.7 cm.  IMPRESSION: Negative for an aortic aneurysm.  Normal appearance of both kidneys.  Question left kidney stone.   Electronically Signed   By: Markus Daft M.D.   On: 03/22/2014 15:27     EKG Interpretation None      MDM   Final  diagnoses:  Hypertension    Patient is completely asymptomatic at this time. No signs of end organ damage from HTN. BP is normal here. I feel her intermittent back pain is likely MSK related from her recent workout. She has a normal U/S with no AAA. Possible left kidney stone which could explain her symptoms. Will recommend good fluid intake and oral pain control as needed. However it does not appear obstructing, I feel she can be discharged to f/u with her PCP.    Ephraim Hamburger, MD 03/22/14 [phone]

## 2014-03-22 NOTE — ED Notes (Addendum)
Pulsating palpable in upper abd. regular, matches carotid pulse. Seen at PCP this am because per pt BP has been varying over this week, normally 105-115 sys. but up to 120-140, sys this week. HR elevated as well, up to 120. Sts if her pressure is over 120, she feels dizzy. Denies n/v, abd pain, diaphoresis. BP checked in both arms, no change. Reports back pain intermittently but sts it felt more like a muscle pain, esp after exercising.

## 2014-03-22 NOTE — Patient Instructions (Signed)
You are being sent to the ER to rule out the possibility of an aneurysm. F/u with your PCP next week

## 2014-03-22 NOTE — Progress Notes (Signed)
   Subjective:    Patient ID: Sarah Santos, female    DOB: [birth year], 63 y.o.   MRN: [id number]  Hypertension  Pt here c/o mid back pain and fluctuating bp over last week or so.  No chest pain, nv. No known injury.  She gets some relief with IB.   bp normally , <120/80 ---- its been running 140-150 / high 80-90 Pt admits to being away week before last and her diet was not great but has been paying attention to her salt content since.      Review of Systems As above    Objective:   Physical Exam  Constitutional: She is oriented to person, place, and time. She appears well-developed and well-nourished. No distress.  HENT:  Head: Normocephalic and atraumatic.  Eyes: Conjunctivae and EOM are normal. Pupils are equal, round, and reactive to light.  Neck: Normal range of motion. Neck supple. No thyromegaly present.  Cardiovascular: Normal rate, regular rhythm, normal heart sounds and intact distal pulses.   No murmur heard. Pulmonary/Chest: Effort normal and breath sounds normal. No respiratory distress.  Abdominal: Soft. She exhibits pulsatile midline mass. She exhibits no distension. There is no tenderness. There is no CVA tenderness. No hernia.  Musculoskeletal: She exhibits no edema.  Lymphadenopathy:    She has no cervical adenopathy.  Neurological: She is alert and oriented to person, place, and time.  Skin: Skin is warm and dry.  Psychiatric: She has a normal mood and affect. Her behavior is normal.          Assessment & Plan:

## 2014-03-22 NOTE — ED Notes (Signed)
Pt states she was sent by PCP for [place] for possible abdominal aneurysm. Pt states her blood pressure has been fluctuating this week as well. Denies pain, dizziness or lightheadedness.

## 2014-03-31 ENCOUNTER — Ambulatory Visit: Payer: 59 | Admitting: Internal Medicine

## 2014-06-04 ENCOUNTER — Encounter: Payer: Self-pay | Admitting: Internal Medicine

## 2014-06-04 ENCOUNTER — Telehealth: Payer: Self-pay | Admitting: Internal Medicine

## 2014-06-04 ENCOUNTER — Ambulatory Visit (INDEPENDENT_AMBULATORY_CARE_PROVIDER_SITE_OTHER): Payer: 59 | Admitting: Internal Medicine

## 2014-06-04 VITALS — BP 112/68 | HR 67 | Temp 98.1°F | Ht 62.0 in | Wt 170.0 lb

## 2014-06-04 DIAGNOSIS — Z0000 Encounter for general adult medical examination without abnormal findings: Secondary | ICD-10-CM

## 2014-06-04 DIAGNOSIS — E785 Hyperlipidemia, unspecified: Secondary | ICD-10-CM

## 2014-06-04 DIAGNOSIS — I10 Essential (primary) hypertension: Secondary | ICD-10-CM

## 2014-06-04 MED ORDER — POTASSIUM CHLORIDE CRYS ER 20 MEQ PO TBCR: 20.0000 meq | EXTENDED_RELEASE_TABLET | ORAL | Status: DC

## 2014-06-04 MED ORDER — PANTOPRAZOLE SODIUM 40 MG PO TBEC: 40.0000 mg | DELAYED_RELEASE_TABLET | Freq: Every day | ORAL | Status: DC

## 2014-06-04 MED ORDER — SIMVASTATIN 20 MG PO TABS: 20.0000 mg | ORAL_TABLET | Freq: Every day | ORAL | Status: DC

## 2014-06-04 MED ORDER — LOSARTAN POTASSIUM-HCTZ 50-12.5 MG PO TABS: 1.0000 | ORAL_TABLET | Freq: Two times a day (BID) | ORAL | Status: DC

## 2014-06-04 NOTE — Assessment & Plan Note (Signed)
BP Readings from Last 3 Encounters:  06/04/14 112/68  03/22/14 113/63  03/22/14 140/76   Changed to generic ARB-HCTZ 06/2012 -  Increased to BID 10/2012 at pt preference for same (as opposed to 100/25 qd) - Recent variability in home BP reviewed - mild symptomatic hypotension events Patient will monitor home blood pressures and notify us if systolic greater than 673 or under 100

## 2014-06-04 NOTE — Patient Instructions (Signed)
It was good to see you today.  We have reviewed your prior records including labs and tests today  Health Maintenance reviewed - all recommended immunizations and age-appropriate screenings are up-to-date.  Test(s) ordered today. Return when you're fasting later this week. Your results will be released to [place] (or called to you) after review, usually within 72hours after test completion. If any changes need to be made, you will be notified at that same time.  Medications reviewed and updated, no changes recommended at this time. Refill on medication(s) as discussed today.  Please schedule followup in 12 months for annual exam and labs, call sooner if problems.

## 2014-06-04 NOTE — Telephone Encounter (Signed)
Relevant patient education assigned to patient using Emmi. ° °

## 2014-06-04 NOTE — Progress Notes (Signed)
Pre visit review using our clinic review tool, if applicable. No additional management support is needed unless otherwise documented below in the visit note. 

## 2014-06-04 NOTE — Assessment & Plan Note (Signed)
Self stopped statin 12/2011 due to weight loss with diet changes (daniel fast) Increase in total and LDL  01/2012 reviewed -  Resumed simva 20mg  qod 02/2012 Check lipids annually and titrate as needed

## 2014-06-04 NOTE — Progress Notes (Signed)
Subjective:    Patient ID: Sarah Santos, female    DOB: [birth year], 63 y.o.   MRN: [id number]  HPI  patient is here today for annual physical. Patient feels well and has no complaints.  Also reviewed chronic medical issues and interval medical events  Past Medical History  Diagnosis Date  . HYPERLIPIDEMIA   . ALLERGIC RHINITIS   . HEADACHE, CHRONIC   . Primary hyperparathyroidism   . Hypercalcemia   . GERD   . Hx of adenomatous colonic polyps   . Hematuria, microscopic   . 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 Hypertension    Family History  Problem Relation Age of Onset  . Heart disease Mother 33    PCI  . Cancer Other     Lung  . Heart disease Other     Coronary Atery Disease  . Colon cancer Neg Hx   . Colon polyps Neg Hx    History  Substance Use Topics  . Smoking status: Former Smoker    Quit date: 03/03/1991  . Smokeless tobacco: Not on file  . Alcohol Use: No    Review of Systems  Constitutional: Negative for fever, fatigue and unexpected weight change.  Eyes: Negative for discharge and visual disturbance.  Respiratory: Negative for cough, shortness of breath and wheezing.   Cardiovascular: Negative for chest pain, palpitations and leg swelling.  Gastrointestinal: Negative for nausea, abdominal pain and diarrhea.  Neurological: Negative for dizziness, weakness, light-headedness and headaches.       Occ quick tingle hands/feet "like an itch"  Psychiatric/Behavioral: Negative for dysphoric mood. The patient is not nervous/anxious.   All other systems reviewed and are negative.      Objective:   Physical Exam  BP 112/68  Pulse 67  Temp(Src) 98.1 F (36.7 C) (Oral)  Ht 5\' 2"  (1.575 m)  Wt 170 lb (77.111 kg)  BMI 31.09 kg/m2  SpO2 95% Wt Readings from Last 3 Encounters:  06/04/14 170 lb (77.111 kg)  03/22/14 169 lb 1.9 oz (76.712 kg)  12/16/13 169 lb (76.658 kg)   Constitutional: She appears  well-developed and well-nourished. No distress.  HENT: Head: Normocephalic and atraumatic. Ears: B TMs ok, no erythema or effusion; Nose: Nose normal. Mouth/Throat: Oropharynx is clear and moist. No oropharyngeal exudate.  Eyes: Conjunctivae and EOM are normal. Pupils are equal, round, and reactive to light. No scleral icterus.  Neck: Normal range of motion. Neck supple. No JVD present. No thyromegaly present.  Cardiovascular: Normal rate, regular rhythm and normal heart sounds.  No murmur heard. No BLE edema. Pulmonary/Chest: Effort normal and breath sounds normal. No respiratory distress. She has no wheezes.  Abdominal: Soft. Bowel sounds are normal. She exhibits no distension. There is no tenderness. no masses GU: defer to gyn - due for appointment Musculoskeletal: Normal range of motion, no joint effusions. No gross deformities Neurological: She is alert and oriented to person, place, and time. No cranial nerve deficit. Coordination, balance, strength, speech and gait are normal.  Skin: Skin is warm and dry. No rash noted. No erythema.  Psychiatric: She has a normal mood and affect. Her behavior is normal. Judgment and thought content normal.    Lab Results  Component Value Date   WBC 6.7 03/22/2014   HGB 11.0* 03/22/2014   HCT 33.2* 03/22/2014   PLT 254 03/22/2014   GLUCOSE 106* 03/22/2014   CHOL 187 03/11/2013   TRIG 47.0 03/11/2013   HDL  74.50 03/11/2013   LDLDIRECT 156.9 02/01/2012   LDLCALC 103* 03/11/2013   ALT 15 03/22/2014   AST 18 03/22/2014   NA 134* 03/22/2014   K 3.9 03/22/2014   CL 95* 03/22/2014   CREATININE 1.03 03/22/2014   BUN 26* 03/22/2014   CO2 26 03/22/2014   TSH 0.70 12/16/2013   HGBA1C 5.9 11/23/2009    [place] Retroperitoneal Comp  03/22/2014   CLINICAL DATA:  Pulsatile abdominal mass.  EXAM: RETROPERITONEAL ULTRASOUND COMPLETE  TECHNIQUE: Ultrasound examination of the abdominal aorta was performed to evaluate for abdominal aortic aneurysm. The common iliac arteries, IVC,  and kidneys were also evaluated.  COMPARISON:  None.  FINDINGS: Abdominal Aorta  No aneurysm.  Maximum AP  Diameter:  1.8 cm  Maximum TRV  Diameter: 1.7 cm  Right Common Iliac Artery  No aneurysm identified.  Measures 1.0 cm.  Left Common Iliac Artery  No aneurysm identified.  Measures 0.9 cm.  IVC  Limited evaluation.  Right Kidney  Length: 10.0 cm Echogenicity within normal limits. No mass or hydronephrosis visualized.  Left Kidney  Length: 10.0 cm. Echogenicity within normal limits. No mass or hydronephrosis visualized. Echogenic area with shadowing in the lower pole could represent a stone. This area roughly measures 0.7 cm.  IMPRESSION: Negative for an aortic aneurysm.  Normal appearance of both kidneys.  Question left kidney stone.   Electronically Signed   By: Markus Daft M.D.   On: 03/22/2014 15:27       Assessment & Plan:   CPX/v70.0 - Patient has been counseled on age-appropriate routine health concerns for screening and prevention. These are reviewed and up-to-date. Immunizations are up-to-date or declined. Labs ordered and reviewed.  Problem List Items Addressed This Visit   HYPERLIPIDEMIA     Self stopped statin 12/2011 due to weight loss with diet changes (daniel fast) Increase in total and LDL  01/2012 reviewed -  Resumed simva 20mg  qod 02/2012 Check lipids annually and titrate as needed    Relevant Medications      simvastatin (ZOCOR) tablet      losartan-hydrochlorothiazide (HYZAAR) 50-12.5 MG per tablet   Hypertension      BP Readings from Last 3 Encounters:  06/04/14 112/68  03/22/14 113/63  03/22/14 140/76   Changed to generic ARB-HCTZ 06/2012 -  Increased to BID 10/2012 at pt preference for same (as opposed to 100/25 qd) - Recent variability in home BP reviewed - mild symptomatic hypotension events Patient will monitor home blood pressures and notify us if systolic greater than 284 or under 100    Relevant Medications      simvastatin (ZOCOR) tablet       losartan-hydrochlorothiazide (HYZAAR) 50-12.5 MG per tablet    Other Visit Diagnoses   Routine general medical examination at a health care facility    -  Primary    Relevant Orders       Basic metabolic panel       CBC with Differential       Hepatic function panel       Lipid panel       TSH       Urinalysis, Routine w reflex microscopic

## 2014-06-06 ENCOUNTER — Other Ambulatory Visit (INDEPENDENT_AMBULATORY_CARE_PROVIDER_SITE_OTHER): Payer: 59

## 2014-06-06 DIAGNOSIS — Z0000 Encounter for general adult medical examination without abnormal findings: Secondary | ICD-10-CM

## 2014-06-06 LAB — URINALYSIS, ROUTINE W REFLEX MICROSCOPIC
Bilirubin Urine: NEGATIVE
HGB URINE DIPSTICK: NEGATIVE
Ketones, ur: NEGATIVE
LEUKOCYTES UA: NEGATIVE
Nitrite: NEGATIVE
SPECIFIC GRAVITY, URINE: 1.015 (ref 1.000–1.030)
Total Protein, Urine: NEGATIVE
URINE GLUCOSE: NEGATIVE
UROBILINOGEN UA: 0.2 (ref 0.0–1.0)
pH: 6 (ref 5.0–8.0)

## 2014-06-06 LAB — CBC WITH DIFFERENTIAL/PLATELET
BASOS PCT: 0.7 % (ref 0.0–3.0)
Basophils Absolute: 0 10*3/uL (ref 0.0–0.1)
EOS ABS: 0.1 10*3/uL (ref 0.0–0.7)
Eosinophils Relative: 2.5 % (ref 0.0–5.0)
HCT: 33.7 % — ABNORMAL LOW (ref 36.0–46.0)
HEMOGLOBIN: 11 g/dL — AB (ref 12.0–15.0)
Lymphocytes Relative: 45.4 % (ref 12.0–46.0)
Lymphs Abs: 2.7 10*3/uL (ref 0.7–4.0)
MCHC: 32.6 g/dL (ref 30.0–36.0)
MCV: 81.5 fl (ref 78.0–100.0)
MONOS PCT: 8.1 % (ref 3.0–12.0)
Monocytes Absolute: 0.5 10*3/uL (ref 0.1–1.0)
NEUTROS PCT: 43.3 % (ref 43.0–77.0)
Neutro Abs: 2.6 10*3/uL (ref 1.4–7.7)
Platelets: 235 10*3/uL (ref 150.0–400.0)
RBC: 4.14 Mil/uL (ref 3.87–5.11)
RDW: 13.5 % (ref 11.5–15.5)
WBC: 5.9 10*3/uL (ref 4.0–10.5)

## 2014-06-06 LAB — LIPID PANEL
Cholesterol: 208 mg/dL — ABNORMAL HIGH (ref 0–200)
HDL: 73.5 mg/dL (ref >39.00)
LDL Cholesterol: 124 mg/dL — ABNORMAL HIGH (ref 0–99)
NonHDL: 134.5
Total CHOL/HDL Ratio: 3
Triglycerides: 55 mg/dL (ref 0.0–149.0)
VLDL: 11 mg/dL (ref 0.0–40.0)

## 2014-06-06 LAB — HEPATIC FUNCTION PANEL
ALBUMIN: 3.8 g/dL (ref 3.5–5.2)
ALK PHOS: 74 U/L (ref 39–117)
ALT: 16 U/L (ref 0–35)
AST: 17 U/L (ref 0–37)
BILIRUBIN DIRECT: 0.1 mg/dL (ref 0.0–0.3)
TOTAL PROTEIN: 7.5 g/dL (ref 6.0–8.3)
Total Bilirubin: 0.4 mg/dL (ref 0.2–1.2)

## 2014-06-06 LAB — BASIC METABOLIC PANEL
BUN: 20 mg/dL (ref 6–23)
CALCIUM: 10.5 mg/dL (ref 8.4–10.5)
CO2: 27 meq/L (ref 19–32)
Chloride: 103 mEq/L (ref 96–112)
Creatinine, Ser: 0.9 mg/dL (ref 0.4–1.2)
GFR: 82.56 mL/min (ref >60.00)
Glucose, Bld: 93 mg/dL (ref 70–99)
Potassium: 3.6 mEq/L (ref 3.5–5.1)
SODIUM: 138 meq/L (ref 135–145)

## 2014-06-06 LAB — TSH: TSH: 0.94 u[IU]/mL (ref 0.35–4.50)

## 2014-08-14 ENCOUNTER — Ambulatory Visit (INDEPENDENT_AMBULATORY_CARE_PROVIDER_SITE_OTHER): Payer: 59 | Admitting: Internal Medicine

## 2014-08-14 ENCOUNTER — Encounter: Payer: Self-pay | Admitting: Internal Medicine

## 2014-08-14 VITALS — BP 118/66 | HR 65 | Temp 98.2°F | Wt 172.1 lb

## 2014-08-14 DIAGNOSIS — M25569 Pain in unspecified knee: Secondary | ICD-10-CM

## 2014-08-14 DIAGNOSIS — K219 Gastro-esophageal reflux disease without esophagitis: Secondary | ICD-10-CM

## 2014-08-14 DIAGNOSIS — M25562 Pain in left knee: Secondary | ICD-10-CM

## 2014-08-14 MED ORDER — MELOXICAM 7.5 MG PO TABS: 7.5000 mg | ORAL_TABLET | Freq: Every day | ORAL | Status: DC

## 2014-08-14 NOTE — Patient Instructions (Signed)
Use an anti-inflammatory cream such as Aspercreme or Zostrix cream twice a day to the affected area as needed. In lieu of this warm moist compresses or  hot water bottle can be used. Do not apply ice .If your symptoms persist or progress; I would recommend referral to Dr Charlann Boxer, [phone].

## 2014-08-14 NOTE — Progress Notes (Signed)
Pre visit review using our clinic review tool, if applicable. No additional management support is needed unless otherwise documented below in the visit note. 

## 2014-08-14 NOTE — Progress Notes (Signed)
   Subjective:    Patient ID: Sarah Santos, female    DOB: 04/16/1951, 63 y.o.   MRN: [id number]  HPI  While walking 8/15 she experienced pain in the left knee below the  Patella w/o associated trauma or injury. She describes the knee as "locking up". She has constant pain but is worse with certain positions particularly extending the lower extremity. Walking also aggravates symptoms  She's been taking ibuprofen with some benefit. Problematic is PMH GERD for which she takes PPI.  She is very active , involved in spin classes & yoga in addition to her walking. Review of Systems  Associated redness, swelling,  skin color or temperature change denied.  She has had some numbness in her RU extremity in the context of a past history of carpal tunnel release.  She has no associated weakness in the extremities.       Objective:   Physical Exam  She has full range of motion of the cervical spine  Range of motion in the upper extremities is excellent.  She has pain at the left mid to lateral knee with range of motion. Present is crepitus bilaterally but worse on the left. There is no definite effusion.  There are some lipomatous changes superior to the patella bilaterally.  She has trace edema at the ankles.  Strength, tone excellent.  Gait is normal.      Assessment & Plan:  #1 knee pain, rule out partial meniscal tear  #2 history of GERD with increased risk with the nonsteroidal she's taking See orders & recommendations documented in AVS

## 2014-11-19 ENCOUNTER — Encounter: Payer: Self-pay | Admitting: Internal Medicine

## 2014-11-19 ENCOUNTER — Ambulatory Visit (INDEPENDENT_AMBULATORY_CARE_PROVIDER_SITE_OTHER)
Admission: RE | Admit: 2014-11-19 | Discharge: 2014-11-19 | Disposition: A | Discharge status: Home / Self Care | Payer: 59 | Source: Ambulatory Visit | Attending: Internal Medicine | Admitting: Internal Medicine

## 2014-11-19 ENCOUNTER — Ambulatory Visit (INDEPENDENT_AMBULATORY_CARE_PROVIDER_SITE_OTHER): Payer: 59 | Admitting: Internal Medicine

## 2014-11-19 ENCOUNTER — Other Ambulatory Visit (INDEPENDENT_AMBULATORY_CARE_PROVIDER_SITE_OTHER): Payer: 59

## 2014-11-19 VITALS — BP 130/64 | HR 106 | Temp 98.6°F | Resp 16 | Ht 62.0 in | Wt 167.0 lb

## 2014-11-19 DIAGNOSIS — R000 Tachycardia, unspecified: Secondary | ICD-10-CM | POA: Diagnosis not present

## 2014-11-19 DIAGNOSIS — I10 Essential (primary) hypertension: Secondary | ICD-10-CM | POA: Diagnosis not present

## 2014-11-19 DIAGNOSIS — J069 Acute upper respiratory infection, unspecified: Secondary | ICD-10-CM

## 2014-11-19 DIAGNOSIS — R05 Cough: Secondary | ICD-10-CM

## 2014-11-19 DIAGNOSIS — E210 Primary hyperparathyroidism: Secondary | ICD-10-CM

## 2014-11-19 DIAGNOSIS — D649 Anemia, unspecified: Secondary | ICD-10-CM

## 2014-11-19 DIAGNOSIS — R059 Cough, unspecified: Secondary | ICD-10-CM | POA: Insufficient documentation

## 2014-11-19 DIAGNOSIS — B9789 Other viral agents as the cause of diseases classified elsewhere: Secondary | ICD-10-CM

## 2014-11-19 LAB — IBC PANEL
Iron: 39 ug/dL — ABNORMAL LOW (ref 42–145)
Saturation Ratios: 9 % — ABNORMAL LOW (ref 20.0–50.0)
TRANSFERRIN: 309.3 mg/dL (ref 212.0–360.0)

## 2014-11-19 LAB — COMPREHENSIVE METABOLIC PANEL
ALT: 16 U/L (ref 0–35)
AST: 20 U/L (ref 0–37)
Albumin: 4.5 g/dL (ref 3.5–5.2)
Alkaline Phosphatase: 90 U/L (ref 39–117)
BILIRUBIN TOTAL: 0.5 mg/dL (ref 0.2–1.2)
BUN: 21 mg/dL (ref 6–23)
CO2: 28 mEq/L (ref 19–32)
Calcium: 10.6 mg/dL — ABNORMAL HIGH (ref 8.4–10.5)
Chloride: 97 mEq/L (ref 96–112)
Creatinine, Ser: 0.9 mg/dL (ref 0.4–1.2)
GFR: 77.4 mL/min (ref >60.00)
GLUCOSE: 86 mg/dL (ref 70–99)
Potassium: 3.4 mEq/L — ABNORMAL LOW (ref 3.5–5.1)
Sodium: 134 mEq/L — ABNORMAL LOW (ref 135–145)
Total Protein: 8.5 g/dL — ABNORMAL HIGH (ref 6.0–8.3)

## 2014-11-19 LAB — CBC WITH DIFFERENTIAL/PLATELET
BASOS ABS: 0 10*3/uL (ref 0.0–0.1)
Basophils Relative: 0.5 % (ref 0.0–3.0)
Eosinophils Absolute: 0.1 10*3/uL (ref 0.0–0.7)
Eosinophils Relative: 1.7 % (ref 0.0–5.0)
HEMATOCRIT: 36.2 % (ref 36.0–46.0)
Hemoglobin: 11.6 g/dL — ABNORMAL LOW (ref 12.0–15.0)
Lymphocytes Relative: 25.6 % (ref 12.0–46.0)
Lymphs Abs: 1.7 10*3/uL (ref 0.7–4.0)
MCHC: 32 g/dL (ref 30.0–36.0)
MCV: 81 fl (ref 78.0–100.0)
MONOS PCT: 11.6 % (ref 3.0–12.0)
Monocytes Absolute: 0.8 10*3/uL (ref 0.1–1.0)
Neutro Abs: 4.1 10*3/uL (ref 1.4–7.7)
Neutrophils Relative %: 60.6 % (ref 43.0–77.0)
Platelets: 279 10*3/uL (ref 150.0–400.0)
RBC: 4.47 Mil/uL (ref 3.87–5.11)
RDW: 13.5 % (ref 11.5–15.5)
WBC: 6.7 10*3/uL (ref 4.0–10.5)

## 2014-11-19 LAB — FOLATE: Folate: 7.2 ng/mL (ref >5.9)

## 2014-11-19 LAB — T4, FREE: Free T4: 1.31 ng/dL (ref 0.60–1.60)

## 2014-11-19 LAB — T3, FREE: T3, Free: 3.3 pg/mL (ref 2.3–4.2)

## 2014-11-19 LAB — T4: T4, Total: 9 ug/dL (ref 4.5–12.0)

## 2014-11-19 LAB — FERRITIN: Ferritin: 70.2 ng/mL (ref 10.0–291.0)

## 2014-11-19 LAB — VITAMIN B12: VITAMIN B 12: 654 pg/mL (ref 211–911)

## 2014-11-19 LAB — TSH: TSH: 0.98 u[IU]/mL (ref 0.35–4.50)

## 2014-11-19 MED ORDER — HYDROCODONE-HOMATROPINE 5-1.5 MG/5ML PO SYRP: 5.0000 mL | ORAL_SOLUTION | Freq: Three times a day (TID) | ORAL | Status: DC | PRN

## 2014-11-19 NOTE — Progress Notes (Signed)
Subjective:    Patient ID: Sarah Santos, female    DOB: 06-Aug-1951, 63 y.o.   MRN: [id number]  Cough This is a new problem. The current episode started in the past 7 days. The problem has been unchanged. The problem occurs every few hours. The cough is non-productive. Associated symptoms include chills, postnasal drip, rhinorrhea and shortness of breath. Pertinent negatives include no chest pain, ear congestion, ear pain, fever, headaches, heartburn, hemoptysis, myalgias, nasal congestion, rash, sore throat, sweats, weight loss or wheezing. Nothing aggravates the symptoms. She has tried nothing for the symptoms. The treatment provided no relief. There is no history of asthma, bronchiectasis, bronchitis, COPD, emphysema, environmental allergies or pneumonia.      Review of Systems  Constitutional: Positive for chills. Negative for fever, weight loss, diaphoresis, activity change, appetite change, fatigue and unexpected weight change.  HENT: Positive for postnasal drip and rhinorrhea. Negative for ear pain, sinus pressure, sore throat, tinnitus and trouble swallowing.   Eyes: Negative.   Respiratory: Positive for cough and shortness of breath. Negative for apnea, hemoptysis, choking, chest tightness, wheezing and stridor.   Cardiovascular: Positive for palpitations (she complains that her pulse has been high (110-120) for the last 10 days). Negative for chest pain and leg swelling.  Gastrointestinal: Negative.  Negative for heartburn, nausea, abdominal pain, diarrhea and constipation.  Endocrine: Negative.   Genitourinary: Negative.   Musculoskeletal: Negative.  Negative for myalgias.  Skin: Negative.  Negative for rash.  Allergic/Immunologic: Negative.  Negative for environmental allergies.  Neurological: Negative.  Negative for dizziness, tremors, seizures, syncope, facial asymmetry, speech difficulty, weakness, light-headedness, numbness and headaches.  Hematological: Negative.   Negative for adenopathy. Does not bruise/bleed easily.  Psychiatric/Behavioral: Negative.        Objective:   Physical Exam  Constitutional: She is oriented to person, place, and time. She appears well-developed and well-nourished. No distress.  HENT:  Head: Normocephalic and atraumatic.  Mouth/Throat: Oropharynx is clear and moist. No oropharyngeal exudate.  Eyes: Conjunctivae are normal. Right eye exhibits no discharge. Left eye exhibits no discharge. No scleral icterus.  Neck: Normal range of motion. Neck supple. No JVD present. No tracheal deviation present. No thyromegaly present.  Cardiovascular: Normal rate, regular rhythm, normal heart sounds and intact distal pulses.  Exam reveals no gallop and no friction rub.   No murmur heard. Pulmonary/Chest: Effort normal and breath sounds normal. No stridor. No respiratory distress. She has no wheezes. She has no rales. She exhibits no tenderness.  Abdominal: Soft. Bowel sounds are normal. She exhibits no distension and no mass. There is no tenderness. There is no rebound and no guarding.  Musculoskeletal: Normal range of motion. She exhibits no edema or tenderness.  Lymphadenopathy:    She has no cervical adenopathy.  Neurological: She is oriented to person, place, and time.  Skin: Skin is warm and dry. No rash noted. She is not diaphoretic. No erythema. No pallor.  Psychiatric: She has a normal mood and affect. Her behavior is normal. Judgment and thought content normal.  Vitals reviewed.     Lab Results  Component Value Date   WBC 5.9 06/06/2014   HGB 11.0* 06/06/2014   HCT 33.7* 06/06/2014   PLT 235.0 06/06/2014   GLUCOSE 93 06/06/2014   CHOL 208* 06/06/2014   TRIG 55.0 06/06/2014   HDL 73.50 06/06/2014   LDLDIRECT 156.9 02/01/2012   LDLCALC 124* 06/06/2014   ALT 16 06/06/2014   AST 17 06/06/2014   NA 138 06/06/2014  K 3.6 06/06/2014   CL 103 06/06/2014   CREATININE 0.9 06/06/2014   BUN 20 06/06/2014   CO2 27  06/06/2014   TSH 0.94 06/06/2014   HGBA1C 5.9 11/23/2009      Assessment & Plan:

## 2014-11-19 NOTE — Patient Instructions (Signed)
Cough, Adult  A cough is a reflex that helps clear your throat and airways. It can help heal the body or may be a reaction to an irritated airway. A cough may only last 2 or 3 weeks (acute) or may last more than 8 weeks (chronic).  CAUSES Acute cough:  Viral or bacterial infections. Chronic cough:  Infections.  Allergies.  Asthma.  Post-nasal drip.  Smoking.  Heartburn or acid reflux.  Some medicines.  Chronic lung problems (COPD).  Cancer. SYMPTOMS   Cough.  Fever.  Chest pain.  Increased breathing rate.  High-pitched whistling sound when breathing (wheezing).  Colored mucus that you cough up (sputum). TREATMENT   A bacterial cough may be treated with antibiotic medicine.  A viral cough must run its course and will not respond to antibiotics.  Your caregiver may recommend other treatments if you have a chronic cough. HOME CARE INSTRUCTIONS   Only take over-the-counter or prescription medicines for pain, discomfort, or fever as directed by your caregiver. Use cough suppressants only as directed by your caregiver.  Use a cold steam vaporizer or humidifier in your bedroom or home to help loosen secretions.  Sleep in a semi-upright position if your cough is worse at night.  Rest as needed.  Stop smoking if you smoke. SEEK IMMEDIATE MEDICAL CARE IF:   You have pus in your sputum.  Your cough starts to worsen.  You cannot control your cough with suppressants and are losing sleep.  You begin coughing up blood.  You have difficulty breathing.  You develop pain which is getting worse or is uncontrolled with medicine.  You have a fever. MAKE SURE YOU:   Understand these instructions.  Will watch your condition.  Will get help right away if you are not doing well or get worse. Document Released: 06/10/2011 Document Revised: 03/05/2012 Document Reviewed: 06/10/2011 [employer] Patient Information 2015 [employer]. This information is not intended  to replace advice given to you by your health care provider. Make sure you discuss any questions you have with your health care provider.  

## 2014-11-19 NOTE — Assessment & Plan Note (Addendum)
I will check her CXR and labs to look for secondary causes of this I think this is mostly related to anxiety

## 2014-11-19 NOTE — Assessment & Plan Note (Signed)
Her BP is well controlled 

## 2014-11-19 NOTE — Assessment & Plan Note (Signed)
Her CXR is normal.

## 2014-11-19 NOTE — Assessment & Plan Note (Signed)
I will recheck her PTH and Ca++ level and will address if needed

## 2014-11-19 NOTE — Assessment & Plan Note (Signed)
I will recheck her CBC and will look at her Vit levels as well

## 2014-11-19 NOTE — Assessment & Plan Note (Signed)
Her CXR is normal Will Rx hycodan for symptom relief

## 2014-11-19 NOTE — Progress Notes (Signed)
Pre visit review using our clinic review tool, if applicable. No additional management support is needed unless otherwise documented below in the visit note. 

## 2014-11-20 LAB — D-DIMER, QUANTITATIVE (NOT AT ARMC): D-Dimer, Quant: 0.27 ug/mL-FEU (ref 0.00–0.48)

## 2014-11-21 LAB — PTH, INTACT AND CALCIUM
CALCIUM: 10.6 mg/dL — AB (ref 8.4–10.5)
PTH: 137 pg/mL — AB (ref 14–64)

## 2014-11-23 ENCOUNTER — Encounter: Payer: Self-pay | Admitting: Internal Medicine

## 2014-11-26 ENCOUNTER — Telehealth: Payer: Self-pay | Admitting: Internal Medicine

## 2014-11-26 ENCOUNTER — Other Ambulatory Visit: Payer: Self-pay | Admitting: Internal Medicine

## 2014-11-26 DIAGNOSIS — R778 Other specified abnormalities of plasma proteins: Secondary | ICD-10-CM

## 2014-11-26 DIAGNOSIS — E210 Primary hyperparathyroidism: Secondary | ICD-10-CM

## 2014-11-26 HISTORY — DX: Other specified abnormalities of plasma proteins: R77.8

## 2014-11-26 NOTE — Telephone Encounter (Signed)
Notified. 

## 2014-11-26 NOTE — Telephone Encounter (Signed)
Pls put lab order in computer for pt for 12/4 she is returning for addl labs per mychart.

## 2014-11-26 NOTE — Telephone Encounter (Signed)
Repeat labs ordered She will need to see her PCP and ENDO soon for reevaluation

## 2014-11-26 NOTE — Telephone Encounter (Signed)
Does pt need to repeat any other labs besides CMET and PTH? Please advise?

## 2014-11-28 ENCOUNTER — Other Ambulatory Visit (INDEPENDENT_AMBULATORY_CARE_PROVIDER_SITE_OTHER): Payer: 59

## 2014-11-28 DIAGNOSIS — E210 Primary hyperparathyroidism: Secondary | ICD-10-CM

## 2014-11-28 DIAGNOSIS — R778 Other specified abnormalities of plasma proteins: Secondary | ICD-10-CM

## 2014-11-30 LAB — COMPREHENSIVE METABOLIC PANEL
ALK PHOS: 77 U/L (ref 39–117)
ALT: 17 U/L (ref 0–35)
AST: 21 U/L (ref 0–37)
Albumin: 4.1 g/dL (ref 3.5–5.2)
BILIRUBIN TOTAL: 0.5 mg/dL (ref 0.2–1.2)
BUN: 24 mg/dL — ABNORMAL HIGH (ref 6–23)
CO2: 24 mEq/L (ref 19–32)
CREATININE: 1.2 mg/dL (ref 0.4–1.2)
Calcium: 10.5 mg/dL (ref 8.4–10.5)
Chloride: 101 mEq/L (ref 96–112)
GFR: 57.83 mL/min — ABNORMAL LOW (ref >60.00)
GLUCOSE: 79 mg/dL (ref 70–99)
Potassium: 4.1 mEq/L (ref 3.5–5.1)
Sodium: 137 mEq/L (ref 135–145)
Total Protein: 7.6 g/dL (ref 6.0–8.3)

## 2014-12-02 LAB — SPEP & IFE WITH QIG
Albumin ELP: 55.6 % — ABNORMAL LOW (ref 55.8–66.1)
Alpha-1-Globulin: 4.9 % (ref 2.9–4.9)
Alpha-2-Globulin: 9.5 % (ref 7.1–11.8)
Beta 2: 5.7 % (ref 3.2–6.5)
Beta Globulin: 5.8 % (ref 4.7–7.2)
Gamma Globulin: 18.5 % (ref 11.1–18.8)
IGA: 203 mg/dL (ref 69–380)
IGG (IMMUNOGLOBIN G), SERUM: 1520 mg/dL (ref 690–1700)
IGM, SERUM: 80 mg/dL (ref 52–322)
Total Protein, Serum Electrophoresis: 7.5 g/dL (ref 6.0–8.3)

## 2014-12-06 ENCOUNTER — Encounter: Payer: Self-pay | Admitting: Internal Medicine

## 2015-02-23 ENCOUNTER — Other Ambulatory Visit: Payer: Self-pay

## 2015-02-23 MED ORDER — POTASSIUM CHLORIDE CRYS ER 20 MEQ PO TBCR: 20.0000 meq | EXTENDED_RELEASE_TABLET | ORAL | Status: DC

## 2015-05-26 ENCOUNTER — Encounter: Payer: Self-pay | Admitting: Internal Medicine

## 2015-05-26 ENCOUNTER — Other Ambulatory Visit (INDEPENDENT_AMBULATORY_CARE_PROVIDER_SITE_OTHER): Payer: 59

## 2015-05-26 ENCOUNTER — Ambulatory Visit (INDEPENDENT_AMBULATORY_CARE_PROVIDER_SITE_OTHER): Payer: 59 | Admitting: Internal Medicine

## 2015-05-26 VITALS — BP 138/72 | HR 98 | Wt 169.0 lb

## 2015-05-26 DIAGNOSIS — E210 Primary hyperparathyroidism: Secondary | ICD-10-CM

## 2015-05-26 DIAGNOSIS — R000 Tachycardia, unspecified: Secondary | ICD-10-CM

## 2015-05-26 DIAGNOSIS — E785 Hyperlipidemia, unspecified: Secondary | ICD-10-CM

## 2015-05-26 DIAGNOSIS — I10 Essential (primary) hypertension: Secondary | ICD-10-CM

## 2015-05-26 LAB — CBC WITH DIFFERENTIAL/PLATELET
Basophils Absolute: 0 10*3/uL (ref 0.0–0.1)
Basophils Relative: 0.3 % (ref 0.0–3.0)
EOS PCT: 0.7 % (ref 0.0–5.0)
Eosinophils Absolute: 0 10*3/uL (ref 0.0–0.7)
HCT: 35.2 % — ABNORMAL LOW (ref 36.0–46.0)
Hemoglobin: 11.5 g/dL — ABNORMAL LOW (ref 12.0–15.0)
LYMPHS PCT: 36.9 % (ref 12.0–46.0)
Lymphs Abs: 2.5 10*3/uL (ref 0.7–4.0)
MCHC: 32.5 g/dL (ref 30.0–36.0)
MCV: 80.4 fl (ref 78.0–100.0)
MONO ABS: 0.7 10*3/uL (ref 0.1–1.0)
MONOS PCT: 9.7 % (ref 3.0–12.0)
NEUTROS PCT: 52.4 % (ref 43.0–77.0)
Neutro Abs: 3.6 10*3/uL (ref 1.4–7.7)
PLATELETS: 273 10*3/uL (ref 150.0–400.0)
RBC: 4.38 Mil/uL (ref 3.87–5.11)
RDW: 13.3 % (ref 11.5–15.5)
WBC: 6.8 10*3/uL (ref 4.0–10.5)

## 2015-05-26 LAB — BASIC METABOLIC PANEL
BUN: 21 mg/dL (ref 6–23)
CALCIUM: 11 mg/dL — AB (ref 8.4–10.5)
CHLORIDE: 98 meq/L (ref 96–112)
CO2: 29 meq/L (ref 19–32)
Creatinine, Ser: 1.1 mg/dL (ref 0.40–1.20)
GFR: 64.45 mL/min (ref >60.00)
Glucose, Bld: 96 mg/dL (ref 70–99)
POTASSIUM: 3.5 meq/L (ref 3.5–5.1)
Sodium: 134 mEq/L — ABNORMAL LOW (ref 135–145)

## 2015-05-26 LAB — HEPATIC FUNCTION PANEL
ALBUMIN: 4.5 g/dL (ref 3.5–5.2)
ALK PHOS: 85 U/L (ref 39–117)
ALT: 14 U/L (ref 0–35)
AST: 17 U/L (ref 0–37)
BILIRUBIN TOTAL: 0.5 mg/dL (ref 0.2–1.2)
Bilirubin, Direct: 0.1 mg/dL (ref 0.0–0.3)
Total Protein: 8.4 g/dL — ABNORMAL HIGH (ref 6.0–8.3)

## 2015-05-26 LAB — MAGNESIUM: Magnesium: 2.4 mg/dL (ref 1.5–2.5)

## 2015-05-26 LAB — TSH: TSH: 0.81 u[IU]/mL (ref 0.35–4.50)

## 2015-05-26 LAB — T4, FREE: Free T4: 1.02 ng/dL (ref 0.60–1.60)

## 2015-05-26 MED ORDER — METOPROLOL SUCCINATE ER 25 MG PO TB24: 25.0000 mg | ORAL_TABLET | Freq: Every day | ORAL | Status: DC

## 2015-05-26 NOTE — Progress Notes (Signed)
Pre visit review using our clinic review tool, if applicable. No additional management support is needed unless otherwise documented below in the visit note. 

## 2015-05-26 NOTE — Assessment & Plan Note (Signed)
PTH, Ca

## 2015-05-26 NOTE — Assessment & Plan Note (Signed)
Losartan HCT 

## 2015-05-26 NOTE — Assessment & Plan Note (Signed)
On Simvastatin 

## 2015-05-26 NOTE — Progress Notes (Signed)
Subjective:  Patient ID: Sarah Santos, female    DOB: 1951/11/06  Age: 64 y.o. MRN: [id number]  CC: No chief complaint on file.   HPI [place] Trueitt-Johnson presents for a rapid HR 102-108 BPM, SBP 145; tired - started this wknd. Pt is doing aerobics, yoga, spin  Outpatient Prescriptions Prior to Visit  Medication Sig Dispense Refill  . losartan-hydrochlorothiazide (HYZAAR) 50-12.5 MG per tablet Take 1 tablet by mouth 2 (two) times daily. 180 tablet 3  . pantoprazole (PROTONIX) 40 MG tablet Take 1 tablet (40 mg total) by mouth daily. 90 tablet 3  . potassium chloride SA (K-DUR,KLOR-CON) 20 MEQ tablet Take 1 tablet (20 mEq total) by mouth every other day. 90 tablet 3  . simvastatin (ZOCOR) 20 MG tablet Take 1 tablet (20 mg total) by mouth daily at 6 PM. 90 tablet 3  . HYDROcodone-homatropine (HYCODAN) 5-1.5 MG/5ML syrup Take 5 mLs by mouth every 8 (eight) hours as needed for cough. (Patient not taking: Reported on 05/26/2015) 120 mL 0   No facility-administered medications prior to visit.    ROS Review of Systems  Constitutional: Negative for chills, activity change, appetite change, fatigue and unexpected weight change.  HENT: Negative for congestion, mouth sores and sinus pressure.   Eyes: Negative for visual disturbance.  Respiratory: Negative for cough and chest tightness.   Cardiovascular: Positive for palpitations. Negative for chest pain and leg swelling.  Gastrointestinal: Negative for nausea and abdominal pain.  Genitourinary: Negative for frequency, difficulty urinating and vaginal pain.  Musculoskeletal: Negative for back pain and gait problem.  Skin: Negative for pallor and rash.  Neurological: Negative for dizziness, tremors, weakness, numbness and headaches.  Psychiatric/Behavioral: Negative for confusion and sleep disturbance.    Objective:  BP 138/72 mmHg  Pulse 98  Wt 169 lb (76.658 kg)  SpO2 98%  BP Readings from Last 3 Encounters:  05/26/15  138/72  11/19/14 130/64  08/14/14 118/66    Wt Readings from Last 3 Encounters:  05/26/15 169 lb (76.658 kg)  11/19/14 167 lb (75.751 kg)  08/14/14 172 lb 2 oz (78.075 kg)    Physical Exam  Constitutional: She appears well-developed. No distress.  HENT:  Head: Normocephalic.  Right Ear: External ear normal.  Left Ear: External ear normal.  Nose: Nose normal.  Mouth/Throat: Oropharynx is clear and moist.  Eyes: Conjunctivae are normal. Pupils are equal, round, and reactive to light. Right eye exhibits no discharge. Left eye exhibits no discharge.  Neck: Normal range of motion. Neck supple. No JVD present. No tracheal deviation present. No thyromegaly present.  Cardiovascular: Normal rate, regular rhythm and normal heart sounds.   Initially tachycardic  Pulmonary/Chest: No stridor. No respiratory distress. She has no wheezes.  Abdominal: Soft. Bowel sounds are normal. She exhibits no distension and no mass. There is no tenderness. There is no rebound and no guarding.  Musculoskeletal: She exhibits no edema or tenderness.  Lymphadenopathy:    She has no cervical adenopathy.  Neurological: She displays normal reflexes. No cranial nerve deficit. She exhibits normal muscle tone. Coordination normal.  Skin: No rash noted. No erythema.  Psychiatric: She has a normal mood and affect. Her behavior is normal. Judgment and thought content normal.    Lab Results  Component Value Date   WBC 6.8 05/26/2015   HGB 11.5* 05/26/2015   HCT 35.2* 05/26/2015   PLT 273.0 05/26/2015   GLUCOSE 96 05/26/2015   CHOL 208* 06/06/2014   TRIG 55.0 06/06/2014   HDL 73.50 06/06/2014  LDLDIRECT 156.9 02/01/2012   LDLCALC 124* 06/06/2014   ALT 14 05/26/2015   AST 17 05/26/2015   NA 134* 05/26/2015   K 3.5 05/26/2015   CL 98 05/26/2015   CREATININE 1.10 05/26/2015   BUN 21 05/26/2015   CO2 29 05/26/2015   TSH 0.81 05/26/2015   HGBA1C 5.9 11/23/2009    Dg Chest 2 View  11/19/2014   CLINICAL  DATA:  Cough, congestion for 3 days, chest heaviness, former smoking history  EXAM: CHEST  2 VIEW  COMPARISON:  Portable chest x-ray of 11/03/2012  FINDINGS: No active infiltrate or effusion is seen. Mediastinal and hilar contours appear normal. The heart is within normal limits in size. Only mild degenerative change is present in the mid to lower thoracic spine.  IMPRESSION: No active cardiopulmonary disease.   Electronically Signed   By: Ivar Drape M.D.   On: 11/19/2014 09:28   EKG NSR HR 97  Assessment & Plan:   Diagnoses and all orders for this visit:  Primary hyperparathyroidism Orders: -     TSH; Future -     T4, free; Future -     Hepatic function panel; Future -     CBC with Differential/Platelet; Future -     Basic metabolic panel; Future -     Magnesium; Future -     PTH, Intact and Calcium; Future  Essential hypertension Orders: -     TSH; Future -     T4, free; Future -     Hepatic function panel; Future -     CBC with Differential/Platelet; Future -     Basic metabolic panel; Future -     Magnesium; Future -     PTH, Intact and Calcium; Future  Dyslipidemia Orders: -     TSH; Future -     T4, free; Future -     Hepatic function panel; Future -     CBC with Differential/Platelet; Future -     Basic metabolic panel; Future -     Magnesium; Future -     PTH, Intact and Calcium; Future  Tachycardia Orders: -     EKG 12-Lead -     TSH; Future -     T4, free; Future -     Hepatic function panel; Future -     CBC with Differential/Platelet; Future -     Basic metabolic panel; Future -     Magnesium; Future -     PTH, Intact and Calcium; Future  Other orders -     Discontinue: metoprolol succinate (TOPROL-XL) 25 MG 24 hr tablet; Take 1 tablet (25 mg total) by mouth daily. -     metoprolol succinate (TOPROL-XL) 25 MG 24 hr tablet; Take 1 tablet (25 mg total) by mouth daily.  I have discontinued Ms. Trueitt-Johnson's HYDROcodone-homatropine. I am also having  her maintain her simvastatin, pantoprazole, losartan-hydrochlorothiazide, potassium chloride SA, and metoprolol succinate.  Meds ordered this encounter  Medications  . DISCONTD: metoprolol succinate (TOPROL-XL) 25 MG 24 hr tablet    Sig: Take 1 tablet (25 mg total) by mouth daily.    Dispense:  30 tablet    Refill:  3  . metoprolol succinate (TOPROL-XL) 25 MG 24 hr tablet    Sig: Take 1 tablet (25 mg total) by mouth daily.    Dispense:  30 tablet    Refill:  3     Follow-up: No Follow-up on file.  Walker Kehr, MD

## 2015-05-26 NOTE — Assessment & Plan Note (Signed)
EKG

## 2015-05-26 NOTE — Patient Instructions (Signed)
Keep appointment w/Dr Doug Sou

## 2015-05-27 ENCOUNTER — Other Ambulatory Visit: Payer: Self-pay | Admitting: Internal Medicine

## 2015-05-27 LAB — PTH, INTACT AND CALCIUM
CALCIUM: 10.7 mg/dL — AB (ref 8.4–10.5)
PTH: 122 pg/mL — AB (ref 14–64)

## 2015-05-27 NOTE — Addendum Note (Signed)
Addended by: Cassandria Anger on: 05/27/2015 10:07 PM   Modules accepted: Orders

## 2015-06-04 ENCOUNTER — Encounter: Payer: 59 | Admitting: Internal Medicine

## 2015-06-12 ENCOUNTER — Encounter: Payer: Self-pay | Admitting: Internal Medicine

## 2015-06-12 ENCOUNTER — Ambulatory Visit (INDEPENDENT_AMBULATORY_CARE_PROVIDER_SITE_OTHER): Payer: 59 | Admitting: Internal Medicine

## 2015-06-12 ENCOUNTER — Other Ambulatory Visit (INDEPENDENT_AMBULATORY_CARE_PROVIDER_SITE_OTHER): Payer: 59

## 2015-06-12 VITALS — BP 110/56 | HR 85 | Temp 98.2°F | Resp 14 | Ht 62.0 in | Wt 171.8 lb

## 2015-06-12 DIAGNOSIS — R000 Tachycardia, unspecified: Secondary | ICD-10-CM

## 2015-06-12 DIAGNOSIS — I10 Essential (primary) hypertension: Secondary | ICD-10-CM

## 2015-06-12 DIAGNOSIS — Z0000 Encounter for general adult medical examination without abnormal findings: Secondary | ICD-10-CM | POA: Diagnosis not present

## 2015-06-12 DIAGNOSIS — E785 Hyperlipidemia, unspecified: Secondary | ICD-10-CM

## 2015-06-12 LAB — LIPID PANEL
CHOLESTEROL: 205 mg/dL — AB (ref 0–200)
HDL: 75.8 mg/dL (ref >39.00)
LDL Cholesterol: 117 mg/dL — ABNORMAL HIGH (ref 0–99)
NonHDL: 129.2
Total CHOL/HDL Ratio: 3
Triglycerides: 60 mg/dL (ref 0.0–149.0)
VLDL: 12 mg/dL (ref 0.0–40.0)

## 2015-06-12 LAB — HEMOGLOBIN A1C: HEMOGLOBIN A1C: 5.7 % (ref 4.6–6.5)

## 2015-06-12 MED ORDER — PANTOPRAZOLE SODIUM 40 MG PO TBEC: 40.0000 mg | DELAYED_RELEASE_TABLET | Freq: Every day | ORAL | Status: DC

## 2015-06-12 MED ORDER — SIMVASTATIN 20 MG PO TABS: 20.0000 mg | ORAL_TABLET | Freq: Every day | ORAL | Status: DC

## 2015-06-12 NOTE — Patient Instructions (Signed)
We will check your blood work today and call you back with the results.   Do not take the metoprolol. You can take 1/2 on days when you know you are going to be eating extra salt or bad foods.   Health Maintenance Adopting a healthy lifestyle and getting preventive care can go a long way to promote health and wellness. Talk with your health care provider about what schedule of regular examinations is right for you. This is a good chance for you to check in with your provider about disease prevention and staying healthy. In between checkups, there are plenty of things you can do on your own. Experts have done a lot of research about which lifestyle changes and preventive measures are most likely to keep you healthy. Ask your health care provider for more information. WEIGHT AND DIET  Eat a healthy diet  Be sure to include plenty of vegetables, fruits, low-fat dairy products, and lean protein.  Do not eat a lot of foods high in solid fats, added sugars, or salt.  Get regular exercise. This is one of the most important things you can do for your health.  Most adults should exercise for at least 150 minutes each week. The exercise should increase your heart rate and make you sweat (moderate-intensity exercise).  Most adults should also do strengthening exercises at least twice a week. This is in addition to the moderate-intensity exercise.  Maintain a healthy weight  Body mass index (BMI) is a measurement that can be used to identify possible weight problems. It estimates body fat based on height and weight. Your health care provider can help determine your BMI and help you achieve or maintain a healthy weight.  For females 3 years of age and older:   A BMI below 18.5 is considered underweight.  A BMI of 18.5 to 24.9 is normal.  A BMI of 25 to 29.9 is considered overweight.  A BMI of 30 and above is considered obese.  Watch levels of cholesterol and blood lipids  You should start  having your blood tested for lipids and cholesterol at 63 years of age, then have this test every 5 years.  You may need to have your cholesterol levels checked more often if:  Your lipid or cholesterol levels are high.  You are older than 63 years of age.  You are at high risk for heart disease.  CANCER SCREENING   Lung Cancer  Lung cancer screening is recommended for adults 11-59 years old who are at high risk for lung cancer because of a history of smoking.  A yearly low-dose CT scan of the lungs is recommended for people who:  Currently smoke.  Have quit within the past 15 years.  Have at least a 30-pack-year history of smoking. A pack year is smoking an average of one pack of cigarettes a day for 1 year.  Yearly screening should continue until it has been 15 years since you quit.  Yearly screening should stop if you develop a health problem that would prevent you from having lung cancer treatment.  Breast Cancer  Practice breast self-awareness. This means understanding how your breasts normally appear and feel.  It also means doing regular breast self-exams. Let your health care provider know about any changes, no matter how small.  If you are in your 20s or 30s, you should have a clinical breast exam (CBE) by a health care provider every 1-3 years as part of a regular health exam.  If you are 40 or older, have a CBE every year. Also consider having a breast X-ray (mammogram) every year.  If you have a family history of breast cancer, talk to your health care provider about genetic screening.  If you are at high risk for breast cancer, talk to your health care provider about having an MRI and a mammogram every year.  Breast cancer gene (BRCA) assessment is recommended for women who have family members with BRCA-related cancers. BRCA-related cancers include:  Breast.  Ovarian.  Tubal.  Peritoneal cancers.  Results of the assessment will determine the need for  genetic counseling and BRCA1 and BRCA2 testing. Cervical Cancer Routine pelvic examinations to screen for cervical cancer are no longer recommended for nonpregnant women who are considered low risk for cancer of the pelvic organs (ovaries, uterus, and vagina) and who do not have symptoms. A pelvic examination may be necessary if you have symptoms including those associated with pelvic infections. Ask your health care provider if a screening pelvic exam is right for you.   The Pap test is the screening test for cervical cancer for women who are considered at risk.  If you had a hysterectomy for a problem that was not cancer or a condition that could lead to cancer, then you no longer need Pap tests.  If you are older than 65 years, and you have had normal Pap tests for the past 10 years, you no longer need to have Pap tests.  If you have had past treatment for cervical cancer or a condition that could lead to cancer, you need Pap tests and screening for cancer for at least 20 years after your treatment.  If you no longer get a Pap test, assess your risk factors if they change (such as having a new sexual partner). This can affect whether you should start being screened again.  Some women have medical problems that increase their chance of getting cervical cancer. If this is the case for you, your health care provider may recommend more frequent screening and Pap tests.  The human papillomavirus (HPV) test is another test that may be used for cervical cancer screening. The HPV test looks for the virus that can cause cell changes in the cervix. The cells collected during the Pap test can be tested for HPV.  The HPV test can be used to screen women 94 years of age and older. Getting tested for HPV can extend the interval between normal Pap tests from three to five years.  An HPV test also should be used to screen women of any age who have unclear Pap test results.  After 63 years of age, women  should have HPV testing as often as Pap tests.  Colorectal Cancer  This type of cancer can be detected and often prevented.  Routine colorectal cancer screening usually begins at 63 years of age and continues through 63 years of age.  Your health care provider may recommend screening at an earlier age if you have risk factors for colon cancer.  Your health care provider may also recommend using home test kits to check for hidden blood in the stool.  A small camera at the end of a tube can be used to examine your colon directly (sigmoidoscopy or colonoscopy). This is done to check for the earliest forms of colorectal cancer.  Routine screening usually begins at age 88.  Direct examination of the colon should be repeated every 5-10 years through 63 years of age. However,  you may need to be screened more often if early forms of precancerous polyps or small growths are found. Skin Cancer  Check your skin from head to toe regularly.  Tell your health care provider about any new moles or changes in moles, especially if there is a change in a mole's shape or color.  Also tell your health care provider if you have a mole that is larger than the size of a pencil eraser.  Always use sunscreen. Apply sunscreen liberally and repeatedly throughout the day.  Protect yourself by wearing long sleeves, pants, a wide-brimmed hat, and sunglasses whenever you are outside. HEART DISEASE, DIABETES, AND HIGH BLOOD PRESSURE   Have your blood pressure checked at least every 1-2 years. High blood pressure causes heart disease and increases the risk of stroke.  If you are between 33 years and 62 years old, ask your health care provider if you should take aspirin to prevent strokes.  Have regular diabetes screenings. This involves taking a blood sample to check your fasting blood sugar level.  If you are at a normal weight and have a low risk for diabetes, have this test once every three years after 63 years  of age.  If you are overweight and have a high risk for diabetes, consider being tested at a younger age or more often. PREVENTING INFECTION  Hepatitis B  If you have a higher risk for hepatitis B, you should be screened for this virus. You are considered at high risk for hepatitis B if:  You were born in a country where hepatitis B is common. Ask your health care provider which countries are considered high risk.  Your parents were born in a high-risk country, and you have not been immunized against hepatitis B (hepatitis B vaccine).  You have HIV or AIDS.  You use needles to inject street drugs.  You live with someone who has hepatitis B.  You have had sex with someone who has hepatitis B.  You get hemodialysis treatment.  You take certain medicines for conditions, including cancer, organ transplantation, and autoimmune conditions. Hepatitis C  Blood testing is recommended for:  Everyone born from [birth year] through 1964.  Anyone with known risk factors for hepatitis C. Sexually transmitted infections (STIs)  You should be screened for sexually transmitted infections (STIs) including gonorrhea and chlamydia if:  You are sexually active and are younger than 63 years of age.  You are older than 63 years of age and your health care provider tells you that you are at risk for this type of infection.  Your sexual activity has changed since you were last screened and you are at an increased risk for chlamydia or gonorrhea. Ask your health care provider if you are at risk.  If you do not have HIV, but are at risk, it may be recommended that you take a prescription medicine daily to prevent HIV infection. This is called pre-exposure prophylaxis (PrEP). You are considered at risk if:  You are sexually active and do not regularly use condoms or know the HIV status of your partner(s).  You take drugs by injection.  You are sexually active with a partner who has HIV. Talk with your  health care provider about whether you are at high risk of being infected with HIV. If you choose to begin PrEP, you should first be tested for HIV. You should then be tested every 3 months for as long as you are taking PrEP.  PREGNANCY   If  you are premenopausal and you may become pregnant, ask your health care provider about preconception counseling.  If you may become pregnant, take 400 to 800 micrograms (mcg) of folic acid every day.  If you want to prevent pregnancy, talk to your health care provider about birth control (contraception). OSTEOPOROSIS AND MENOPAUSE   Osteoporosis is a disease in which the bones lose minerals and strength with aging. This can result in serious bone fractures. Your risk for osteoporosis can be identified using a bone density scan.  If you are 31 years of age or older, or if you are at risk for osteoporosis and fractures, ask your health care provider if you should be screened.  Ask your health care provider whether you should take a calcium or vitamin D supplement to lower your risk for osteoporosis.  Menopause may have certain physical symptoms and risks.  Hormone replacement therapy may reduce some of these symptoms and risks. Talk to your health care provider about whether hormone replacement therapy is right for you.  HOME CARE INSTRUCTIONS   Schedule regular health, dental, and eye exams.  Stay current with your immunizations.   Do not use any tobacco products including cigarettes, chewing tobacco, or electronic cigarettes.  If you are pregnant, do not drink alcohol.  If you are breastfeeding, limit how much and how often you drink alcohol.  Limit alcohol intake to no more than 1 drink per day for nonpregnant women. One drink equals 12 ounces of beer, 5 ounces of wine, or 1 ounces of hard liquor.  Do not use street drugs.  Do not share needles.  Ask your health care provider for help if you need support or information about quitting  drugs.  Tell your health care provider if you often feel depressed.  Tell your health care provider if you have ever been abused or do not feel safe at home. Document Released: 06/27/2011 Document Revised: 04/28/2014 Document Reviewed: 11/13/2013 [facility] Patient Information 2015 [place], [place]. This information is not intended to replace advice given to you by your health care provider. Make sure you discuss any questions you have with your health care provider.

## 2015-06-12 NOTE — Progress Notes (Signed)
   Subjective:    Patient ID: Sarah Santos, female    DOB: 1951/12/15, 64 y.o.   MRN: [id number]  HPI The patient is a 64 YO female here for wellness. Concerns about metoprolol and has not started.   PMH, [facility], social history reviewed and updated.   Review of Systems  Constitutional: Negative for fever, activity change, appetite change, fatigue and unexpected weight change.  HENT: Negative.   Eyes: Negative.   Respiratory: Negative for cough, chest tightness, shortness of breath and wheezing.   Cardiovascular: Positive for palpitations. Negative for chest pain and leg swelling.  Gastrointestinal: Negative for abdominal pain, diarrhea, constipation and abdominal distention.  Musculoskeletal: Negative.   Skin: Negative.   Neurological: Negative.   Psychiatric/Behavioral: Negative.       Objective:   Physical Exam  Constitutional: She is oriented to person, place, and time. She appears well-developed and well-nourished.  HENT:  Head: Normocephalic and atraumatic.  Eyes: EOM are normal.  Neck: Normal range of motion.  Cardiovascular: Regular rhythm.   Slightly fast but regular  Pulmonary/Chest: Effort normal and breath sounds normal. No respiratory distress. She has no wheezes.  Abdominal: Soft. She exhibits no distension. There is no tenderness.  Musculoskeletal: She exhibits no edema.  Neurological: She is alert and oriented to person, place, and time.  Skin: Skin is warm and dry.  Psychiatric: She has a normal mood and affect.   Filed Vitals:   06/12/15 1435  BP: 110/56  Pulse: 85  Temp: 98.2 F (36.8 C)  TempSrc: Oral  Resp: 14  Height: 5\' 2"  (1.575 m)  Weight: 171 lb 12.8 oz (77.928 kg)  SpO2: 98%      Assessment & Plan:

## 2015-06-12 NOTE — Progress Notes (Signed)
Pre visit review using our clinic review tool, if applicable. No additional management support is needed unless otherwise documented below in the visit note. 

## 2015-06-13 DIAGNOSIS — Z0000 Encounter for general adult medical examination without abnormal findings: Secondary | ICD-10-CM | POA: Insufficient documentation

## 2015-06-13 HISTORY — DX: Encounter for general adult medical examination without abnormal findings: Z00.00

## 2015-06-13 NOTE — Assessment & Plan Note (Signed)
Did not start metoprolol which is okay. She notices that when she eats salty foods or bad foods it is more likely to happen. Never goes above 110 when fast. Okay to take metoprolol on days she knows dietary indiscretion (i.e upcoming family reunion).

## 2015-06-13 NOTE — Assessment & Plan Note (Signed)
Checking lipid panel today. No side effects, continue simvastatin.

## 2015-06-13 NOTE — Assessment & Plan Note (Signed)
Talked to her about tetanus and zostavac and she does not want today. Talked to her about HIV and hepatitis screening and she does not feel the need for that. Checking lipid and HgA1c today. Talked to her about more exercise. She is a non-smoker. Talked to her about sun cancer prevention. She is up to date on mammogram and colonoscopy.

## 2015-06-13 NOTE — Assessment & Plan Note (Signed)
Continue losartan/hctz. Not taking metoprolol and okay to not. BP at goal.

## 2015-06-25 ENCOUNTER — Other Ambulatory Visit: Payer: Self-pay | Admitting: Internal Medicine

## 2015-06-26 ENCOUNTER — Other Ambulatory Visit: Payer: Self-pay

## 2015-06-26 MED ORDER — POTASSIUM CHLORIDE CRYS ER 20 MEQ PO TBCR: 20.0000 meq | EXTENDED_RELEASE_TABLET | Freq: Every day | ORAL | Status: DC

## 2015-07-08 ENCOUNTER — Encounter: Payer: Self-pay | Admitting: Endocrinology

## 2015-07-08 ENCOUNTER — Ambulatory Visit (INDEPENDENT_AMBULATORY_CARE_PROVIDER_SITE_OTHER): Payer: 59 | Admitting: Endocrinology

## 2015-07-08 LAB — VITAMIN D 25 HYDROXY (VIT D DEFICIENCY, FRACTURES): VITD: 22.52 ng/mL — ABNORMAL LOW (ref 30.00–100.00)

## 2015-07-08 MED ORDER — LOSARTAN POTASSIUM-HCTZ 100-12.5 MG PO TABS: 1.0000 | ORAL_TABLET | Freq: Every day | ORAL | Status: DC

## 2015-07-08 NOTE — Progress Notes (Signed)
Subjective:    Patient ID: Sarah Santos, female    DOB: 30-Oct-1951, 64 y.o.   MRN: [id number]  HPI Pt was first noted to have hypercalcemia in 2010, and PTH level was high in 2012.  she did not meet criteria for surgery.  She has never had PUD, pancreatitis, cancer, depression, sarcoidosis, or bony fracture.  she does not take vitamin-D or A supplements.  She takes HCTZ for HTN. urol eval for hematuria was neg in 2014.  She has intermittent palpitations in the chest, but no assoc polyuria.   Past Medical History  Diagnosis Date  . HYPERLIPIDEMIA   . ALLERGIC RHINITIS   . HEADACHE, CHRONIC   . Primary hyperparathyroidism   . Hypercalcemia   . GERD   . Hx of adenomatous colonic polyps   . Hematuria, microscopic   . 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 Hypertension     Past Surgical History  Procedure Laterality Date  . Tubal ligation    . Colonoscopy w/ polypectomy  2009, 08/18/11    2009:adenomas, largest 4 cm, rectal carcinoid, hemorrhoids    History   Social History  . Marital Status: Divorced    Spouse Name: N/A  . Number of Children: N/A  . Years of Education: N/A   Occupational History  . Not on file.   Social History Main Topics  . Smoking status: Former Smoker    Quit date: 03/03/1991  . Smokeless tobacco: Not on file  . Alcohol Use: No  . Drug Use: No  . Sexual Activity: Not on file   Other Topics Concern  . 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 pantoprazole (PROTONIX) 40 MG tablet Take 1 tablet (40 mg total) by mouth daily. 90 tablet 3  . potassium chloride SA (K-DUR,KLOR-CON) 20 MEQ tablet Take 1 tablet (20 mEq total) by mouth daily. 90 tablet 3  . simvastatin (ZOCOR) 20 MG tablet Take 1 tablet (20 mg total) by mouth daily at 6 PM. 90 tablet 3  . metoprolol  succinate (TOPROL-XL) 25 MG 24 hr tablet Take 1 tablet (25 mg total) by mouth daily. (Patient not taking: Reported on 07/08/2015) 30 tablet 3   No current facility-administered medications on file prior to visit.    No Known Allergies  Family History  Problem Relation Age of Onset  . Heart disease Mother 73    PCI  . Cancer Other     Lung  . Heart disease Other     Coronary Atery Disease  . Colon cancer Neg Hx   . Colon polyps Neg Hx   . Other Neg Hx     hyperparathyroidism    BP 108/60 mmHg  Pulse 70  Temp(Src) 98.1 F (36.7 C) (Oral)  Ht 5\' 2"  (1.575 m)  Wt 170 lb (77.111 kg)  BMI 31.09 kg/m2  SpO2 99%     Review of Systems She denies memory loss.  She has menopausal sxs.      Objective:   Physical Exam VS: see vs page GEN: no distress HEAD: head: no deformity eyes: no periorbital swelling, no proptosis external nose and ears are normal mouth: no lesion seen NECK: supple, thyroid is not enlarged CHEST WALL: no deformity LUNGS:  Clear to auscultation CV: reg rate and rhythm, no murmur ABD:  abdomen is soft, nontender.  no hepatosplenomegaly.  not distended.  no hernia MUSCULOSKELETAL: muscle bulk and strength are grossly normal.  no obvious joint swelling.  gait is normal and steady EXTEMITIES: no deformity.  no ulcer on the feet.  feet are of normal color and temp.  no edema PULSES: dorsalis pedis intact bilat.  no carotid bruit NEURO:  cn 2-12 grossly intact.   readily moves all 4's.  sensation is intact to touch on the feet SKIN:  Normal texture and temperature.  No rash or suspicious lesion is visible.   NODES:  None palpable at the neck PSYCH: alert, well-oriented.  Does not appear anxious nor depressed.   Lab Results  Component Value Date   PTH 122* 05/26/2015   CALCIUM 11.0* 05/26/2015   CALCIUM 10.7* 05/26/2015   i personally reviewed electrocardiogram tracing (05/11/15): Nonspecific T-wave abnormality.    Assessment & Plan:    Hyperparathyroidism, probably primary, as vit-D is only slightly low. Hypercalcemia: prob exac by HCTZ.  She wants to use up current hyzaar (2 mos worth), before reducing.  Patient is advised the following: Patient Instructions  After you use up the current pills, please change the losartan-HCTZ to 100/12.5, 1 pill per day.   blood tests are requested for you today.  We'll let you know about the results.   Please come back for a follow-up appointment in 3 months.      addendum: take non-prescription vitamin-D, 2000 units per day.

## 2015-07-08 NOTE — Patient Instructions (Addendum)
After you use up the current pills, please change the losartan-HCTZ to 100/12.5, 1 pill per day.   blood tests are requested for you today.  We'll let you know about the results.   Please come back for a follow-up appointment in 3 months.

## 2015-09-01 ENCOUNTER — Other Ambulatory Visit: Payer: Self-pay | Admitting: Obstetrics and Gynecology

## 2015-09-01 DIAGNOSIS — N6452 Nipple discharge: Secondary | ICD-10-CM

## 2015-09-11 ENCOUNTER — Ambulatory Visit
Admission: RE | Admit: 2015-09-11 | Discharge: 2015-09-11 | Disposition: A | Discharge status: Home / Self Care | Payer: 59 | Source: Ambulatory Visit | Attending: Obstetrics and Gynecology | Admitting: Obstetrics and Gynecology

## 2015-09-11 DIAGNOSIS — N6452 Nipple discharge: Secondary | ICD-10-CM

## 2016-02-08 MED FILL — PANTOPRAZOLE SOD DR 40 MG T: 40 | 90 days supply | Qty: 90 | Fill #3

## 2016-03-02 MED FILL — LOSARTAN-HCTZ 50-12.5 MG TA: 50-12.5 | 90 days supply | Qty: 180 | Fill #3

## 2016-03-02 MED FILL — POTASSIUM CL ER 20 MEQ TAB: 20 | 90 days supply | Qty: 90 | Fill #1

## 2016-04-22 MED FILL — SIMVASTATIN 20 MG TABLET: 20 | 90 days supply | Qty: 90 | Fill #1

## 2016-05-26 ENCOUNTER — Other Ambulatory Visit: Payer: Self-pay | Admitting: *Deleted

## 2016-05-26 MED ORDER — LOSARTAN POTASSIUM-HCTZ 100-12.5 MG PO TABS: 1.0000 | ORAL_TABLET | Freq: Every day | ORAL | Status: DC

## 2016-05-26 MED FILL — LOSARTAN-HCTZ 100-12.5 MG T: 100-12.5 | 30 days supply | Qty: 30 | Fill #0

## 2016-05-26 MED FILL — PANTOPRAZOLE SOD DR 40 MG T: 40 | 90 days supply | Qty: 90 | Fill #0

## 2016-05-30 ENCOUNTER — Telehealth: Payer: Self-pay | Admitting: Internal Medicine

## 2016-05-30 NOTE — Telephone Encounter (Signed)
Please follow up with patient in regard to losartan.  States she usually takes 50mg  twice a day.

## 2016-05-31 ENCOUNTER — Telehealth: Payer: Self-pay

## 2016-05-31 NOTE — Telephone Encounter (Signed)
Patient called back to check on the this medication.

## 2016-05-31 NOTE — Telephone Encounter (Signed)
Please follow up with patient in regard to losartan. States she usually takes 50mg  twice a day. - Patient called back again today about this.

## 2016-06-01 ENCOUNTER — Other Ambulatory Visit: Payer: Self-pay | Admitting: Geriatric Medicine

## 2016-06-01 MED ORDER — LOSARTAN POTASSIUM-HCTZ 50-12.5 MG PO TABS: 1.0000 | ORAL_TABLET | Freq: Two times a day (BID) | ORAL | Status: DC

## 2016-06-01 MED FILL — LOSARTAN-HCTZ 50-12.5 MG TA: 50-12.5 | 30 days supply | Qty: 60 | Fill #0

## 2016-06-01 NOTE — Telephone Encounter (Signed)
I did not change her medicine, why was this sent in with different dosing? Do not see that it was prescribed at all prior to the refill sent 05/26/16. She also needs visit for any refills. Also for some reason the 50/12.5 was taken off her list. This is not equivalent to 100/12.5 mg losartan hctz.

## 2016-06-01 NOTE — Telephone Encounter (Signed)
Patient returned phone call. °

## 2016-06-01 NOTE — Telephone Encounter (Signed)
Left message for patient to call back  

## 2016-06-01 NOTE — Telephone Encounter (Signed)
Dr. Loanne Drilling changed Losartan in July of 2016 to the higher dosage. Patient was not aware and never took the higher dosage. She continued taking the Losartan HCTZ 50/12.5. Dr. Sharlet Salina reviewed chart and sent in a one months supply of the Losartan HCTZ 50/12.5 with 0 refills to hold patient until her appt coming up this month on the 23rd. I spoke to patient and made her aware of all of this and explained that she must keep that appointment.  Patient agreed.

## 2016-06-01 NOTE — Telephone Encounter (Signed)
Patient says she used to take Losartan HCTZ 50/12.5 twice daily and it was recently changed to 100/12.5 once daily. She said her pressure drops too much when she takes it all at once like that and she would like for [place] to change it back. Please advise, thanks.

## 2016-06-17 ENCOUNTER — Encounter: Payer: 59 | Admitting: Internal Medicine

## 2016-06-17 ENCOUNTER — Telehealth: Payer: Self-pay | Admitting: Internal Medicine

## 2016-06-17 ENCOUNTER — Other Ambulatory Visit: Payer: Self-pay | Admitting: Geriatric Medicine

## 2016-06-17 DIAGNOSIS — Z0289 Encounter for other administrative examinations: Secondary | ICD-10-CM

## 2016-06-17 MED ORDER — LOSARTAN POTASSIUM-HCTZ 100-12.5 MG PO TABS: 1.0000 | ORAL_TABLET | Freq: Every day | ORAL | Status: DC

## 2016-06-17 NOTE — Telephone Encounter (Signed)
Pharmacy called stating patient is requesting 50/12.5 mg to be taken twice a day instead of 100mg .  Please call [facility] back in regard.

## 2016-06-17 NOTE — Telephone Encounter (Signed)
Sent to pharmacy. No more refills until patient is seen.

## 2016-06-17 NOTE — Telephone Encounter (Signed)
Pt missed appt but needs her losartan refilled, she could not get another appt til July 27th.  She needs a refill enough to make it to that day.    Ph[facility], [facility]

## 2016-06-17 NOTE — Telephone Encounter (Signed)
Please advise on refill, thanks.

## 2016-06-17 NOTE — Telephone Encounter (Signed)
This was just sent in on 06/01/16 so should not need refill today. Can get 1 month but no more. Needs to keep visit.

## 2016-06-20 NOTE — Telephone Encounter (Signed)
Left message on pharmacy message line calling in Losartan HCTZ 50/12.5 twice daily, 60 tabs, 0 refills, no more refills until patient comes in for an office visit.

## 2016-06-22 ENCOUNTER — Other Ambulatory Visit: Payer: Self-pay | Admitting: *Deleted

## 2016-06-22 MED ORDER — LOSARTAN POTASSIUM-HCTZ 50-12.5 MG PO TABS: 1.0000 | ORAL_TABLET | Freq: Two times a day (BID) | ORAL | Status: DC

## 2016-06-22 MED FILL — LOSARTAN-HCTZ 50-12.5 MG TA: 50-12.5 | 30 days supply | Qty: 60 | Fill #0

## 2016-06-22 NOTE — Telephone Encounter (Signed)
Received call pt states she has an appt on 7/27, but she is almost out of her Losartan. Needing refill sent to pharmacy.Notified pt rx has been sent to [facility]...Johny Chess

## 2016-07-21 ENCOUNTER — Telehealth: Payer: Self-pay | Admitting: Emergency Medicine

## 2016-07-21 ENCOUNTER — Ambulatory Visit (INDEPENDENT_AMBULATORY_CARE_PROVIDER_SITE_OTHER): Payer: 59 | Admitting: Internal Medicine

## 2016-07-21 ENCOUNTER — Other Ambulatory Visit (INDEPENDENT_AMBULATORY_CARE_PROVIDER_SITE_OTHER): Payer: 59

## 2016-07-21 ENCOUNTER — Encounter: Payer: Self-pay | Admitting: Internal Medicine

## 2016-07-21 VITALS — BP 112/76 | HR 70 | Temp 98.4°F | Resp 12 | Ht 61.0 in | Wt 175.0 lb

## 2016-07-21 DIAGNOSIS — I10 Essential (primary) hypertension: Secondary | ICD-10-CM | POA: Diagnosis not present

## 2016-07-21 DIAGNOSIS — E785 Hyperlipidemia, unspecified: Secondary | ICD-10-CM

## 2016-07-21 DIAGNOSIS — E210 Primary hyperparathyroidism: Secondary | ICD-10-CM

## 2016-07-21 DIAGNOSIS — Z0000 Encounter for general adult medical examination without abnormal findings: Secondary | ICD-10-CM | POA: Diagnosis not present

## 2016-07-21 LAB — COMPREHENSIVE METABOLIC PANEL
ALBUMIN: 4.3 g/dL (ref 3.5–5.2)
ALK PHOS: 87 U/L (ref 39–117)
ALT: 14 U/L (ref 0–35)
AST: 15 U/L (ref 0–37)
BILIRUBIN TOTAL: 0.4 mg/dL (ref 0.2–1.2)
BUN: 28 mg/dL — ABNORMAL HIGH (ref 6–23)
CALCIUM: 11 mg/dL — AB (ref 8.4–10.5)
CO2: 30 mEq/L (ref 19–32)
Chloride: 103 mEq/L (ref 96–112)
Creatinine, Ser: 1.05 mg/dL (ref 0.40–1.20)
GFR: 67.76 mL/min (ref >60.00)
Glucose, Bld: 93 mg/dL (ref 70–99)
Potassium: 3.8 mEq/L (ref 3.5–5.1)
Sodium: 138 mEq/L (ref 135–145)
TOTAL PROTEIN: 8 g/dL (ref 6.0–8.3)

## 2016-07-21 LAB — LIPID PANEL
Cholesterol: 219 mg/dL — ABNORMAL HIGH (ref 0–200)
HDL: 72.3 mg/dL (ref >39.00)
LDL Cholesterol: 126 mg/dL — ABNORMAL HIGH (ref 0–99)
NONHDL: 146.4
TRIGLYCERIDES: 103 mg/dL (ref 0.0–149.0)
Total CHOL/HDL Ratio: 3
VLDL: 20.6 mg/dL (ref 0.0–40.0)

## 2016-07-21 LAB — TSH: TSH: 0.67 u[IU]/mL (ref 0.35–4.50)

## 2016-07-21 LAB — CBC
HEMATOCRIT: 33 % — AB (ref 36.0–46.0)
HEMOGLOBIN: 10.7 g/dL — AB (ref 12.0–15.0)
MCHC: 32.4 g/dL (ref 30.0–36.0)
MCV: 79.3 fl (ref 78.0–100.0)
Platelets: 269 10*3/uL (ref 150.0–400.0)
RBC: 4.17 Mil/uL (ref 3.87–5.11)
RDW: 14 % (ref 11.5–15.5)
WBC: 6.7 10*3/uL (ref 4.0–10.5)

## 2016-07-21 LAB — VITAMIN B12: Vitamin B-12: 411 pg/mL (ref 211–911)

## 2016-07-21 MED ORDER — LOSARTAN POTASSIUM-HCTZ 50-12.5 MG PO TABS: 1.0000 | ORAL_TABLET | Freq: Every day | ORAL | 3 refills | Status: DC

## 2016-07-21 MED ORDER — SIMVASTATIN 20 MG PO TABS: 20.0000 mg | ORAL_TABLET | Freq: Every day | ORAL | 3 refills | Status: DC

## 2016-07-21 MED ORDER — LOSARTAN POTASSIUM 50 MG PO TABS: 50.0000 mg | ORAL_TABLET | Freq: Every day | ORAL | 3 refills | Status: DC

## 2016-07-21 MED ORDER — PANTOPRAZOLE SODIUM 40 MG PO TBEC: 40.0000 mg | DELAYED_RELEASE_TABLET | Freq: Every day | ORAL | 3 refills | Status: DC

## 2016-07-21 MED FILL — LOSARTAN POTASSIUM 50 MG TA: 50 | 90 days supply | Qty: 90 | Fill #0

## 2016-07-21 MED FILL — LOSARTAN-HCTZ 50-12.5 MG TA: 50-12.5 | 90 days supply | Qty: 90 | Fill #0

## 2016-07-21 MED FILL — SIMVASTATIN 20 MG TABLET: 20 | 90 days supply | Qty: 90 | Fill #0

## 2016-07-21 NOTE — Progress Notes (Signed)
   Subjective:    Patient ID: Sarah Santos, female    DOB: [birth year], 65 y.o.   MRN: [id number]  HPI The patient is a 65 YO female coming in for wellness. There has been some confusion on her blood pressure medicine since her last visit. She was asked to switch to losartan/hctz 100/12.5 daily which she did not do since her BP falls low with taking her losartan/hctz 50/12.5 and then she takes it again in the afternoon and this does okay with her BP. No syncope or faintness and she is still taking her BP meds as prescribed by [place].   PMH, [facility], social history reviewed and updated.   Review of Systems  Constitutional: Negative for activity change, appetite change, fatigue, fever and unexpected weight change.  HENT: Negative.   Eyes: Negative.   Respiratory: Negative for cough, chest tightness, shortness of breath and wheezing.   Cardiovascular: Negative for chest pain, palpitations and leg swelling.  Gastrointestinal: Negative for abdominal distention, abdominal pain, constipation and diarrhea.  Musculoskeletal: Positive for arthralgias.       Mild  Skin: Negative.   Neurological: Negative.   Psychiatric/Behavioral: Negative.       Objective:   Physical Exam  Constitutional: She is oriented to person, place, and time. She appears well-developed and well-nourished.  HENT:  Head: Normocephalic and atraumatic.  Eyes: EOM are normal.  Neck: Normal range of motion.  Cardiovascular: Normal rate and regular rhythm.   No murmur heard. Pulmonary/Chest: Effort normal and breath sounds normal. No respiratory distress. She has no wheezes.  Abdominal: Soft. She exhibits no distension. There is no tenderness.  Musculoskeletal: She exhibits no edema.  Neurological: She is alert and oriented to person, place, and time.  Skin: Skin is warm and dry.  Psychiatric: She has a normal mood and affect.   Vitals:   07/21/16 1016  BP: 112/76  Pulse: 70  Resp: 12  Temp: 98.4 F (36.9 C)    TempSrc: Oral  SpO2: 98%  Weight: 175 lb (79.4 kg)  Height: 5\' 1"  (1.549 m)      Assessment & Plan:

## 2016-07-21 NOTE — Patient Instructions (Signed)
We have sent in the losartan/hctz 50/12.5 mg in the morning and take losartan 50 mg only in the afternoon.   Stop taking the potassium.   We are checking the labs today.   Health Maintenance, Female Adopting a healthy lifestyle and getting preventive care can go a long way to promote health and wellness. Talk with your health care provider about what schedule of regular examinations is right for you. This is a good chance for you to check in with your provider about disease prevention and staying healthy. In between checkups, there are plenty of things you can do on your own. Experts have done a lot of research about which lifestyle changes and preventive measures are most likely to keep you healthy. Ask your health care provider for more information. WEIGHT AND DIET  Eat a healthy diet  Be sure to include plenty of vegetables, fruits, low-fat dairy products, and lean protein.  Do not eat a lot of foods high in solid fats, added sugars, or salt.  Get regular exercise. This is one of the most important things you can do for your health.  Most adults should exercise for at least 150 minutes each week. The exercise should increase your heart rate and make you sweat (moderate-intensity exercise).  Most adults should also do strengthening exercises at least twice a week. This is in addition to the moderate-intensity exercise.  Maintain a healthy weight  Body mass index (BMI) is a measurement that can be used to identify possible weight problems. It estimates body fat based on height and weight. Your health care provider can help determine your BMI and help you achieve or maintain a healthy weight.  For females 68 years of age and older:   A BMI below 18.5 is considered underweight.  A BMI of 18.5 to 24.9 is normal.  A BMI of 25 to 29.9 is considered overweight.  A BMI of 30 and above is considered obese.  Watch levels of cholesterol and blood lipids  You should start having your  blood tested for lipids and cholesterol at 64 years of age, then have this test every 5 years.  You may need to have your cholesterol levels checked more often if:  Your lipid or cholesterol levels are high.  You are older than 64 years of age.  You are at high risk for heart disease.  CANCER SCREENING   Lung Cancer  Lung cancer screening is recommended for adults 16-102 years old who are at high risk for lung cancer because of a history of smoking.  A yearly low-dose CT scan of the lungs is recommended for people who:  Currently smoke.  Have quit within the past 15 years.  Have at least a 30-pack-year history of smoking. A pack year is smoking an average of one pack of cigarettes a day for 1 year.  Yearly screening should continue until it has been 15 years since you quit.  Yearly screening should stop if you develop a health problem that would prevent you from having lung cancer treatment.  Breast Cancer  Practice breast self-awareness. This means understanding how your breasts normally appear and feel.  It also means doing regular breast self-exams. Let your health care provider know about any changes, no matter how small.  If you are in your 20s or 30s, you should have a clinical breast exam (CBE) by a health care provider every 1-3 years as part of a regular health exam.  If you are 40 or older,  have a CBE every year. Also consider having a breast X-ray (mammogram) every year.  If you have a family history of breast cancer, talk to your health care provider about genetic screening.  If you are at high risk for breast cancer, talk to your health care provider about having an MRI and a mammogram every year.  Breast cancer gene (BRCA) assessment is recommended for women who have family members with BRCA-related cancers. BRCA-related cancers include:  Breast.  Ovarian.  Tubal.  Peritoneal cancers.  Results of the assessment will determine the need for genetic  counseling and BRCA1 and BRCA2 testing. Cervical Cancer Your health care provider may recommend that you be screened regularly for cancer of the pelvic organs (ovaries, uterus, and vagina). This screening involves a pelvic examination, including checking for microscopic changes to the surface of your cervix (Pap test). You may be encouraged to have this screening done every 3 years, beginning at age 21.  For women ages 30-65, health care providers may recommend pelvic exams and Pap testing every 3 years, or they may recommend the Pap and pelvic exam, combined with testing for human papilloma virus (HPV), every 5 years. Some types of HPV increase your risk of cervical cancer. Testing for HPV may also be done on women of any age with unclear Pap test results.  Other health care providers may not recommend any screening for nonpregnant women who are considered low risk for pelvic cancer and who do not have symptoms. Ask your health care provider if a screening pelvic exam is right for you.  If you have had past treatment for cervical cancer or a condition that could lead to cancer, you need Pap tests and screening for cancer for at least 20 years after your treatment. If Pap tests have been discontinued, your risk factors (such as having a new sexual partner) need to be reassessed to determine if screening should resume. Some women have medical problems that increase the chance of getting cervical cancer. In these cases, your health care provider may recommend more frequent screening and Pap tests. Colorectal Cancer  This type of cancer can be detected and often prevented.  Routine colorectal cancer screening usually begins at 64 years of age and continues through 64 years of age.  Your health care provider may recommend screening at an earlier age if you have risk factors for colon cancer.  Your health care provider may also recommend using home test kits to check for hidden blood in the stool.  A  small camera at the end of a tube can be used to examine your colon directly (sigmoidoscopy or colonoscopy). This is done to check for the earliest forms of colorectal cancer.  Routine screening usually begins at age 50.  Direct examination of the colon should be repeated every 5-10 years through 64 years of age. However, you may need to be screened more often if early forms of precancerous polyps or small growths are found. Skin Cancer  Check your skin from head to toe regularly.  Tell your health care provider about any new moles or changes in moles, especially if there is a change in a mole's shape or color.  Also tell your health care provider if you have a mole that is larger than the size of a pencil eraser.  Always use sunscreen. Apply sunscreen liberally and repeatedly throughout the day.  Protect yourself by wearing long sleeves, pants, a wide-brimmed hat, and sunglasses whenever you are outside. HEART DISEASE, DIABETES, AND   HIGH BLOOD PRESSURE   High blood pressure causes heart disease and increases the risk of stroke. High blood pressure is more likely to develop in:  People who have blood pressure in the high end of the normal range (130-139/85-89 mm Hg).  People who are overweight or obese.  People who are African American.  If you are 32-68 years of age, have your blood pressure checked every 3-5 years. If you are 41 years of age or older, have your blood pressure checked every year. You should have your blood pressure measured twice--once when you are at a hospital or clinic, and once when you are not at a hospital or clinic. Record the average of the two measurements. To check your blood pressure when you are not at a hospital or clinic, you can use:  An automated blood pressure machine at a pharmacy.  A home blood pressure monitor.  If you are between 56 years and 69 years old, ask your health care provider if you should take aspirin to prevent strokes.  Have  regular diabetes screenings. This involves taking a blood sample to check your fasting blood sugar level.  If you are at a normal weight and have a low risk for diabetes, have this test once every three years after 64 years of age.  If you are overweight and have a high risk for diabetes, consider being tested at a younger age or more often. PREVENTING INFECTION  Hepatitis B  If you have a higher risk for hepatitis B, you should be screened for this virus. You are considered at high risk for hepatitis B if:  You were born in a country where hepatitis B is common. Ask your health care provider which countries are considered high risk.  Your parents were born in a high-risk country, and you have not been immunized against hepatitis B (hepatitis B vaccine).  You have HIV or AIDS.  You use needles to inject street drugs.  You live with someone who has hepatitis B.  You have had sex with someone who has hepatitis B.  You get hemodialysis treatment.  You take certain medicines for conditions, including cancer, organ transplantation, and autoimmune conditions. Hepatitis C  Blood testing is recommended for:  Everyone born from [birth year] through 1964.  Anyone with known risk factors for hepatitis C. Sexually transmitted infections (STIs)  You should be screened for sexually transmitted infections (STIs) including gonorrhea and chlamydia if:  You are sexually active and are younger than 64 years of age.  You are older than 64 years of age and your health care provider tells you that you are at risk for this type of infection.  Your sexual activity has changed since you were last screened and you are at an increased risk for chlamydia or gonorrhea. Ask your health care provider if you are at risk.  If you do not have HIV, but are at risk, it may be recommended that you take a prescription medicine daily to prevent HIV infection. This is called pre-exposure prophylaxis (PrEP). You are  considered at risk if:  You are sexually active and do not regularly use condoms or know the HIV status of your partner(s).  You take drugs by injection.  You are sexually active with a partner who has HIV. Talk with your health care provider about whether you are at high risk of being infected with HIV. If you choose to begin PrEP, you should first be tested for HIV. You should then be tested  every 3 months for as long as you are taking PrEP.  PREGNANCY   If you are premenopausal and you may become pregnant, ask your health care provider about preconception counseling.  If you may become pregnant, take 400 to 800 micrograms (mcg) of folic acid every day.  If you want to prevent pregnancy, talk to your health care provider about birth control (contraception). OSTEOPOROSIS AND MENOPAUSE   Osteoporosis is a disease in which the bones lose minerals and strength with aging. This can result in serious bone fractures. Your risk for osteoporosis can be identified using a bone density scan.  If you are 67 years of age or older, or if you are at risk for osteoporosis and fractures, ask your health care provider if you should be screened.  Ask your health care provider whether you should take a calcium or vitamin D supplement to lower your risk for osteoporosis.  Menopause may have certain physical symptoms and risks.  Hormone replacement therapy may reduce some of these symptoms and risks. Talk to your health care provider about whether hormone replacement therapy is right for you.  HOME CARE INSTRUCTIONS   Schedule regular health, dental, and eye exams.  Stay current with your immunizations.   Do not use any tobacco products including cigarettes, chewing tobacco, or electronic cigarettes.  If you are pregnant, do not drink alcohol.  If you are breastfeeding, limit how much and how often you drink alcohol.  Limit alcohol intake to no more than 1 drink per day for nonpregnant women. One  drink equals 12 ounces of beer, 5 ounces of wine, or 1 ounces of hard liquor.  Do not use street drugs.  Do not share needles.  Ask your health care provider for help if you need support or information about quitting drugs.  Tell your health care provider if you often feel depressed.  Tell your health care provider if you have ever been abused or do not feel safe at home.   This information is not intended to replace advice given to you by your health care provider. Make sure you discuss any questions you have with your health care provider.   Document Released: 06/27/2011 Document Revised: 01/02/2015 Document Reviewed: 11/13/2013 Elsevier Interactive Patient Education [employer].

## 2016-07-21 NOTE — Progress Notes (Signed)
Pre visit review using our clinic review tool, if applicable. No additional management support is needed unless otherwise documented below in the visit note. 

## 2016-07-21 NOTE — Telephone Encounter (Signed)
Pharmacy called and wanted to know if you wanted the pt to take both losartan (COZAAR) 50 MG tablet and losartan-hydrochlorothiazide (HYZAAR) 50-12.5 MG tablet. Please follow up thanks.

## 2016-07-21 NOTE — Assessment & Plan Note (Signed)
Checking lipid panel and adjust simvastatin 20 mg daily as needed.  

## 2016-07-21 NOTE — Assessment & Plan Note (Signed)
Checking calcium and PTH today. Asymptomatic at this time.

## 2016-07-21 NOTE — Telephone Encounter (Signed)
Called pharmacy, no answer. Called patient and she said she gave them the correct information that she is to take both medications.

## 2016-07-21 NOTE — Assessment & Plan Note (Signed)
Declines tdap and hep c screening today. Reminded about the need to get 1 more pap smear for screening and she will think about it. Checking labs today and declines hiv screening as well. Counseled on the dangers of distracted driving. Given screening recommendations.

## 2016-07-21 NOTE — Assessment & Plan Note (Signed)
Will rx losartan/hctz 50/12.5 mg q am and losartan 50 mg q pm for her BP. Checking CMP today. Will have her stop taking potassium since we are decreasing her hctz dosage.

## 2016-07-22 LAB — PTH, INTACT AND CALCIUM
CALCIUM: 10.9 mg/dL — AB (ref 8.4–10.5)
PTH: 84 pg/mL — AB (ref 14–64)

## 2016-08-24 MED FILL — PANTOPRAZOLE SOD DR 40 MG T: 40 | 90 days supply | Qty: 90 | Fill #0

## 2016-09-05 ENCOUNTER — Encounter: Payer: Self-pay | Admitting: Internal Medicine

## 2016-10-07 DIAGNOSIS — Z01419 Encounter for gynecological examination (general) (routine) without abnormal findings: Secondary | ICD-10-CM | POA: Diagnosis not present

## 2016-10-07 DIAGNOSIS — R21 Rash and other nonspecific skin eruption: Secondary | ICD-10-CM | POA: Diagnosis not present

## 2016-10-07 DIAGNOSIS — Z6834 Body mass index (BMI) 34.0-34.9, adult: Secondary | ICD-10-CM | POA: Diagnosis not present

## 2016-10-07 DIAGNOSIS — Z124 Encounter for screening for malignant neoplasm of cervix: Secondary | ICD-10-CM | POA: Diagnosis not present

## 2016-10-12 DIAGNOSIS — R069 Unspecified abnormalities of breathing: Secondary | ICD-10-CM | POA: Diagnosis not present

## 2016-10-12 DIAGNOSIS — R062 Wheezing: Secondary | ICD-10-CM | POA: Diagnosis not present

## 2016-10-17 MED FILL — LOSARTAN POTASSIUM 50 MG TA: 50 | 90 days supply | Qty: 90 | Fill #1

## 2016-10-17 MED FILL — LOSARTAN-HCTZ 50-12.5 MG TA: 50-12.5 | 90 days supply | Qty: 90 | Fill #1

## 2016-11-23 MED FILL — PANTOPRAZOLE SOD DR 40 MG T: 40 | 90 days supply | Qty: 90 | Fill #1

## 2017-01-13 MED FILL — LOSARTAN-HCTZ 50-12.5 MG TA: 50-12.5 | 90 days supply | Qty: 90 | Fill #2

## 2017-01-13 MED FILL — LOSARTAN POTASSIUM 50 MG TA: 50 | 90 days supply | Qty: 90 | Fill #2

## 2017-01-20 ENCOUNTER — Encounter: Payer: 59 | Admitting: Internal Medicine

## 2017-02-13 MED FILL — PANTOPRAZOLE SOD DR 40 MG T: 40 | 90 days supply | Qty: 90 | Fill #2

## 2017-02-24 ENCOUNTER — Ambulatory Visit (INDEPENDENT_AMBULATORY_CARE_PROVIDER_SITE_OTHER): Payer: 59 | Admitting: Internal Medicine

## 2017-02-24 ENCOUNTER — Encounter: Payer: Self-pay | Admitting: Internal Medicine

## 2017-02-24 ENCOUNTER — Other Ambulatory Visit (INDEPENDENT_AMBULATORY_CARE_PROVIDER_SITE_OTHER): Payer: 59

## 2017-02-24 VITALS — BP 122/62 | HR 76 | Temp 98.3°F | Ht 61.0 in | Wt 183.0 lb

## 2017-02-24 DIAGNOSIS — Z0000 Encounter for general adult medical examination without abnormal findings: Secondary | ICD-10-CM

## 2017-02-24 DIAGNOSIS — I10 Essential (primary) hypertension: Secondary | ICD-10-CM | POA: Diagnosis not present

## 2017-02-24 DIAGNOSIS — E785 Hyperlipidemia, unspecified: Secondary | ICD-10-CM | POA: Diagnosis not present

## 2017-02-24 DIAGNOSIS — E210 Primary hyperparathyroidism: Secondary | ICD-10-CM | POA: Diagnosis not present

## 2017-02-24 DIAGNOSIS — R0683 Snoring: Secondary | ICD-10-CM

## 2017-02-24 DIAGNOSIS — K219 Gastro-esophageal reflux disease without esophagitis: Secondary | ICD-10-CM | POA: Diagnosis not present

## 2017-02-24 LAB — LIPID PANEL
CHOL/HDL RATIO: 3
CHOLESTEROL: 213 mg/dL — AB (ref 0–200)
HDL: 72.7 mg/dL (ref >39.00)
LDL Cholesterol: 114 mg/dL — ABNORMAL HIGH (ref 0–99)
NonHDL: 140.12
TRIGLYCERIDES: 130 mg/dL (ref 0.0–149.0)
VLDL: 26 mg/dL (ref 0.0–40.0)

## 2017-02-24 LAB — CBC
HEMATOCRIT: 35.1 % — AB (ref 36.0–46.0)
Hemoglobin: 11.3 g/dL — ABNORMAL LOW (ref 12.0–15.0)
MCHC: 32.2 g/dL (ref 30.0–36.0)
MCV: 80.6 fl (ref 78.0–100.0)
Platelets: 286 10*3/uL (ref 150.0–400.0)
RBC: 4.35 Mil/uL (ref 3.87–5.11)
RDW: 13.6 % (ref 11.5–15.5)
WBC: 6.8 10*3/uL (ref 4.0–10.5)

## 2017-02-24 LAB — COMPREHENSIVE METABOLIC PANEL
ALBUMIN: 4.3 g/dL (ref 3.5–5.2)
ALK PHOS: 85 U/L (ref 39–117)
ALT: 12 U/L (ref 0–35)
AST: 12 U/L (ref 0–37)
BILIRUBIN TOTAL: 0.3 mg/dL (ref 0.2–1.2)
BUN: 20 mg/dL (ref 6–23)
CALCIUM: 10.6 mg/dL — AB (ref 8.4–10.5)
CO2: 28 meq/L (ref 19–32)
Chloride: 100 mEq/L (ref 96–112)
Creatinine, Ser: 0.9 mg/dL (ref 0.40–1.20)
GFR: 80.8 mL/min (ref >60.00)
Glucose, Bld: 83 mg/dL (ref 70–99)
Potassium: 3.6 mEq/L (ref 3.5–5.1)
Sodium: 136 mEq/L (ref 135–145)
TOTAL PROTEIN: 8 g/dL (ref 6.0–8.3)

## 2017-02-24 LAB — HEMOGLOBIN A1C: HEMOGLOBIN A1C: 6 % (ref 4.6–6.5)

## 2017-02-24 LAB — TSH: TSH: 0.78 u[IU]/mL (ref 0.35–4.50)

## 2017-02-24 NOTE — Progress Notes (Signed)
   Subjective:    Patient ID: Sarah Santos, female    DOB: [birth year], 66 y.o.   MRN: [id number]  HPI The patient is a 66 YO female coming in for wellness. No new concerns.   PMH, [facility], social history reviewed and updated.   Review of Systems  Constitutional: Negative.   HENT: Negative.   Eyes: Negative.   Respiratory: Negative for cough, chest tightness and shortness of breath.   Cardiovascular: Negative for chest pain, palpitations and leg swelling.  Gastrointestinal: Negative for abdominal distention, abdominal pain, constipation, diarrhea, nausea and vomiting.  Musculoskeletal: Positive for arthralgias. Negative for back pain, gait problem, joint swelling and myalgias.  Skin: Negative.   Neurological: Negative.   Psychiatric/Behavioral: Negative.       Objective:   Physical Exam  Constitutional: She is oriented to person, place, and time. She appears well-developed and well-nourished.  Overweight  HENT:  Head: Normocephalic and atraumatic.  Eyes: EOM are normal.  Neck: Normal range of motion.  Cardiovascular: Normal rate and regular rhythm.   Pulmonary/Chest: Effort normal and breath sounds normal. No respiratory distress. She has no wheezes. She has no rales.  Abdominal: Soft. Bowel sounds are normal. She exhibits no distension. There is no tenderness. There is no rebound.  Musculoskeletal: She exhibits no edema.  Neurological: She is alert and oriented to person, place, and time. Coordination normal.  Skin: Skin is warm and dry.  Psychiatric: She has a normal mood and affect.   Vitals:   02/24/17 1305  BP: 122/62  Pulse: 76  Temp: 98.3 F (36.8 C)  TempSrc: Oral  SpO2: 99%  Weight: 183 lb (83 kg)  Height: 5\' 1"  (1.549 m)      Assessment & Plan:

## 2017-02-24 NOTE — Assessment & Plan Note (Signed)
Declines hep c and hiv screening. Reminded about pap smear. Reminded about colonoscopy and she will think about it. Advised to get pneumonia shot and she declines. Flu shot up to date. Needs repeat bone density. Counseled on sun safety and mole surveillance. Given screening recommendations.

## 2017-02-24 NOTE — Assessment & Plan Note (Signed)
Taking protonix daily without problems and notices if she misses a dose.

## 2017-02-24 NOTE — Assessment & Plan Note (Signed)
BP at goal on losartan/hctz 100/12.5 mg daily. Checking CMP and adjust as needed.  °

## 2017-02-24 NOTE — Assessment & Plan Note (Signed)
Needs repeat bone density, some aching in her joints which could be related. Checking PTH and calcium levels for stability.

## 2017-02-24 NOTE — Progress Notes (Signed)
Pre visit review using our clinic review tool, if applicable. No additional management support is needed unless otherwise documented below in the visit note. 

## 2017-02-24 NOTE — Patient Instructions (Signed)
We will get the home sleeping test done and the bone density test and you will get a call back about these.  We are doing the labs today and will send you the results.  Think about getting the colonoscopy done and the pneumonia shot next time you come back.  Health Maintenance, Female Adopting a healthy lifestyle and getting preventive care can go a long way to promote health and wellness. Talk with your health care provider about what schedule of regular examinations is right for you. This is a good chance for you to check in with your provider about disease prevention and staying healthy. In between checkups, there are plenty of things you can do on your own. Experts have done a lot of research about which lifestyle changes and preventive measures are most likely to keep you healthy. Ask your health care provider for more information. Weight and diet Eat a healthy diet  Be sure to include plenty of vegetables, fruits, low-fat dairy products, and lean protein.  Do not eat a lot of foods high in solid fats, added sugars, or salt.  Get regular exercise. This is one of the most important things you can do for your health.  Most adults should exercise for at least 150 minutes each week. The exercise should increase your heart rate and make you sweat (moderate-intensity exercise).  Most adults should also do strengthening exercises at least twice a week. This is in addition to the moderate-intensity exercise. Maintain a healthy weight  Body mass index (BMI) is a measurement that can be used to identify possible weight problems. It estimates body fat based on height and weight. Your health care provider can help determine your BMI and help you achieve or maintain a healthy weight.  For females 10 years of age and older:  A BMI below 18.5 is considered underweight.  A BMI of 18.5 to 24.9 is normal.  A BMI of 25 to 29.9 is considered overweight.  A BMI of 30 and above is considered  obese. Watch levels of cholesterol and blood lipids  You should start having your blood tested for lipids and cholesterol at 65 years of age, then have this test every 5 years.  You may need to have your cholesterol levels checked more often if:  Your lipid or cholesterol levels are high.  You are older than 65 years of age.  You are at high risk for heart disease. Cancer screening Lung Cancer  Lung cancer screening is recommended for adults 23-98 years old who are at high risk for lung cancer because of a history of smoking.  A yearly low-dose CT scan of the lungs is recommended for people who:  Currently smoke.  Have quit within the past 15 years.  Have at least a 30-pack-year history of smoking. A pack year is smoking an average of one pack of cigarettes a day for 1 year.  Yearly screening should continue until it has been 15 years since you quit.  Yearly screening should stop if you develop a health problem that would prevent you from having lung cancer treatment. Breast Cancer  Practice breast self-awareness. This means understanding how your breasts normally appear and feel.  It also means doing regular breast self-exams. Let your health care provider know about any changes, no matter how small.  If you are in your 20s or 30s, you should have a clinical breast exam (CBE) by a health care provider every 1-3 years as part of a regular health  exam.  If you are 40 or older, have a CBE every year. Also consider having a breast X-ray (mammogram) every year.  If you have a family history of breast cancer, talk to your health care provider about genetic screening.  If you are at high risk for breast cancer, talk to your health care provider about having an MRI and a mammogram every year.  Breast cancer gene (BRCA) assessment is recommended for women who have family members with BRCA-related cancers. BRCA-related cancers include:  Breast.  Ovarian.  Tubal.  Peritoneal  cancers.  Results of the assessment will determine the need for genetic counseling and BRCA1 and BRCA2 testing. Cervical Cancer  Your health care provider may recommend that you be screened regularly for cancer of the pelvic organs (ovaries, uterus, and vagina). This screening involves a pelvic examination, including checking for microscopic changes to the surface of your cervix (Pap test). You may be encouraged to have this screening done every 3 years, beginning at age 26.  For women ages 74-65, health care providers may recommend pelvic exams and Pap testing every 3 years, or they may recommend the Pap and pelvic exam, combined with testing for human papilloma virus (HPV), every 5 years. Some types of HPV increase your risk of cervical cancer. Testing for HPV may also be done on women of any age with unclear Pap test results.  Other health care providers may not recommend any screening for nonpregnant women who are considered low risk for pelvic cancer and who do not have symptoms. Ask your health care provider if a screening pelvic exam is right for you.  If you have had past treatment for cervical cancer or a condition that could lead to cancer, you need Pap tests and screening for cancer for at least 20 years after your treatment. If Pap tests have been discontinued, your risk factors (such as having a new sexual partner) need to be reassessed to determine if screening should resume. Some women have medical problems that increase the chance of getting cervical cancer. In these cases, your health care provider may recommend more frequent screening and Pap tests. Colorectal Cancer  This type of cancer can be detected and often prevented.  Routine colorectal cancer screening usually begins at 65 years of age and continues through 65 years of age.  Your health care provider may recommend screening at an earlier age if you have risk factors for colon cancer.  Your health care provider may also  recommend using home test kits to check for hidden blood in the stool.  A small camera at the end of a tube can be used to examine your colon directly (sigmoidoscopy or colonoscopy). This is done to check for the earliest forms of colorectal cancer.  Routine screening usually begins at age 19.  Direct examination of the colon should be repeated every 5-10 years through 65 years of age. However, you may need to be screened more often if early forms of precancerous polyps or small growths are found. Skin Cancer  Check your skin from head to toe regularly.  Tell your health care provider about any new moles or changes in moles, especially if there is a change in a mole's shape or color.  Also tell your health care provider if you have a mole that is larger than the size of a pencil eraser.  Always use sunscreen. Apply sunscreen liberally and repeatedly throughout the day.  Protect yourself by wearing long sleeves, pants, a wide-brimmed hat, and  sunglasses whenever you are outside. Heart disease, diabetes, and high blood pressure  High blood pressure causes heart disease and increases the risk of stroke. High blood pressure is more likely to develop in:  People who have blood pressure in the high end of the normal range (130-139/85-89 mm Hg).  People who are overweight or obese.  People who are African American.  If you are 22-29 years of age, have your blood pressure checked every 3-5 years. If you are 63 years of age or older, have your blood pressure checked every year. You should have your blood pressure measured twice-once when you are at a hospital or clinic, and once when you are not at a hospital or clinic. Record the average of the two measurements. To check your blood pressure when you are not at a hospital or clinic, you can use:  An automated blood pressure machine at a pharmacy.  A home blood pressure monitor.  If you are between 81 years and 36 years old, ask your health  care provider if you should take aspirin to prevent strokes.  Have regular diabetes screenings. This involves taking a blood sample to check your fasting blood sugar level.  If you are at a normal weight and have a low risk for diabetes, have this test once every three years after 66 years of age.  If you are overweight and have a high risk for diabetes, consider being tested at a younger age or more often. Preventing infection Hepatitis B  If you have a higher risk for hepatitis B, you should be screened for this virus. You are considered at high risk for hepatitis B if:  You were born in a country where hepatitis B is common. Ask your health care provider which countries are considered high risk.  Your parents were born in a high-risk country, and you have not been immunized against hepatitis B (hepatitis B vaccine).  You have HIV or AIDS.  You use needles to inject street drugs.  You live with someone who has hepatitis B.  You have had sex with someone who has hepatitis B.  You get hemodialysis treatment.  You take certain medicines for conditions, including cancer, organ transplantation, and autoimmune conditions. Hepatitis C  Blood testing is recommended for:  Everyone born from [birth year] through 1964.  Anyone with known risk factors for hepatitis C. Sexually transmitted infections (STIs)  You should be screened for sexually transmitted infections (STIs) including gonorrhea and chlamydia if:  You are sexually active and are younger than 65 years of age.  You are older than 64 years of age and your health care provider tells you that you are at risk for this type of infection.  Your sexual activity has changed since you were last screened and you are at an increased risk for chlamydia or gonorrhea. Ask your health care provider if you are at risk.  If you do not have HIV, but are at risk, it may be recommended that you take a prescription medicine daily to prevent HIV  infection. This is called pre-exposure prophylaxis (PrEP). You are considered at risk if:  You are sexually active and do not regularly use condoms or know the HIV status of your partner(s).  You take drugs by injection.  You are sexually active with a partner who has HIV. Talk with your health care provider about whether you are at high risk of being infected with HIV. If you choose to begin PrEP, you should first be tested  for HIV. You should then be tested every 3 months for as long as you are taking PrEP. Pregnancy  If you are premenopausal and you may become pregnant, ask your health care provider about preconception counseling.  If you may become pregnant, take 400 to 800 micrograms (mcg) of folic acid every day.  If you want to prevent pregnancy, talk to your health care provider about birth control (contraception). Osteoporosis and menopause  Osteoporosis is a disease in which the bones lose minerals and strength with aging. This can result in serious bone fractures. Your risk for osteoporosis can be identified using a bone density scan.  If you are 41 years of age or older, or if you are at risk for osteoporosis and fractures, ask your health care provider if you should be screened.  Ask your health care provider whether you should take a calcium or vitamin D supplement to lower your risk for osteoporosis.  Menopause may have certain physical symptoms and risks.  Hormone replacement therapy may reduce some of these symptoms and risks. Talk to your health care provider about whether hormone replacement therapy is right for you. Follow these instructions at home:  Schedule regular health, dental, and eye exams.  Stay current with your immunizations.  Do not use any tobacco products including cigarettes, chewing tobacco, or electronic cigarettes.  If you are pregnant, do not drink alcohol.  If you are breastfeeding, limit how much and how often you drink alcohol.  Limit  alcohol intake to no more than 1 drink per day for nonpregnant women. One drink equals 12 ounces of beer, 5 ounces of wine, or 1 ounces of hard liquor.  Do not use street drugs.  Do not share needles.  Ask your health care provider for help if you need support or information about quitting drugs.  Tell your health care provider if you often feel depressed.  Tell your health care provider if you have ever been abused or do not feel safe at home. This information is not intended to replace advice given to you by your health care provider. Make sure you discuss any questions you have with your health care provider. Document Released: 06/27/2011 Document Revised: 05/19/2016 Document Reviewed: 09/15/2015 Elsevier Interactive Patient Education  2017 [employer].

## 2017-02-24 NOTE — Assessment & Plan Note (Signed)
Checking lipid panel and on simvastatin daily without problems.

## 2017-02-27 LAB — PTH, INTACT AND CALCIUM
CALCIUM: 10.6 mg/dL — AB (ref 8.6–10.4)
PTH: 115 pg/mL — AB (ref 14–64)

## 2017-03-16 ENCOUNTER — Encounter: Payer: Self-pay | Admitting: Internal Medicine

## 2017-03-22 DIAGNOSIS — E210 Primary hyperparathyroidism: Secondary | ICD-10-CM | POA: Diagnosis not present

## 2017-03-22 DIAGNOSIS — I10 Essential (primary) hypertension: Secondary | ICD-10-CM | POA: Diagnosis not present

## 2017-03-22 DIAGNOSIS — R0683 Snoring: Secondary | ICD-10-CM | POA: Diagnosis not present

## 2017-04-07 ENCOUNTER — Ambulatory Visit (INDEPENDENT_AMBULATORY_CARE_PROVIDER_SITE_OTHER)
Admission: RE | Admit: 2017-04-07 | Discharge: 2017-04-07 | Disposition: A | Discharge status: Home / Self Care | Payer: 59 | Source: Ambulatory Visit | Attending: Internal Medicine | Admitting: Internal Medicine

## 2017-04-07 DIAGNOSIS — E210 Primary hyperparathyroidism: Secondary | ICD-10-CM | POA: Diagnosis not present

## 2017-04-12 MED FILL — LOSARTAN POTASSIUM 50 MG TA: 50 | 90 days supply | Qty: 90 | Fill #3

## 2017-04-12 MED FILL — LOSARTAN-HCTZ 50-12.5 MG TA: 50-12.5 | 90 days supply | Qty: 90 | Fill #3

## 2017-04-12 MED FILL — SIMVASTATIN 20 MG TABLET: 20 | 90 days supply | Qty: 90 | Fill #1

## 2017-04-13 ENCOUNTER — Telehealth: Payer: Self-pay | Admitting: Internal Medicine

## 2017-04-13 NOTE — Telephone Encounter (Signed)
Order signed, can be faxed.

## 2017-04-13 NOTE — Telephone Encounter (Signed)
Patient is needing CPAP machine with choice of mask and supplies.  Pressure level needs to be 5 to 20 CMH2O.  Code [id number].  Also needs NPI and signature.  Can place order in epic and [facility] can pull since faxes are down.

## 2017-04-25 DIAGNOSIS — G4733 Obstructive sleep apnea (adult) (pediatric): Secondary | ICD-10-CM | POA: Diagnosis not present

## 2017-04-28 ENCOUNTER — Encounter: Payer: Self-pay | Admitting: Internal Medicine

## 2017-04-28 ENCOUNTER — Ambulatory Visit (AMBULATORY_SURGERY_CENTER): Payer: Self-pay | Admitting: *Deleted

## 2017-04-28 VITALS — Ht 62.0 in | Wt 183.2 lb

## 2017-04-28 DIAGNOSIS — Z86010 Personal history of colonic polyps: Secondary | ICD-10-CM

## 2017-04-28 NOTE — Progress Notes (Signed)
Denies allergies to eggs or soy products. Denies complications with sedation or anesthesia. Denies O2 use. Denies use of diet or weight loss medications.  Emmi instructions given for colonoscopy.  

## 2017-05-12 ENCOUNTER — Ambulatory Visit (AMBULATORY_SURGERY_CENTER): Payer: 59 | Admitting: Internal Medicine

## 2017-05-12 ENCOUNTER — Encounter: Payer: Self-pay | Admitting: Internal Medicine

## 2017-05-12 VITALS — BP 136/62 | HR 65 | Temp 97.7°F | Resp 19 | Ht 62.0 in | Wt 183.0 lb

## 2017-05-12 DIAGNOSIS — Z86010 Personal history of colonic polyps: Secondary | ICD-10-CM | POA: Diagnosis present

## 2017-05-12 DIAGNOSIS — G4733 Obstructive sleep apnea (adult) (pediatric): Secondary | ICD-10-CM | POA: Diagnosis not present

## 2017-05-12 DIAGNOSIS — D123 Benign neoplasm of transverse colon: Secondary | ICD-10-CM

## 2017-05-12 DIAGNOSIS — E039 Hypothyroidism, unspecified: Secondary | ICD-10-CM | POA: Diagnosis not present

## 2017-05-12 DIAGNOSIS — K219 Gastro-esophageal reflux disease without esophagitis: Secondary | ICD-10-CM | POA: Diagnosis not present

## 2017-05-12 DIAGNOSIS — I10 Essential (primary) hypertension: Secondary | ICD-10-CM | POA: Diagnosis not present

## 2017-05-12 MED ORDER — SODIUM CHLORIDE 0.9 % IV SOLN: 500.0000 mL | INTRAVENOUS | Status: DC

## 2017-05-12 NOTE — Op Note (Signed)
[facility] Patient Name: Sarah Santos Procedure Date: 05/12/2017 8:25 AM MRN: [id number] Endoscopist: Gatha Mayer , MD Age: 66 Referring MD:  Date of Birth: [birth year] Gender: Female Account #: [id number] Procedure:                Colonoscopy Indications:              Surveillance: Personal history of adenomatous                            polyps on last colonoscopy 5 years ago Medicines:                Propofol per Anesthesia, Monitored Anesthesia Care Procedure:                Pre-Anesthesia Assessment:                           - Prior to the procedure, a History and Physical                            was performed, and patient medications and                            allergies were reviewed. The patient's tolerance of                            previous anesthesia was also reviewed. The risks                            and benefits of the procedure and the sedation                            options and risks were discussed with the patient.                            All questions were answered, and informed consent                            was obtained. Prior Anticoagulants: The patient has                            taken no previous anticoagulant or antiplatelet                            agents. ASA Grade Assessment: II - A patient with                            mild systemic disease. After reviewing the risks                            and benefits, the patient was deemed in                            satisfactory condition to undergo the procedure.  After obtaining informed consent, the colonoscope                            was passed under direct vision. Throughout the                            procedure, the patient's blood pressure, pulse, and                            oxygen saturations were monitored continuously. The                            Model CF-HQ190L [id number]) scope was introduced             through the anus and advanced to the the cecum,                            identified by appendiceal orifice and ileocecal                            valve. The colonoscopy was performed without                            difficulty. The patient tolerated the procedure                            well. The quality of the bowel preparation was                            good. The ileocecal valve, appendiceal orifice, and                            rectum were photographed. The bowel preparation                            used was Miralax. Scope In: 8:42:02 AM Scope Out: 9:00:26 AM Scope Withdrawal Time: 0 hours 11 minutes 56 seconds  Total Procedure Duration: 0 hours 18 minutes 24 seconds  Findings:                 The perianal and digital rectal examinations were                            normal.                           A 5 mm polyp was found in the distal transverse                            colon. The polyp was sessile. The polyp was removed                            with a cold snare. Resection and retrieval were  complete. Verification of patient identification                            for the specimen was done. Estimated blood loss was                            minimal.                           Internal hemorrhoids were found during retroflexion.                           The exam was otherwise without abnormality on                            direct and retroflexion views. Complications:            No immediate complications. Estimated Blood Loss:     Estimated blood loss was minimal. Impression:               - One 5 mm polyp in the distal transverse colon,                            removed with a cold snare. Resected and retrieved.                           - Internal hemorrhoids.                           - The examination was otherwise normal on direct                            and retroflexion views.                           -  Personal history of colonic polyps. 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 The signs and symptoms of potential                            delayed complications were discussed with the                            patient. Return to normal activities tomorrow.                            Written discharge instructions were provided to the                            patient.                           -  Resume previous diet.                           - Continue present medications.                           - Repeat colonoscopy is recommended for                            surveillance. The colonoscopy date will be                            determined after pathology results from today's                            exam become available for review. Gatha Mayer, MD 05/12/2017 9:07:32 AM This report has been signed electronically.

## 2017-05-12 NOTE — Progress Notes (Signed)
Called to room to assist during endoscopic procedure.  Patient ID and intended procedure confirmed with present staff. Received instructions for my participation in the procedure from the performing physician.  

## 2017-05-12 NOTE — Progress Notes (Signed)
Pt's states no medical or surgical changes since previsit or office visit. 

## 2017-05-12 NOTE — Patient Instructions (Addendum)
I removed one tiny polyp today. Should be 5 years before next colonoscopy.  I will let you know pathology results and when to have another routine colonoscopy by mail and/or My Chart.  I appreciate the opportunity to care for you. Gatha Mayer, MD, FACG  YOU HAD AN ENDOSCOPIC PROCEDURE TODAY AT [facility]:   Refer to the procedure report that was given to you for any specific questions about what was found during the examination.  If the procedure report does not answer your questions, please call your gastroenterologist to clarify.  If you requested that your care partner not be given the details of your procedure findings, then the procedure report has been included in a sealed envelope for you to review at your convenience later.  YOU SHOULD EXPECT: Some feelings of bloating in the abdomen. Passage of more gas than usual.  Walking can help get rid of the air that was put into your GI tract during the procedure and reduce the bloating. If you had a lower endoscopy (such as a colonoscopy or flexible sigmoidoscopy) you may notice spotting of blood in your stool or on the toilet paper. If you underwent a bowel prep for your procedure, you may not have a normal bowel movement for a few days.  Please Note:  You might notice some irritation and congestion in your nose or some drainage.  This is from the oxygen used during your procedure.  There is no need for concern and it should clear up in a day or so.  SYMPTOMS TO REPORT IMMEDIATELY:   Following lower endoscopy (colonoscopy or flexible sigmoidoscopy):  Excessive amounts of blood in the stool  Significant tenderness or worsening of abdominal pains  Swelling of the abdomen that is new, acute  Fever of 100F or higher   Following upper endoscopy (EGD)  Vomiting of blood or coffee ground material  New chest pain or pain under the shoulder blades  Painful or persistently difficult swallowing  New shortness of  breath  Fever of 100F or higher  Black, tarry-looking stools  For urgent or emergent issues, a gastroenterologist can be reached at any hour by calling [phone].   DIET:  We do recommend a small meal at first, but then you may proceed to your regular diet.  Drink plenty of fluids but you should avoid alcoholic beverages for 24 hours.  ACTIVITY:  You should plan to take it easy for the rest of today and you should NOT DRIVE or use heavy machinery until tomorrow (because of the sedation medicines used during the test).    FOLLOW UP: Our staff will call the number listed on your records the next business day following your procedure to check on you and address any questions or concerns that you may have regarding the information given to you following your procedure. If we do not reach you, we will leave a message.  However, if you are feeling well and you are not experiencing any problems, there is no need to return our call.  We will assume that you have returned to your regular daily activities without incident.  If any biopsies were taken you will be contacted by phone or by letter within the next 1-3 weeks.  Please call us at [phone] if you have not heard about the biopsies in 3 weeks.    SIGNATURES/CONFIDENTIALITY: You and/or your care partner have signed paperwork which will be entered into your electronic medical record.  These  signatures attest to the fact that that the information above on your After Visit Summary has been reviewed and is understood.  Full responsibility of the confidentiality of this discharge information lies with you and/or your care-partner.  Polyp, hemorrhoid and hemorrhoid banding information given.  Dr. Celesta Aver office to call to schedule hemorrhoid banding.

## 2017-05-12 NOTE — Progress Notes (Signed)
Report to PACU, RN, vss, BBS= Clear.  

## 2017-05-15 ENCOUNTER — Telehealth: Payer: Self-pay

## 2017-05-15 NOTE — Telephone Encounter (Signed)
Left a detailed message to call back and set up a banding appointment with Dr Carlean Purl as they discussed at her colonoscopy.

## 2017-05-15 NOTE — Telephone Encounter (Signed)
-----   Message from Gatha Mayer, MD sent at 05/12/2017  9:14 AM EDT ----- Regarding: banding appt Needs banding appt Just had colonoscopy can call next week

## 2017-05-15 NOTE — Telephone Encounter (Signed)
  Follow up Call-  Call back number 05/12/2017  Post procedure Call Back phone  # [phone]  Permission to leave phone message Yes  Some recent data might be hidden    Pt verbalize she hasn't had a bowel movement yet. Advised to give it 2-3 days. No further concerns at this time. Patient questions:  Do you have a fever, pain , or abdominal swelling? No. Pain Score  0 *  Have you tolerated food without any problems? Yes.    Have you been able to return to your normal activities? Yes.    Do you have any questions about your discharge instructions: Diet   No. Medications  No. Follow up visit  No.  Do you have questions or concerns about your Care? Yes.    Actions: * If pain score is 4 or above: No action needed, pain <4.

## 2017-05-18 ENCOUNTER — Encounter: Payer: Self-pay | Admitting: Internal Medicine

## 2017-05-18 DIAGNOSIS — Z86010 Personal history of colonic polyps: Secondary | ICD-10-CM

## 2017-05-18 NOTE — Progress Notes (Signed)
Diminutive adenoma Recall 2023

## 2017-05-23 NOTE — Telephone Encounter (Signed)
Left another message trying to reach [facility] to help her sit up a banding appointment. I told her when she calls back the girls up front can help her sit this up or she can ask for me.

## 2017-05-26 ENCOUNTER — Ambulatory Visit (INDEPENDENT_AMBULATORY_CARE_PROVIDER_SITE_OTHER): Payer: 59 | Admitting: Internal Medicine

## 2017-05-26 ENCOUNTER — Encounter: Payer: Self-pay | Admitting: Internal Medicine

## 2017-05-26 DIAGNOSIS — G4733 Obstructive sleep apnea (adult) (pediatric): Secondary | ICD-10-CM | POA: Diagnosis not present

## 2017-05-26 DIAGNOSIS — Z9989 Dependence on other enabling machines and devices: Secondary | ICD-10-CM | POA: Diagnosis not present

## 2017-05-26 NOTE — Progress Notes (Signed)
   Subjective:    Patient ID: Sarah Santos, female    DOB: 07/24/1951, 66 y.o.   MRN: [id number]  HPI The patient is a 66 YO female coming in for follow up of sleep apnea machine. She has gotten this in the last 90 days and is using at least 6 nights per week. She is using at least 4-6 hours per night. She is feeling much improved since starting this. She is able to stay awake and alert during the day. No side effects or problems with the CPAP machine.   Review of Systems  Constitutional: Negative.   HENT: Negative.   Eyes: Negative.   Respiratory: Negative.   Cardiovascular: Negative.   Gastrointestinal: Negative.   Neurological: Negative.       Objective:   Physical Exam  Constitutional: She appears well-developed and well-nourished.  HENT:  Head: Normocephalic and atraumatic.  Right Ear: External ear normal.  Left Ear: External ear normal.  Nose: Nose normal.  Mouth/Throat: Oropharynx is clear and moist.  Eyes: EOM are normal. Pupils are equal, round, and reactive to light.  Neck: Normal range of motion. No JVD present.  Cardiovascular: Normal rate and regular rhythm.   Pulmonary/Chest: Effort normal and breath sounds normal.  Abdominal: Soft.  Lymphadenopathy:    She has no cervical adenopathy.   Vitals:   05/26/17 1341  BP: 120/64  Pulse: 87  Resp: 12  Temp: 98.6 F (37 C)  TempSrc: Oral  SpO2: 98%  Weight: 183 lb (83 kg)  Height: 5\' 2"  (1.575 m)      Assessment & Plan:

## 2017-05-26 NOTE — Assessment & Plan Note (Signed)
Doing CPAP at least 4 hours per night and getting good benefit. Will continue.

## 2017-05-26 NOTE — Patient Instructions (Signed)
We do not need labs today.    

## 2017-06-02 NOTE — Telephone Encounter (Signed)
Mailed patient a letter to call us to set up banding appointment.

## 2017-06-23 MED FILL — PANTOPRAZOLE SOD DR 40 MG T: 40 | 90 days supply | Qty: 90 | Fill #3

## 2017-06-25 DIAGNOSIS — G4733 Obstructive sleep apnea (adult) (pediatric): Secondary | ICD-10-CM | POA: Diagnosis not present

## 2017-07-14 ENCOUNTER — Other Ambulatory Visit: Payer: Self-pay | Admitting: Internal Medicine

## 2017-07-14 MED FILL — LOSARTAN-HCTZ 50-12.5 MG TA: 50-12.5 | 90 days supply | Qty: 90 | Fill #0

## 2017-07-14 MED FILL — LOSARTAN POTASSIUM 50 MG TA: 50 | 90 days supply | Qty: 90 | Fill #0

## 2017-07-26 DIAGNOSIS — G4733 Obstructive sleep apnea (adult) (pediatric): Secondary | ICD-10-CM | POA: Diagnosis not present

## 2017-07-28 ENCOUNTER — Encounter: Payer: 59 | Admitting: Internal Medicine

## 2017-08-26 DIAGNOSIS — G4733 Obstructive sleep apnea (adult) (pediatric): Secondary | ICD-10-CM | POA: Diagnosis not present

## 2017-09-01 ENCOUNTER — Ambulatory Visit (INDEPENDENT_AMBULATORY_CARE_PROVIDER_SITE_OTHER): Payer: 59 | Admitting: Nurse Practitioner

## 2017-09-01 ENCOUNTER — Encounter: Payer: Self-pay | Admitting: Nurse Practitioner

## 2017-09-01 VITALS — BP 118/60 | HR 103 | Temp 98.5°F | Ht 62.0 in | Wt 183.0 lb

## 2017-09-01 DIAGNOSIS — J209 Acute bronchitis, unspecified: Secondary | ICD-10-CM

## 2017-09-01 DIAGNOSIS — J0140 Acute pansinusitis, unspecified: Secondary | ICD-10-CM

## 2017-09-01 MED ORDER — AMOXICILLIN-POT CLAVULANATE 875-125 MG PO TABS: 1.0000 | ORAL_TABLET | Freq: Two times a day (BID) | ORAL | 0 refills | Status: DC

## 2017-09-01 MED ORDER — BENZONATATE 100 MG PO CAPS: 100.0000 mg | ORAL_CAPSULE | Freq: Three times a day (TID) | ORAL | 0 refills | Status: DC | PRN

## 2017-09-01 MED ORDER — GUAIFENESIN-DM 100-10 MG/5ML PO SYRP: 5.0000 mL | ORAL_SOLUTION | ORAL | 0 refills | Status: DC | PRN

## 2017-09-01 MED ORDER — LORATADINE 10 MG PO TABS: 10.0000 mg | ORAL_TABLET | Freq: Every day | ORAL | 0 refills | Status: DC

## 2017-09-01 MED ORDER — ALBUTEROL SULFATE HFA 108 (90 BASE) MCG/ACT IN AERS: 2.0000 | INHALATION_SPRAY | Freq: Four times a day (QID) | RESPIRATORY_TRACT | 0 refills | Status: DC | PRN

## 2017-09-01 MED FILL — BENZONATATE 100 MG CAP: 100 | 7 days supply | Qty: 20 | Fill #0

## 2017-09-01 NOTE — Patient Instructions (Signed)
URI Instructions: Encourage adequate oral hydration.  Avoid decongestants if you have high blood pressure. Use" Delsym" or" Robitussin" cough syrup varietis for cough.  You can use plain "Tylenol" or "Advi"l for fever, chills and achyness.

## 2017-09-01 NOTE — Progress Notes (Signed)
Subjective:  Patient ID: Sarah Santos, female    DOB: January 20, 1951  Age: 66 y.o. MRN: [id number]  CC: Cough (burning in throat and chest,headache,congestion,coughing,sneezing,ears pain/ going on for 1 wk. took claritin OTC)   Cough  This is a new problem. The current episode started 1 to 4 weeks ago. The problem has been gradually worsening. The cough is non-productive. Associated symptoms include chest pain, chills, myalgias, nasal congestion, postnasal drip, rhinorrhea, shortness of breath and wheezing. Pertinent negatives include no fever. The symptoms are aggravated by lying down and cold air. She has tried OTC cough suppressant for the symptoms. The treatment provided mild relief.    Outpatient Medications Prior to Visit  Medication Sig Dispense Refill  . losartan (COZAAR) 50 MG tablet TAKE 1 TABLET BY MOUTH ONCE DAILY 90 tablet 3  . losartan-hydrochlorothiazide (HYZAAR) 50-12.5 MG tablet TAKE 1 TABLET BY MOUTH DAILY. 90 tablet 1  . pantoprazole (PROTONIX) 40 MG tablet Take 1 tablet (40 mg total) by mouth daily. 90 tablet 3  . simvastatin (ZOCOR) 20 MG tablet Take 1 tablet (20 mg total) by mouth daily at 6 PM. 90 tablet 3   No facility-administered medications prior to visit.     ROS See HPI  Objective:  BP 118/60   Pulse (!) 103   Temp 98.5 F (36.9 C)   Ht 5\' 2"  (1.575 m)   Wt 183 lb (83 kg)   SpO2 99%   BMI 33.47 kg/m   BP Readings from Last 3 Encounters:  09/01/17 118/60  05/26/17 120/64  05/12/17 136/62    Wt Readings from Last 3 Encounters:  09/01/17 183 lb (83 kg)  05/26/17 183 lb (83 kg)  05/12/17 183 lb (83 kg)    Physical Exam  Constitutional: She is oriented to person, place, and time.  HENT:  Right Ear: Tympanic membrane, external ear and ear canal normal.  Left Ear: Tympanic membrane, external ear and ear canal normal.  Nose: Mucosal edema and rhinorrhea present. Right sinus exhibits no maxillary sinus tenderness and no frontal sinus  tenderness. Left sinus exhibits no maxillary sinus tenderness and no frontal sinus tenderness.  Mouth/Throat: Uvula is midline. No trismus in the jaw. Posterior oropharyngeal erythema present. No oropharyngeal exudate.  Eyes: No scleral icterus.  Neck: Normal range of motion. Neck supple.  Cardiovascular: Normal rate and normal heart sounds.   Pulmonary/Chest: Effort normal and breath sounds normal.  Musculoskeletal: She exhibits no edema.  Lymphadenopathy:    She has no cervical adenopathy.  Neurological: She is alert and oriented to person, place, and time.  Vitals reviewed.   Lab Results  Component Value Date   WBC 6.8 02/24/2017   HGB 11.3 (L) 02/24/2017   HCT 35.1 (L) 02/24/2017   PLT 286.0 02/24/2017   GLUCOSE 83 02/24/2017   CHOL 213 (H) 02/24/2017   TRIG 130.0 02/24/2017   HDL 72.70 02/24/2017   LDLDIRECT 156.9 02/01/2012   LDLCALC 114 (H) 02/24/2017   ALT 12 02/24/2017   AST 12 02/24/2017   NA 136 02/24/2017   K 3.6 02/24/2017   CL 100 02/24/2017   CREATININE 0.90 02/24/2017   BUN 20 02/24/2017   CO2 28 02/24/2017   TSH 0.78 02/24/2017   HGBA1C 6.0 02/24/2017    Dg Bone Density  Result Date: 04/10/2017 Date of study: 04/07/2017 Exam: DUAL X-RAY ABSORPTIOMETRY (DXA) FOR BONE MINERAL DENSITY (BMD) Instrument: [employer] Requesting Provider: PCP Indication: primary hyperparathyroidism Comparison: 02/25/3011 Clinical data: Pt is a 66 y.o.  female without previous history of fracture. Results:  Lumbar spine (L1-L4) Femoral neck (FN) 33% distal radius T-score -0.3 RFN: -1.3 LFN: -1.1 -0.5 Change in BMD from previous DXA test (%) -2.3% -5.2%* n/a (*) statistically significant Assessment: the BMD is low according to the [facility] classification for osteoporosis (see below). Fracture risk: moderate FRAX score: 10 year major osteoporotic risk: 3.5%. 10 year hip fracture risk: 0.3%. These are under the thresholds for treatment of 20% and 3%, respectively. Comments: the  technical quality of the study is good. Evaluation for secondary causes should be considered if clinically indicated. Recommend optimizing calcium (1200 mg/day) and vitamin D (800 IU/day) intake. Followup: Repeat BMD is appropriate after 2 years. WHO criteria for diagnosis of osteoporosis in postmenopausal women and in men 110 y/o or older: - normal: T-score -1.0 to + 1.0 - osteopenia/low bone density: T-score between -2.5 and -1.0 - osteoporosis: T-score below -2.5 - severe osteoporosis: T-score below -2.5 with history of fragility fracture Note: although not part of the WHO classification, the presence of a fragility fracture, regardless of the T-score, should be considered diagnostic of osteoporosis, provided other causes for the fracture have been excluded. Treatment: The National Osteoporosis Foundation recommends that treatment be considered in postmenopausal women and men age 75 or older with: 1. Hip or vertebral (clinical or morphometric) fracture 2. T-score of - 2.5 or lower at the spine or hip 3. 10-year fracture probability by FRAX of at least 20% for a major osteoporotic fracture and 3% for a hip fracture Philemon Kingdom, MD [facility]    Assessment & Plan:   Sarah Santos was seen today for cough.  Diagnoses and all orders for this visit:  Acute non-recurrent pansinusitis -     guaiFENesin-dextromethorphan (ROBITUSSIN DM) 100-10 MG/5ML syrup; Take 5 mLs by mouth every 4 (four) hours as needed for cough. -     benzonatate (TESSALON) 100 MG capsule; Take 1 capsule (100 mg total) by mouth 3 (three) times daily as needed for cough. -     amoxicillin-clavulanate (AUGMENTIN) 875-125 MG tablet; Take 1 tablet by mouth 2 (two) times daily. -     loratadine (CLARITIN) 10 MG tablet; Take 1 tablet (10 mg total) by mouth daily.  Acute bronchitis, unspecified organism -     guaiFENesin-dextromethorphan (ROBITUSSIN DM) 100-10 MG/5ML syrup; Take 5 mLs by mouth every 4 (four) hours as needed for  cough. -     benzonatate (TESSALON) 100 MG capsule; Take 1 capsule (100 mg total) by mouth 3 (three) times daily as needed for cough. -     amoxicillin-clavulanate (AUGMENTIN) 875-125 MG tablet; Take 1 tablet by mouth 2 (two) times daily. -     loratadine (CLARITIN) 10 MG tablet; Take 1 tablet (10 mg total) by mouth daily. -     albuterol (PROVENTIL HFA;VENTOLIN HFA) 108 (90 Base) MCG/ACT inhaler; Inhale 2 puffs into the lungs every 6 (six) hours as needed for wheezing or shortness of breath.   I am having Sarah Santos start on guaiFENesin-dextromethorphan, benzonatate, amoxicillin-clavulanate, loratadine, and albuterol. I am also having her maintain her simvastatin, pantoprazole, losartan, and losartan-hydrochlorothiazide.  Meds ordered this encounter  Medications  . guaiFENesin-dextromethorphan (ROBITUSSIN DM) 100-10 MG/5ML syrup    Sig: Take 5 mLs by mouth every 4 (four) hours as needed for cough.    Dispense:  118 mL    Refill:  0    Order Specific Question:   Supervising Provider    Answer:   Cassandria Anger [[id number]]  .  benzonatate (TESSALON) 100 MG capsule    Sig: Take 1 capsule (100 mg total) by mouth 3 (three) times daily as needed for cough.    Dispense:  20 capsule    Refill:  0    Order Specific Question:   Supervising Provider    Answer:   Cassandria Anger [[id number]]  . amoxicillin-clavulanate (AUGMENTIN) 875-125 MG tablet    Sig: Take 1 tablet by mouth 2 (two) times daily.    Dispense:  14 tablet    Refill:  0    Order Specific Question:   Supervising Provider    Answer:   Cassandria Anger [[id number]]  . loratadine (CLARITIN) 10 MG tablet    Sig: Take 1 tablet (10 mg total) by mouth daily.    Dispense:  30 tablet    Refill:  0    Order Specific Question:   Supervising Provider    Answer:   Cassandria Anger [[id number]]  . albuterol (PROVENTIL HFA;VENTOLIN HFA) 108 (90 Base) MCG/ACT inhaler    Sig: Inhale 2 puffs into the lungs every 6 (six) hours as needed for  wheezing or shortness of breath.    Dispense:  1 Inhaler    Refill:  0    Order Specific Question:   Supervising Provider    Answer:   Cassandria Anger [[id number]]    Follow-up: No Follow-up on file.  Wilfred Lacy, NP

## 2017-09-19 ENCOUNTER — Other Ambulatory Visit: Payer: Self-pay | Admitting: Internal Medicine

## 2017-09-19 MED FILL — PANTOPRAZOLE SOD DR 40 MG T: 40 | 90 days supply | Qty: 90 | Fill #0

## 2017-09-25 DIAGNOSIS — G4733 Obstructive sleep apnea (adult) (pediatric): Secondary | ICD-10-CM | POA: Diagnosis not present

## 2017-10-06 MED FILL — LOSARTAN POTASSIUM 50 MG TA: 50 | 90 days supply | Qty: 90 | Fill #1

## 2017-10-06 MED FILL — LOSARTAN-HCTZ 50-12.5 MG TA: 50-12.5 | 90 days supply | Qty: 90 | Fill #1

## 2017-10-26 DIAGNOSIS — G4733 Obstructive sleep apnea (adult) (pediatric): Secondary | ICD-10-CM | POA: Diagnosis not present

## 2017-10-27 ENCOUNTER — Ambulatory Visit (INDEPENDENT_AMBULATORY_CARE_PROVIDER_SITE_OTHER): Payer: 59 | Admitting: Obstetrics & Gynecology

## 2017-10-27 ENCOUNTER — Encounter: Payer: Self-pay | Admitting: Obstetrics & Gynecology

## 2017-10-27 VITALS — BP 112/68 | Ht 61.0 in | Wt 187.8 lb

## 2017-10-27 DIAGNOSIS — Z780 Asymptomatic menopausal state: Secondary | ICD-10-CM

## 2017-10-27 DIAGNOSIS — Z01419 Encounter for gynecological examination (general) (routine) without abnormal findings: Secondary | ICD-10-CM | POA: Diagnosis not present

## 2017-10-27 NOTE — Progress Notes (Signed)
Sarah Santos 08/11/1951 [id number]   History:    66 y.o. [id number]  RP:  New patient presenting for annual gyn exam   HPI:  Menopause.  No HRT.  No pelvic pain.  Not sexually active currently.  Breasts wnl.  Mictions/BMs wnl.  BMI 35.48.  Past medical history,surgical history, family history and social history were all reviewed and documented in the EPIC chart.  Gynecologic History No LMP recorded. Patient is postmenopausal. Contraception: post menopausal status Last Pap: 2016 or 2017. Results were: normal Last mammogram: 08/2015. Results were: Neg Bone density 03/2017:  Osteopenia T-Score -1.3 at Femoral neck [place] 04/2017  Obstetric History OB History  Gravida Para Term Preterm AB Living  3 2     1 2   SAB TAB Ectopic Multiple Live Births               # Outcome Date GA Lbr Len/2nd Weight Sex Delivery Anes PTL Lv  3 AB           2 Para           1 Para                ROS: A ROS was performed and pertinent positives and negatives are included in the history.  GENERAL: No fevers or chills. HEENT: No change in vision, no earache, sore throat or sinus congestion. NECK: No pain or stiffness. CARDIOVASCULAR: No chest pain or pressure. No palpitations. PULMONARY: No shortness of breath, cough or wheeze. GASTROINTESTINAL: No abdominal pain, nausea, vomiting or diarrhea, melena or bright red blood per rectum. GENITOURINARY: No urinary frequency, urgency, hesitancy or dysuria. MUSCULOSKELETAL: No joint or muscle pain, no back pain, no recent trauma. DERMATOLOGIC: No rash, no itching, no lesions. ENDOCRINE: No polyuria, polydipsia, no heat or cold intolerance. No recent change in weight. HEMATOLOGICAL: No anemia or easy bruising or bleeding. NEUROLOGIC: No headache, seizures, numbness, tingling or weakness. PSYCHIATRIC: No depression, no loss of interest in normal activity or change in sleep pattern.     Exam:   BP 112/68   Ht 5\' 1"  (1.549 m)   Wt 187 lb 12.8 oz (85.2  kg)   BMI 35.48 kg/m   Body mass index is 35.48 kg/m.  General appearance : Well developed well nourished female. No acute distress HEENT: Eyes: no retinal hemorrhage or exudates,  Neck supple, trachea midline, no carotid bruits, no thyroidmegaly Lungs: Clear to auscultation, no rhonchi or wheezes, or rib retractions  Heart: Regular rate and rhythm, no murmurs or gallops Breast:Examined in sitting and supine position were symmetrical in appearance, no palpable masses or tenderness,  no skin retraction, no nipple inversion, no nipple discharge, no skin discoloration, no axillary or supraclavicular lymphadenopathy Abdomen: no palpable masses or tenderness, no rebound or guarding Extremities: no edema or skin discoloration or tenderness  Pelvic: Vulva normal  Bartholin, Urethra, Skene Glands: Within normal limits             Vagina: No gross lesions or discharge  Cervix: No gross lesions or discharge.  Pap reflex done.  Uterus  AV, normal size, shape and consistency, non-tender and mobile  Adnexa  Without masses or tenderness  Anus and perineum  normal    Assessment/Plan:  66 y.o. female for annual exam   1. Pap smear, as part of routine gynecological examination Normal gynecologic exam.  Pap reflex done.  Breasts normal.  Will schedule screening mammography.  Labs with family doctor. - Pap IG w/ reflex to  HPV when ASC-U  2. Menopause present No hormone replacement therapy.  Menopause well tolerated.  No postmenopausal bleeding.  Vitamin D supplements.  Calcium and food.  Weightbearing physical activity.  Recent bone density showing osteopenia.  Will repeat bone density in 2 years.  Princess Bruins MD, 2:31 PM 10/27/2017

## 2017-10-29 NOTE — Patient Instructions (Signed)
1. Pap smear, as part of routine gynecological examination Normal gynecologic exam.  Pap reflex done.  Breasts normal.  Will schedule screening mammography.  Labs with family doctor. - Pap IG w/ reflex to HPV when [facility]  2. Menopause present No hormone replacement therapy.  Menopause well tolerated.  No postmenopausal bleeding.  Vitamin D supplements.  Calcium and food.  Weightbearing physical activity.  Recent bone density showing osteopenia.  Will repeat bone density in 2 years.   Sarah Santos, it was a pleasure meeting you today!  I will inform you of your results as soon as available.   Health Maintenance for Postmenopausal Women Menopause is a normal process in which your reproductive ability comes to an end. This process happens gradually over a span of months to years, usually between the ages of 35 and 64. Menopause is complete when you have missed 12 consecutive menstrual periods. It is important to talk with your health care provider about some of the most common conditions that affect postmenopausal women, such as heart disease, cancer, and bone loss (osteoporosis). Adopting a healthy lifestyle and getting preventive care can help to promote your health and wellness. Those actions can also lower your chances of developing some of these common conditions. What should I know about menopause? During menopause, you may experience a number of symptoms, such as:  Moderate-to-severe hot flashes.  Night sweats.  Decrease in sex drive.  Mood swings.  Headaches.  Tiredness.  Irritability.  Memory problems.  Insomnia.  Choosing to treat or not to treat menopausal changes is an individual decision that you make with your health care provider. What should I know about hormone replacement therapy and supplements? Hormone therapy products are effective for treating symptoms that are associated with menopause, such as hot flashes and night sweats. Hormone replacement carries certain risks,  especially as you become older. If you are thinking about using estrogen or estrogen with progestin treatments, discuss the benefits and risks with your health care provider. What should I know about heart disease and stroke? Heart disease, heart attack, and stroke become more likely as you age. This may be due, in part, to the hormonal changes that your body experiences during menopause. These can affect how your body processes dietary fats, triglycerides, and cholesterol. Heart attack and stroke are both medical emergencies. There are many things that you can do to help prevent heart disease and stroke:  Have your blood pressure checked at least every 1-2 years. High blood pressure causes heart disease and increases the risk of stroke.  If you are 44-71 years old, ask your health care provider if you should take aspirin to prevent a heart attack or a stroke.  Do not use any tobacco products, including cigarettes, chewing tobacco, or electronic cigarettes. If you need help quitting, ask your health care provider.  It is important to eat a healthy diet and maintain a healthy weight. ? Be sure to include plenty of vegetables, fruits, low-fat dairy products, and lean protein. ? Avoid eating foods that are high in solid fats, added sugars, or salt (sodium).  Get regular exercise. This is one of the most important things that you can do for your health. ? Try to exercise for at least 150 minutes each week. The type of exercise that you do should increase your heart rate and make you sweat. This is known as moderate-intensity exercise. ? Try to do strengthening exercises at least twice each week. Do these in addition to the moderate-intensity exercise.  Know your numbers.Ask your health care provider to check your cholesterol and your blood glucose. Continue to have your blood tested as directed by your health care provider.  What should I know about cancer screening? There are several types of  cancer. Take the following steps to reduce your risk and to catch any cancer development as early as possible. Breast Cancer  Practice breast self-awareness. ? This means understanding how your breasts normally appear and feel. ? It also means doing regular breast self-exams. Let your health care provider know about any changes, no matter how small.  If you are 67 or older, have a clinician do a breast exam (clinical breast exam or CBE) every year. Depending on your age, family history, and medical history, it may be recommended that you also have a yearly breast X-ray (mammogram).  If you have a family history of breast cancer, talk with your health care provider about genetic screening.  If you are at high risk for breast cancer, talk with your health care provider about having an MRI and a mammogram every year.  Breast cancer (BRCA) gene test is recommended for women who have family members with BRCA-related cancers. Results of the assessment will determine the need for genetic counseling and BRCA1 and for BRCA2 testing. BRCA-related cancers include these types: ? Breast. This occurs in males or females. ? Ovarian. ? Tubal. This may also be called fallopian tube cancer. ? Cancer of the abdominal or pelvic lining (peritoneal cancer). ? Prostate. ? Pancreatic.  Cervical, Uterine, and Ovarian Cancer Your health care provider may recommend that you be screened regularly for cancer of the pelvic organs. These include your ovaries, uterus, and vagina. This screening involves a pelvic exam, which includes checking for microscopic changes to the surface of your cervix (Pap test).  For women ages 21-65, health care providers may recommend a pelvic exam and a Pap test every three years. For women ages 57-65, they may recommend the Pap test and pelvic exam, combined with testing for human papilloma virus (HPV), every five years. Some types of HPV increase your risk of cervical cancer. Testing for HPV  may also be done on women of any age who have unclear Pap test results.  Other health care providers may not recommend any screening for nonpregnant women who are considered low risk for pelvic cancer and have no symptoms. Ask your health care provider if a screening pelvic exam is right for you.  If you have had past treatment for cervical cancer or a condition that could lead to cancer, you need Pap tests and screening for cancer for at least 20 years after your treatment. If Pap tests have been discontinued for you, your risk factors (such as having a new sexual partner) need to be reassessed to determine if you should start having screenings again. Some women have medical problems that increase the chance of getting cervical cancer. In these cases, your health care provider may recommend that you have screening and Pap tests more often.  If you have a family history of uterine cancer or ovarian cancer, talk with your health care provider about genetic screening.  If you have vaginal bleeding after reaching menopause, tell your health care provider.  There are currently no reliable tests available to screen for ovarian cancer.  Lung Cancer Lung cancer screening is recommended for adults 22-99 years old who are at high risk for lung cancer because of a history of smoking. A yearly low-dose CT scan of the  lungs is recommended if you:  Currently smoke.  Have a history of at least 30 pack-years of smoking and you currently smoke or have quit within the past 15 years. A pack-year is smoking an average of one pack of cigarettes per day for one year.  Yearly screening should:  Continue until it has been 15 years since you quit.  Stop if you develop a health problem that would prevent you from having lung cancer treatment.  Colorectal Cancer  This type of cancer can be detected and can often be prevented.  Routine colorectal cancer screening usually begins at age 25 and continues through age  72.  If you have risk factors for colon cancer, your health care provider may recommend that you be screened at an earlier age.  If you have a family history of colorectal cancer, talk with your health care provider about genetic screening.  Your health care provider may also recommend using home test kits to check for hidden blood in your stool.  A small camera at the end of a tube can be used to examine your colon directly (sigmoidoscopy or colonoscopy). This is done to check for the earliest forms of colorectal cancer.  Direct examination of the colon should be repeated every 5-10 years until age 30. However, if early forms of precancerous polyps or small growths are found or if you have a family history or genetic risk for colorectal cancer, you may need to be screened more often.  Skin Cancer  Check your skin from head to toe regularly.  Monitor any moles. Be sure to tell your health care provider: ? About any new moles or changes in moles, especially if there is a change in a mole's shape or color. ? If you have a mole that is larger than the size of a pencil eraser.  If any of your family members has a history of skin cancer, especially at a young age, talk with your health care provider about genetic screening.  Always use sunscreen. Apply sunscreen liberally and repeatedly throughout the day.  Whenever you are outside, protect yourself by wearing long sleeves, pants, a wide-brimmed hat, and sunglasses.  What should I know about osteoporosis? Osteoporosis is a condition in which bone destruction happens more quickly than new bone creation. After menopause, you may be at an increased risk for osteoporosis. To help prevent osteoporosis or the bone fractures that can happen because of osteoporosis, the following is recommended:  If you are 109-64 years old, get at least 1,000 mg of calcium and at least 600 mg of vitamin D per day.  If you are older than age 41 but younger than age  32, get at least 1,200 mg of calcium and at least 600 mg of vitamin D per day.  If you are older than age 60, get at least 1,200 mg of calcium and at least 800 mg of vitamin D per day.  Smoking and excessive alcohol intake increase the risk of osteoporosis. Eat foods that are rich in calcium and vitamin D, and do weight-bearing exercises several times each week as directed by your health care provider. What should I know about how menopause affects my mental health? Depression may occur at any age, but it is more common as you become older. Common symptoms of depression include:  Low or sad mood.  Changes in sleep patterns.  Changes in appetite or eating patterns.  Feeling an overall lack of motivation or enjoyment of activities that you previously  enjoyed.  Frequent crying spells.  Talk with your health care provider if you think that you are experiencing depression. What should I know about immunizations? It is important that you get and maintain your immunizations. These include:  Tetanus, diphtheria, and pertussis (Tdap) booster vaccine.  Influenza every year before the flu season begins.  Pneumonia vaccine.  Shingles vaccine.  Your health care provider may also recommend other immunizations. This information is not intended to replace advice given to you by your health care provider. Make sure you discuss any questions you have with your health care provider. Document Released: 02/03/2006 Document Revised: 07/01/2016 Document Reviewed: 09/15/2015 Elsevier Interactive Patient Education  2018 [employer].

## 2017-10-30 DIAGNOSIS — G4733 Obstructive sleep apnea (adult) (pediatric): Secondary | ICD-10-CM | POA: Diagnosis not present

## 2017-10-31 LAB — PAP IG W/ RFLX HPV ASCU

## 2017-11-03 ENCOUNTER — Other Ambulatory Visit: Payer: Self-pay | Admitting: Obstetrics and Gynecology

## 2017-11-03 DIAGNOSIS — Z1231 Encounter for screening mammogram for malignant neoplasm of breast: Secondary | ICD-10-CM

## 2017-11-25 DIAGNOSIS — G4733 Obstructive sleep apnea (adult) (pediatric): Secondary | ICD-10-CM | POA: Diagnosis not present

## 2017-12-01 ENCOUNTER — Ambulatory Visit
Admission: RE | Admit: 2017-12-01 | Discharge: 2017-12-01 | Disposition: A | Discharge status: Home / Self Care | Payer: 59 | Source: Ambulatory Visit | Attending: Obstetrics and Gynecology | Admitting: Obstetrics and Gynecology

## 2017-12-01 DIAGNOSIS — Z1231 Encounter for screening mammogram for malignant neoplasm of breast: Secondary | ICD-10-CM

## 2017-12-14 DIAGNOSIS — G4733 Obstructive sleep apnea (adult) (pediatric): Secondary | ICD-10-CM | POA: Diagnosis not present

## 2017-12-26 DIAGNOSIS — G4733 Obstructive sleep apnea (adult) (pediatric): Secondary | ICD-10-CM | POA: Diagnosis not present

## 2018-01-01 MED FILL — PANTOPRAZOLE SOD DR 40 MG T: 40 | 90 days supply | Qty: 90 | Fill #1

## 2018-01-01 MED FILL — LOSARTAN POTASSIUM 50 MG TA: 50 | 90 days supply | Qty: 90 | Fill #2

## 2018-01-11 ENCOUNTER — Other Ambulatory Visit: Payer: Self-pay

## 2018-01-11 ENCOUNTER — Telehealth: Payer: Self-pay | Admitting: Internal Medicine

## 2018-01-11 MED ORDER — LOSARTAN POTASSIUM 50 MG PO TABS: 50.0000 mg | ORAL_TABLET | Freq: Every day | ORAL | 0 refills | Status: DC

## 2018-01-11 NOTE — Telephone Encounter (Signed)
Copied from [place] [phone]. Topic: Quick Communication - Rx Refill/Question >> Jan 11, 2018  9:01 AM Synthia Innocent wrote: Medication: losartan (COZAAR) 50 MG tablet    Has the patient contacted their pharmacy? Yes.     (Agent: If no, request that the patient contact the pharmacy for the refill.)   Preferred Pharmacy (with phone number or street name): [facility]   Agent: Please be advised that RX refills may take up to 3 business days. We ask that you follow-up with your pharmacy.

## 2018-01-12 DIAGNOSIS — H524 Presbyopia: Secondary | ICD-10-CM | POA: Diagnosis not present

## 2018-01-12 DIAGNOSIS — H43813 Vitreous degeneration, bilateral: Secondary | ICD-10-CM | POA: Diagnosis not present

## 2018-01-12 DIAGNOSIS — H0100A Unspecified blepharitis right eye, upper and lower eyelids: Secondary | ICD-10-CM | POA: Diagnosis not present

## 2018-01-12 DIAGNOSIS — H2513 Age-related nuclear cataract, bilateral: Secondary | ICD-10-CM | POA: Diagnosis not present

## 2018-01-23 ENCOUNTER — Telehealth: Payer: Self-pay | Admitting: Internal Medicine

## 2018-01-23 ENCOUNTER — Other Ambulatory Visit: Payer: Self-pay

## 2018-01-23 MED ORDER — LOSARTAN POTASSIUM-HCTZ 50-12.5 MG PO TABS: 1.0000 | ORAL_TABLET | Freq: Every day | ORAL | 0 refills | Status: DC

## 2018-01-23 MED FILL — LOSARTAN-HCTZ 50-12.5 MG TA: 50-12.5 | 90 days supply | Qty: 90 | Fill #0

## 2018-01-23 NOTE — Telephone Encounter (Signed)
Rx sent 

## 2018-01-23 NOTE — Telephone Encounter (Signed)
Copied from [place] [id number]. Topic: Quick Communication - Rx Refill/Question >> Jan 23, 2018  4:21 PM Malena Catholic I, [place] wrote: Medication: Hyzaar-50-12.5 my   Has the patient contacted their pharmacy yes    (Agent: If no, request that the patient contact the pharmacy for the refill.Ye   Preferred Pharmacy (with phone number or street name [facility] [phone]   Agent: Please be advised that RX refills may take up to 3 business days. We ask that you follow-up with your pharmacy.

## 2018-01-26 DIAGNOSIS — G4733 Obstructive sleep apnea (adult) (pediatric): Secondary | ICD-10-CM | POA: Diagnosis not present

## 2018-02-14 ENCOUNTER — Other Ambulatory Visit: Payer: Self-pay | Admitting: Internal Medicine

## 2018-02-14 MED FILL — SIMVASTATIN 20 MG TABLET: 20 | 90 days supply | Qty: 90 | Fill #0

## 2018-03-02 ENCOUNTER — Encounter: Payer: Self-pay | Admitting: Internal Medicine

## 2018-03-02 ENCOUNTER — Ambulatory Visit (INDEPENDENT_AMBULATORY_CARE_PROVIDER_SITE_OTHER): Payer: 59 | Admitting: Internal Medicine

## 2018-03-02 VITALS — BP 118/60 | HR 82 | Temp 97.8°F | Ht 61.0 in | Wt 193.0 lb

## 2018-03-02 DIAGNOSIS — I10 Essential (primary) hypertension: Secondary | ICD-10-CM | POA: Diagnosis not present

## 2018-03-02 DIAGNOSIS — E785 Hyperlipidemia, unspecified: Secondary | ICD-10-CM

## 2018-03-02 DIAGNOSIS — G4733 Obstructive sleep apnea (adult) (pediatric): Secondary | ICD-10-CM | POA: Diagnosis not present

## 2018-03-02 DIAGNOSIS — K219 Gastro-esophageal reflux disease without esophagitis: Secondary | ICD-10-CM

## 2018-03-02 DIAGNOSIS — Z9989 Dependence on other enabling machines and devices: Secondary | ICD-10-CM

## 2018-03-02 DIAGNOSIS — Z0000 Encounter for general adult medical examination without abnormal findings: Secondary | ICD-10-CM | POA: Diagnosis not present

## 2018-03-02 NOTE — Progress Notes (Signed)
   Subjective:    Patient ID: Sarah Santos, female    DOB: 05-Aug-1951, 67 y.o.   MRN: [id number]  HPI The patient is a 67 YO female coming in for physical.   PMH, [place], social history reviewed and updated  Review of Systems  Constitutional: Negative.   HENT: Negative.   Eyes: Negative.   Respiratory: Negative for cough, chest tightness and shortness of breath.   Cardiovascular: Negative for chest pain, palpitations and leg swelling.  Gastrointestinal: Negative for abdominal distention, abdominal pain, constipation, diarrhea, nausea and vomiting.  Musculoskeletal: Negative.   Skin: Negative.   Neurological: Negative.   Psychiatric/Behavioral: Negative.       Objective:   Physical Exam  Constitutional: She is oriented to person, place, and time. She appears well-developed and well-nourished.  HENT:  Head: Normocephalic and atraumatic.  Eyes: EOM are normal.  Neck: Normal range of motion.  Cardiovascular: Normal rate and regular rhythm.  Pulmonary/Chest: Effort normal and breath sounds normal. No respiratory distress. She has no wheezes. She has no rales.  Abdominal: Soft. Bowel sounds are normal. She exhibits no distension. There is no tenderness. There is no rebound.  Musculoskeletal: She exhibits no edema.  Neurological: She is alert and oriented to person, place, and time. Coordination normal.  Skin: Skin is warm and dry.  Psychiatric: She has a normal mood and affect.   Vitals:   03/02/18 1314  BP: 118/60  Pulse: 82  Temp: 97.8 F (36.6 C)  TempSrc: Oral  SpO2: 99%  Weight: 193 lb (87.5 kg)  Height: 5\' 1"  (1.549 m)      Assessment & Plan:

## 2018-03-02 NOTE — Patient Instructions (Addendum)
We will check the labs so come anytime to the lab which opens at 7:30 AM.   Plantar Fasciitis Rehab Ask your health care provider which exercises are safe for you. Do exercises exactly as told by your health care provider and adjust them as directed. It is normal to feel mild stretching, pulling, tightness, or discomfort as you do these exercises, but you should stop right away if you feel sudden pain or your pain gets worse. Do not begin these exercises until told by your health care provider. Stretching and range of motion exercises These exercises warm up your muscles and joints and improve the movement and flexibility of your foot. These exercises also help to relieve pain. Exercise A: Plantar fascia stretch  1. Sit with your left / right leg crossed over your opposite knee. 2. Hold your heel with one hand with that thumb near your arch. With your other hand, hold your toes and gently pull them back toward the top of your foot. You should feel a stretch on the bottom of your toes or your foot or both. 3. Hold this stretch for__________ seconds. 4. Slowly release your toes and return to the starting position. Repeat __________ times. Complete this exercise __________ times a day. Exercise B: Gastroc, standing  1. Stand with your hands against a wall. 2. Extend your left / right leg behind you, and bend your front knee slightly. 3. Keeping your heels on the floor and keeping your back knee straight, shift your weight toward the wall without arching your back. You should feel a gentle stretch in your left / right calf. 4. Hold this position for __________ seconds. Repeat __________ times. Complete this exercise __________ times a day. Exercise C: Soleus, standing 1. Stand with your hands against a wall. 2. Extend your left / right leg behind you, and bend your front knee slightly. 3. Keeping your heels on the floor, bend your back knee and slightly shift your weight over the back leg. You  should feel a gentle stretch deep in your calf. 4. Hold this position for __________ seconds. Repeat __________ times. Complete this exercise __________ times a day. Exercise D: Gastrocsoleus, standing 1. Stand with the ball of your left / right foot on a step. The ball of your foot is on the walking surface, right under your toes. 2. Keep your other foot firmly on the same step. 3. Hold onto the wall or a railing for balance. 4. Slowly lift your other foot, allowing your body weight to press your heel down over the edge of the step. You should feel a stretch in your left / right calf. 5. Hold this position for __________ seconds. 6. Return both feet to the step. 7. Repeat this exercise with a slight bend in your left / right knee. Repeat __________ times with your left / right knee straight and __________ times with your left / right knee bent. Complete this exercise __________ times a day. Balance exercise This exercise builds your balance and strength control of your arch to help take pressure off your plantar fascia. Exercise E: Single leg stand 1. Without shoes, stand near a railing or in a doorway. You may hold onto the railing or door frame as needed. 2. Stand on your left / right foot. Keep your big toe down on the floor and try to keep your arch lifted. Do not let your foot roll inward. 3. Hold this position for __________ seconds. 4. If this exercise is too easy,  you can try it with your eyes closed or while standing on a pillow. Repeat __________ times. Complete this exercise __________ times a day. This information is not intended to replace advice given to you by your health care provider. Make sure you discuss any questions you have with your health care provider. Document Released: 12/12/2005 Document Revised: 08/16/2016 Document Reviewed: 10/26/2015 Elsevier Interactive Patient Education  2018 [place] Maintenance, Female Adopting a healthy lifestyle and  getting preventive care can go a long way to promote health and wellness. Talk with your health care provider about what schedule of regular examinations is right for you. This is a good chance for you to check in with your provider about disease prevention and staying healthy. In between checkups, there are plenty of things you can do on your own. Experts have done a lot of research about which lifestyle changes and preventive measures are most likely to keep you healthy. Ask your health care provider for more information. Weight and diet Eat a healthy diet  Be sure to include plenty of vegetables, fruits, low-fat dairy products, and lean protein.  Do not eat a lot of foods high in solid fats, added sugars, or salt.  Get regular exercise. This is one of the most important things you can do for your health. ? Most adults should exercise for at least 150 minutes each week. The exercise should increase your heart rate and make you sweat (moderate-intensity exercise). ? Most adults should also do strengthening exercises at least twice a week. This is in addition to the moderate-intensity exercise.  Maintain a healthy weight  Body mass index (BMI) is a measurement that can be used to identify possible weight problems. It estimates body fat based on height and weight. Your health care provider can help determine your BMI and help you achieve or maintain a healthy weight.  For females 46 years of age and older: ? A BMI below 18.5 is considered underweight. ? A BMI of 18.5 to 24.9 is normal. ? A BMI of 25 to 29.9 is considered overweight. ? A BMI of 30 and above is considered obese.  Watch levels of cholesterol and blood lipids  You should start having your blood tested for lipids and cholesterol at 66 years of age, then have this test every 5 years.  You may need to have your cholesterol levels checked more often if: ? Your lipid or cholesterol levels are high. ? You are older than 66 years of  age. ? You are at high risk for heart disease.  Cancer screening Lung Cancer  Lung cancer screening is recommended for adults 44-17 years old who are at high risk for lung cancer because of a history of smoking.  A yearly low-dose CT scan of the lungs is recommended for people who: ? Currently smoke. ? Have quit within the past 15 years. ? Have at least a 30-pack-year history of smoking. A pack year is smoking an average of one pack of cigarettes a day for 1 year.  Yearly screening should continue until it has been 15 years since you quit.  Yearly screening should stop if you develop a health problem that would prevent you from having lung cancer treatment.  Breast Cancer  Practice breast self-awareness. This means understanding how your breasts normally appear and feel.  It also means doing regular breast self-exams. Let your health care provider know about any changes, no matter how small.  If you are in your  66s or 36s, you should have a clinical breast exam (CBE) by a health care provider every 1-3 years as part of a regular health exam.  If you are 53 or older, have a CBE every year. Also consider having a breast X-ray (mammogram) every year.  If you have a family history of breast cancer, talk to your health care provider about genetic screening.  If you are at high risk for breast cancer, talk to your health care provider about having an MRI and a mammogram every year.  Breast cancer gene (BRCA) assessment is recommended for women who have family members with BRCA-related cancers. BRCA-related cancers include: ? Breast. ? Ovarian. ? Tubal. ? Peritoneal cancers.  Results of the assessment will determine the need for genetic counseling and BRCA1 and BRCA2 testing.  Cervical Cancer Your health care provider may recommend that you be screened regularly for cancer of the pelvic organs (ovaries, uterus, and vagina). This screening involves a pelvic examination, including  checking for microscopic changes to the surface of your cervix (Pap test). You may be encouraged to have this screening done every 3 years, beginning at age 84.  For women ages 74-65, health care providers may recommend pelvic exams and Pap testing every 3 years, or they may recommend the Pap and pelvic exam, combined with testing for human papilloma virus (HPV), every 5 years. Some types of HPV increase your risk of cervical cancer. Testing for HPV may also be done on women of any age with unclear Pap test results.  Other health care providers may not recommend any screening for nonpregnant women who are considered low risk for pelvic cancer and who do not have symptoms. Ask your health care provider if a screening pelvic exam is right for you.  If you have had past treatment for cervical cancer or a condition that could lead to cancer, you need Pap tests and screening for cancer for at least 20 years after your treatment. If Pap tests have been discontinued, your risk factors (such as having a new sexual partner) need to be reassessed to determine if screening should resume. Some women have medical problems that increase the chance of getting cervical cancer. In these cases, your health care provider may recommend more frequent screening and Pap tests.  Colorectal Cancer  This type of cancer can be detected and often prevented.  Routine colorectal cancer screening usually begins at 66 years of age and continues through 66 years of age.  Your health care provider may recommend screening at an earlier age if you have risk factors for colon cancer.  Your health care provider may also recommend using home test kits to check for hidden blood in the stool.  A small camera at the end of a tube can be used to examine your colon directly (sigmoidoscopy or colonoscopy). This is done to check for the earliest forms of colorectal cancer.  Routine screening usually begins at age 73.  Direct examination of  the colon should be repeated every 5-10 years through 66 years of age. However, you may need to be screened more often if early forms of precancerous polyps or small growths are found.  Skin Cancer  Check your skin from head to toe regularly.  Tell your health care provider about any new moles or changes in moles, especially if there is a change in a mole's shape or color.  Also tell your health care provider if you have a mole that is larger than the size of  a pencil eraser.  Always use sunscreen. Apply sunscreen liberally and repeatedly throughout the day.  Protect yourself by wearing long sleeves, pants, a wide-brimmed hat, and sunglasses whenever you are outside.  Heart disease, diabetes, and high blood pressure  High blood pressure causes heart disease and increases the risk of stroke. High blood pressure is more likely to develop in: ? People who have blood pressure in the high end of the normal range (130-139/85-89 mm Hg). ? People who are overweight or obese. ? People who are African American.  If you are 32-30 years of age, have your blood pressure checked every 3-5 years. If you are 59 years of age or older, have your blood pressure checked every year. You should have your blood pressure measured twice-once when you are at a hospital or clinic, and once when you are not at a hospital or clinic. Record the average of the two measurements. To check your blood pressure when you are not at a hospital or clinic, you can use: ? An automated blood pressure machine at a pharmacy. ? A home blood pressure monitor.  If you are between 57 years and 63 years old, ask your health care provider if you should take aspirin to prevent strokes.  Have regular diabetes screenings. This involves taking a blood sample to check your fasting blood sugar level. ? If you are at a normal weight and have a low risk for diabetes, have this test once every three years after 66 years of age. ? If you are  overweight and have a high risk for diabetes, consider being tested at a younger age or more often. Preventing infection Hepatitis B  If you have a higher risk for hepatitis B, you should be screened for this virus. You are considered at high risk for hepatitis B if: ? You were born in a country where hepatitis B is common. Ask your health care provider which countries are considered high risk. ? Your parents were born in a high-risk country, and you have not been immunized against hepatitis B (hepatitis B vaccine). ? You have HIV or AIDS. ? You use needles to inject street drugs. ? You live with someone who has hepatitis B. ? You have had sex with someone who has hepatitis B. ? You get hemodialysis treatment. ? You take certain medicines for conditions, including cancer, organ transplantation, and autoimmune conditions.  Hepatitis C  Blood testing is recommended for: ? Everyone born from [birth year] through 1964. ? Anyone with known risk factors for hepatitis C.  Sexually transmitted infections (STIs)  You should be screened for sexually transmitted infections (STIs) including gonorrhea and chlamydia if: ? You are sexually active and are younger than 66 years of age. ? You are older than 66 years of age and your health care provider tells you that you are at risk for this type of infection. ? Your sexual activity has changed since you were last screened and you are at an increased risk for chlamydia or gonorrhea. Ask your health care provider if you are at risk.  If you do not have HIV, but are at risk, it may be recommended that you take a prescription medicine daily to prevent HIV infection. This is called pre-exposure prophylaxis (PrEP). You are considered at risk if: ? You are sexually active and do not regularly use condoms or know the HIV status of your partner(s). ? You take drugs by injection. ? You are sexually active with a partner who has HIV.  Talk with your health care provider  about whether you are at high risk of being infected with HIV. If you choose to begin PrEP, you should first be tested for HIV. You should then be tested every 3 months for as long as you are taking PrEP. Pregnancy  If you are premenopausal and you may become pregnant, ask your health care provider about preconception counseling.  If you may become pregnant, take 400 to 800 micrograms (mcg) of folic acid every day.  If you want to prevent pregnancy, talk to your health care provider about birth control (contraception). Osteoporosis and menopause  Osteoporosis is a disease in which the bones lose minerals and strength with aging. This can result in serious bone fractures. Your risk for osteoporosis can be identified using a bone density scan.  If you are 25 years of age or older, or if you are at risk for osteoporosis and fractures, ask your health care provider if you should be screened.  Ask your health care provider whether you should take a calcium or vitamin D supplement to lower your risk for osteoporosis.  Menopause may have certain physical symptoms and risks.  Hormone replacement therapy may reduce some of these symptoms and risks. Talk to your health care provider about whether hormone replacement therapy is right for you. Follow these instructions at home:  Schedule regular health, dental, and eye exams.  Stay current with your immunizations.  Do not use any tobacco products including cigarettes, chewing tobacco, or electronic cigarettes.  If you are pregnant, do not drink alcohol.  If you are breastfeeding, limit how much and how often you drink alcohol.  Limit alcohol intake to no more than 1 drink per day for nonpregnant women. One drink equals 12 ounces of beer, 5 ounces of wine, or 1 ounces of hard liquor.  Do not use street drugs.  Do not share needles.  Ask your health care provider for help if you need support or information about quitting drugs.  Tell your  health care provider if you often feel depressed.  Tell your health care provider if you have ever been abused or do not feel safe at home. This information is not intended to replace advice given to you by your health care provider. Make sure you discuss any questions you have with your health care provider. Document Released: 06/27/2011 Document Revised: 05/19/2016 Document Reviewed: 09/15/2015 Elsevier Interactive Patient Education  [employer].

## 2018-03-03 NOTE — Assessment & Plan Note (Signed)
Declines flu, pneumonia, and tetanus. Declines shingles. Colonoscopy up to date. Mammogram is up to date. DEXA is up to date. Counseled about sun safety and fall prevention. Given 10 year screening recommendations.

## 2018-03-03 NOTE — Assessment & Plan Note (Signed)
Checking lipid panel and adjust simvastatin as needed.  

## 2018-03-03 NOTE — Assessment & Plan Note (Signed)
Still using CPAP and notices benefit. Rarely misses when traveling only.

## 2018-03-03 NOTE — Assessment & Plan Note (Signed)
Taking losartan/hctz 100/12.5 mg daily. Checking CMP and adjust as needed.

## 2018-03-03 NOTE — Assessment & Plan Note (Signed)
Taking protonix and continue given increase in QOL.

## 2018-03-09 ENCOUNTER — Other Ambulatory Visit (INDEPENDENT_AMBULATORY_CARE_PROVIDER_SITE_OTHER): Payer: 59

## 2018-03-09 ENCOUNTER — Telehealth: Payer: Self-pay | Admitting: *Deleted

## 2018-03-09 DIAGNOSIS — Z0000 Encounter for general adult medical examination without abnormal findings: Secondary | ICD-10-CM

## 2018-03-09 LAB — CBC WITH DIFFERENTIAL/PLATELET
BASOS ABS: 0.1 10*3/uL (ref 0.0–0.1)
Basophils Relative: 0.9 % (ref 0.0–3.0)
Eosinophils Absolute: 0.1 10*3/uL (ref 0.0–0.7)
Eosinophils Relative: 1.6 % (ref 0.0–5.0)
HCT: 33 % — ABNORMAL LOW (ref 36.0–46.0)
Hemoglobin: 10.7 g/dL — ABNORMAL LOW (ref 12.0–15.0)
LYMPHS ABS: 2.6 10*3/uL (ref 0.7–4.0)
Lymphocytes Relative: 36.8 % (ref 12.0–46.0)
MCHC: 32.3 g/dL (ref 30.0–36.0)
MCV: 79.5 fl (ref 78.0–100.0)
MONO ABS: 0.6 10*3/uL (ref 0.1–1.0)
MONOS PCT: 8.7 % (ref 3.0–12.0)
NEUTROS ABS: 3.6 10*3/uL (ref 1.4–7.7)
NEUTROS PCT: 52 % (ref 43.0–77.0)
PLATELETS: 275 10*3/uL (ref 150.0–400.0)
RBC: 4.15 Mil/uL (ref 3.87–5.11)
RDW: 13.7 % (ref 11.5–15.5)
WBC: 6.9 10*3/uL (ref 4.0–10.5)

## 2018-03-09 LAB — LIPID PANEL
Cholesterol: 186 mg/dL (ref 0–200)
HDL: 71 mg/dL (ref >39.00)
LDL Cholesterol: 102 mg/dL — ABNORMAL HIGH (ref 0–99)
NONHDL: 114.69
Total CHOL/HDL Ratio: 3
Triglycerides: 63 mg/dL (ref 0.0–149.0)
VLDL: 12.6 mg/dL (ref 0.0–40.0)

## 2018-03-09 LAB — HEPATIC FUNCTION PANEL
ALBUMIN: 4.3 g/dL (ref 3.5–5.2)
ALT: 17 U/L (ref 0–35)
AST: 17 U/L (ref 0–37)
Alkaline Phosphatase: 101 U/L (ref 39–117)
BILIRUBIN TOTAL: 0.4 mg/dL (ref 0.2–1.2)
Bilirubin, Direct: 0.1 mg/dL (ref 0.0–0.3)
Total Protein: 7.9 g/dL (ref 6.0–8.3)

## 2018-03-09 LAB — URINALYSIS, ROUTINE W REFLEX MICROSCOPIC
BILIRUBIN URINE: NEGATIVE
HGB URINE DIPSTICK: NEGATIVE
Ketones, ur: NEGATIVE
LEUKOCYTES UA: NEGATIVE
NITRITE: NEGATIVE
RBC / HPF: NONE SEEN (ref 0–?)
Specific Gravity, Urine: 1.025 (ref 1.000–1.030)
TOTAL PROTEIN, URINE-UPE24: NEGATIVE
Urine Glucose: NEGATIVE
Urobilinogen, UA: 0.2 (ref 0.0–1.0)
pH: 6 (ref 5.0–8.0)

## 2018-03-09 LAB — BASIC METABOLIC PANEL
BUN: 16 mg/dL (ref 6–23)
CHLORIDE: 100 meq/L (ref 96–112)
CO2: 31 meq/L (ref 19–32)
Calcium: 10.6 mg/dL — ABNORMAL HIGH (ref 8.4–10.5)
Creatinine, Ser: 0.97 mg/dL (ref 0.40–1.20)
GFR: 73.87 mL/min (ref >60.00)
GLUCOSE: 85 mg/dL (ref 70–99)
Potassium: 3.8 mEq/L (ref 3.5–5.1)
Sodium: 137 mEq/L (ref 135–145)

## 2018-03-09 LAB — TSH: TSH: 0.78 u[IU]/mL (ref 0.35–4.50)

## 2018-03-09 NOTE — Telephone Encounter (Signed)
Pt walk in states saw MD last week for CPX, and was told to come back for her routine cpx labs. Pt went to lab and order was not put in. Reviewed chart pt saw MD on 3/8 for cpx, and was mention to come back for labs. Entered routine cpx labs.Marland KitchenJohny Chess

## 2018-04-04 ENCOUNTER — Other Ambulatory Visit: Payer: Self-pay | Admitting: Internal Medicine

## 2018-04-04 MED FILL — LOSARTAN POTASSIUM 50 MG TA: 50 | 90 days supply | Qty: 90 | Fill #3

## 2018-04-04 MED FILL — PANTOPRAZOLE SOD DR 40 MG T: 40 | 90 days supply | Qty: 90 | Fill #0

## 2018-04-20 ENCOUNTER — Other Ambulatory Visit: Payer: Self-pay | Admitting: Internal Medicine

## 2018-04-20 MED FILL — LOSARTAN-HCTZ 50-12.5 MG TA: 50-12.5 | 90 days supply | Qty: 90 | Fill #0

## 2018-05-16 ENCOUNTER — Encounter: Payer: Self-pay | Admitting: Family

## 2018-05-16 ENCOUNTER — Ambulatory Visit (INDEPENDENT_AMBULATORY_CARE_PROVIDER_SITE_OTHER): Payer: 59 | Admitting: Family

## 2018-05-16 VITALS — BP 118/68 | HR 68 | Temp 98.4°F | Ht 61.0 in | Wt 195.1 lb

## 2018-05-16 DIAGNOSIS — M722 Plantar fascial fibromatosis: Secondary | ICD-10-CM | POA: Diagnosis not present

## 2018-05-16 DIAGNOSIS — J309 Allergic rhinitis, unspecified: Secondary | ICD-10-CM

## 2018-05-16 MED ORDER — CEFDINIR 300 MG PO CAPS: 300.0000 mg | ORAL_CAPSULE | Freq: Two times a day (BID) | ORAL | 0 refills | Status: DC

## 2018-05-16 MED ORDER — FLUTICASONE PROPIONATE 50 MCG/ACT NA SUSP: 2.0000 | Freq: Every day | NASAL | 6 refills | Status: DC

## 2018-05-16 MED FILL — FLUTICASONE PROP 50 MCG SPR: 50 | 30 days supply | Qty: 16 | Fill #0

## 2018-05-16 NOTE — Progress Notes (Signed)
Sarah Santos is a 67 y.o. female with the following history as recorded in [employer]:  Patient Active Problem List   Diagnosis Date Noted  . OSA on CPAP 05/26/2017  . Routine general medical examination at a health care facility 06/13/2015  . 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 Anemia   . Hypertension 04/04/2012  . Primary hyperparathyroidism ([place]) 01/25/2011  . GERD 01/19/2011  . MICROSCOPIC HEMATURIA 07/02/2009  . Dyslipidemia 05/08/2009  . ALLERGIC RHINITIS 05/08/2009  . Personal history of colonic polyps 01/04/2008    Current Outpatient Medications  Medication Sig Dispense Refill  . loratadine (CLARITIN) 10 MG tablet Take 1 tablet (10 mg total) by mouth daily. 30 tablet 0  . losartan (COZAAR) 50 MG tablet Take 1 tablet (50 mg total) by mouth daily. 90 tablet 0  . losartan-hydrochlorothiazide (HYZAAR) 50-12.5 MG tablet TAKE 1 TABLET BY MOUTH DAILY. 90 tablet 3  . pantoprazole (PROTONIX) 40 MG tablet TAKE 1 TABLET BY MOUTH DAILY. 90 tablet 3  . simvastatin (ZOCOR) 20 MG tablet TAKE 1 TABLET BY MOUTH DAILY AT 6 PM. 90 tablet 0  . cefdinir (OMNICEF) 300 MG capsule Take 1 capsule (300 mg total) by mouth 2 (two) times daily. 20 capsule 0  . fluticasone (FLONASE) 50 MCG/ACT nasal spray Place 2 sprays into both nostrils daily. 16 g 6   No current facility-administered medications for this visit.     Allergies: Patient has no known allergies.  Past Medical History:  Diagnosis Date  . ALLERGIC RHINITIS   . 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 GERD   . HEADACHE, CHRONIC   . Heart murmur   . Hematuria, microscopic   . History of anemia   . Hx of adenomatous colonic polyps   . Hypercalcemia   . HYPERLIPIDEMIA   . Hypertension   . Personal history of colonic polyps 01/04/2008  . Primary hyperparathyroidism ([place])   . Sleep apnea     Past Surgical History:  Procedure Laterality Date  . COLONOSCOPY W/  POLYPECTOMY  2009, 08/18/11   2009:adenomas, largest 4 cm, rectal carcinoid, hemorrhoids  . OVARIAN CYST REMOVAL  70's  . TUBAL LIGATION      Family History  Problem Relation Age of Onset  . Heart disease Mother 98       PCI  . Pulmonary embolism Mother   . Cancer Other        Lung  . Heart disease Other        Coronary Atery Disease  . Stomach cancer Paternal Aunt   . Stroke Father   . Diabetes Brother   . Hyperlipidemia Brother   . Heart disease Brother   . Colon cancer Neg Hx   . Colon polyps Neg Hx   . Other Neg Hx        hyperparathyroidism  . Esophageal cancer Neg Hx   . Rectal cancer Neg Hx     Social History   Tobacco Use  . Smoking status: Former Smoker    Last attempt to quit: 03/03/1991    Years since quitting: 27.2  . Smokeless tobacco: Never Used  Substance Use Topics  . Alcohol use: No    Subjective:  Patient presents with concerns for possible sinus infection. Uses OTC Claritin with regularity; concerned about sense of left ear fullness/ pressure x 24 days; no fever; no chest pain or shortness of breath; no sore throat; did have  one episode of blood tinged mucus yesterday;   Also complaining of chronic left foot pain- has plantar fasciitis but worried about length of time symptoms have been present;   Objective:  Vitals:   05/16/18 1304  BP: 118/68  Pulse: 68  Temp: 98.4 F (36.9 C)  TempSrc: Oral  SpO2: 97%  Weight: 195 lb 1.3 oz (88.5 kg)  Height: 5\' 1"  (1.549 m)    General: Well developed, well nourished, in no acute distress  Skin : Warm and dry.  Head: Normocephalic and atraumatic  Eyes: Sclera and conjunctiva clear; pupils round and reactive to light; extraocular movements intact  Ears: External normal; canals clear; tympanic membranes normal  Oropharynx: Pink, supple. No suspicious lesions  Neck: Supple without thyromegaly, adenopathy  Lungs: Respirations unlabored; clear to auscultation bilaterally without wheeze, rales, rhonchi  [facility]  exam: normal rate and regular rhythm.  Neurologic: Alert and oriented; speech intact; face symmetrical; moves all extremities well; CNII-XII intact without focal deficit   Assessment:  1. Allergic rhinitis, unspecified seasonality, unspecified trigger   2. Plantar fasciitis of left foot     Plan:  1. Recommend to add Flonase to her Claritin; Rx for Omnicef 300 mg bid x 10 days- hold and fill only if worsening in the next 48 hours; follow-up worse, no better. 2. Refer to podiatry;    No follow-ups on file.  Orders Placed This Encounter  Procedures  . Ambulatory referral to Podiatry    Referral Priority:   Routine    Referral Type:   Consultation    Referral Reason:   Specialty Services Required    Requested Specialty:   Podiatry    Number of Visits Requested:   1    Requested Prescriptions   Signed Prescriptions Disp Refills  . fluticasone (FLONASE) 50 MCG/ACT nasal spray 16 g 6    Sig: Place 2 sprays into both nostrils daily.  . cefdinir (OMNICEF) 300 MG capsule 20 capsule 0    Sig: Take 1 capsule (300 mg total) by mouth 2 (two) times daily.

## 2018-05-26 ENCOUNTER — Ambulatory Visit (INDEPENDENT_AMBULATORY_CARE_PROVIDER_SITE_OTHER): Payer: 59

## 2018-05-26 ENCOUNTER — Ambulatory Visit (INDEPENDENT_AMBULATORY_CARE_PROVIDER_SITE_OTHER): Payer: 59 | Admitting: Podiatry

## 2018-05-26 ENCOUNTER — Encounter: Payer: Self-pay | Admitting: Podiatry

## 2018-05-26 VITALS — BP 122/58 | HR 68

## 2018-05-26 DIAGNOSIS — N951 Menopausal and female climacteric states: Secondary | ICD-10-CM

## 2018-05-26 DIAGNOSIS — M722 Plantar fascial fibromatosis: Secondary | ICD-10-CM

## 2018-05-26 DIAGNOSIS — N6452 Nipple discharge: Secondary | ICD-10-CM | POA: Insufficient documentation

## 2018-05-26 HISTORY — DX: Menopausal and female climacteric states: N95.1

## 2018-05-26 MED ORDER — TRIAMCINOLONE ACETONIDE 10 MG/ML IJ SUSP
10.0000 mg | Freq: Once | INTRAMUSCULAR | Status: AC
Start: 2018-05-26 — End: 2018-05-26
  Administered 2018-05-26: 10 mg

## 2018-05-26 NOTE — Patient Instructions (Signed)

## 2018-05-26 NOTE — Progress Notes (Signed)
Subjective:   Patient ID: Sarah Santos, female   DOB: 67 y.o.   MRN: [id number]   HPI Patient presents with severe pain in the left plantar heel of approximate 6 months duration.  Patient has had several bouts of this in both heels and states it is worse when she gets up in the morning and after periods of sitting.  Patient does not smoke and likes to be active   Review of Systems  All other systems reviewed and are negative.       Objective:  Physical Exam  Constitutional: She appears well-developed and well-nourished.  Cardiovascular: Intact distal pulses.  Pulmonary/Chest: Effort normal.  Musculoskeletal: Normal range of motion.  Neurological: She is alert.  Skin: Skin is warm.  Nursing note and vitals reviewed.   Neurovascular status intact muscle strength is adequate range of motion within normal limits with patient found to have exquisite discomfort in the plantar aspect of the left heel at the insertional point of the tendon into the calcaneus with moderate depression of the arch noted.  Patient is found to have good digital perfusion and is well oriented x3     Assessment:  Acute plantar fasciitis left heel with moderate instability of the arch mechanism     Plan:  H&P x-rays reviewed and today I injected the plantar fascia 3 mg Kenalog 5 mg Xylocaine applied to the plantar fascial strap and instructed on physical therapy.  Advised on adding heels to lift up the heel and to try to reduce activity and will be seen back again in the next several weeks  X-rays indicate small spur formation plantar heel with no indication of stress fracture arthritis

## 2018-06-04 DIAGNOSIS — G4733 Obstructive sleep apnea (adult) (pediatric): Secondary | ICD-10-CM | POA: Diagnosis not present

## 2018-06-15 ENCOUNTER — Ambulatory Visit: Payer: 59 | Admitting: Podiatry

## 2018-07-16 MED FILL — LOSARTAN POTASSIUM 50 MG TA: 50 | 90 days supply | Qty: 90 | Fill #0

## 2018-07-16 MED FILL — LOSARTAN-HCTZ 50-12.5 MG TA: 50-12.5 | 90 days supply | Qty: 90 | Fill #1

## 2018-07-16 MED FILL — PANTOPRAZOLE SOD DR 40 MG T: 40 | 90 days supply | Qty: 90 | Fill #1

## 2018-09-27 ENCOUNTER — Other Ambulatory Visit: Payer: Self-pay | Admitting: Internal Medicine

## 2018-09-27 DIAGNOSIS — J209 Acute bronchitis, unspecified: Secondary | ICD-10-CM

## 2018-09-27 DIAGNOSIS — J0140 Acute pansinusitis, unspecified: Secondary | ICD-10-CM

## 2018-09-27 MED ORDER — PANTOPRAZOLE SODIUM 40 MG PO TBEC: 40.0000 mg | DELAYED_RELEASE_TABLET | Freq: Every day | ORAL | 1 refills | Status: DC

## 2018-09-27 MED ORDER — SIMVASTATIN 20 MG PO TABS: ORAL_TABLET | ORAL | 1 refills | Status: DC

## 2018-09-27 NOTE — Telephone Encounter (Signed)
Requested Prescriptions  Pending Prescriptions Disp Refills  . losartan (COZAAR) 50 MG tablet 90 tablet 0    Sig: Take 1 tablet (50 mg total) by mouth daily.     Cardiovascular:  Angiotensin Receptor Blockers Failed - 09/27/2018  3:53 PM      Failed - Cr in normal range and within 180 days    Creatinine, Ser  Date Value Ref Range Status  03/09/2018 0.97 0.40 - 1.20 mg/dL Final         Failed - K in normal range and within 180 days    Potassium  Date Value Ref Range Status  03/09/2018 3.8 3.5 - 5.1 mEq/L Final         Failed - Valid encounter within last 6 months    Recent Outpatient Visits          4 months ago Allergic rhinitis, unspecified seasonality, unspecified trigger   [place], Marvis Repress, FNP   6 months ago Routine general medical examination at a health care facility   [place], Elizabeth A, MD   1 year ago Acute non-recurrent pansinusitis   [employer], Charlene Brooke, NP   1 year ago OSA on CPAP   [place], Elizabeth A, MD   1 year ago Primary hyperparathyroidism [facility])   [place], MD             Passed - Patient is not pregnant      Passed - Last BP in normal range    BP Readings from Last 1 Encounters:  05/26/18 (!) 122/58       . losartan-hydrochlorothiazide (HYZAAR) 50-12.5 MG tablet 90 tablet 3    Sig: Take 1 tablet by mouth daily.     Cardiovascular: ARB + Diuretic Combos Failed - 09/27/2018  3:53 PM      Failed - K in normal range and within 180 days    Potassium  Date Value Ref Range Status  03/09/2018 3.8 3.5 - 5.1 mEq/L Final         Failed - Na in normal range and within 180 days    Sodium  Date Value Ref Range Status  03/09/2018 137 135 - 145 mEq/L Final         Failed - Cr in normal range and within 180 days    Creatinine, Ser  Date Value  Ref Range Status  03/09/2018 0.97 0.40 - 1.20 mg/dL Final         Failed - Ca in normal range and within 180 days    Calcium  Date Value Ref Range Status  03/09/2018 10.6 (H) 8.4 - 10.5 mg/dL Final   Calcium, Total (PTH)  Date Value Ref Range Status  08/26/2011 11.0 (H) 8.4 - 10.5 mg/dL Final         Failed - Valid encounter within last 6 months    Recent Outpatient Visits          4 months ago Allergic rhinitis, unspecified seasonality, unspecified trigger   [place], Marvis Repress, FNP   6 months ago Routine general medical examination at a health care facility   [place], Elizabeth A, MD   1 year ago Acute non-recurrent pansinusitis   [place], Charlene Brooke, NP   1 year ago OSA on CPAP  [facility] -Chuck Hint, MD   1 year ago Primary hyperparathyroidism [facility])   [place], MD             Passed - Patient is not pregnant      Passed - Last BP in normal range    BP Readings from Last 1 Encounters:  05/26/18 (!) 122/58       . pantoprazole (PROTONIX) 40 MG tablet 90 tablet 1    Sig: Take 1 tablet (40 mg total) by mouth daily.     Gastroenterology: Proton Pump Inhibitors Passed - 09/27/2018  3:53 PM      Passed - Valid encounter within last 12 months    Recent Outpatient Visits          4 months ago Allergic rhinitis, unspecified seasonality, unspecified trigger   [place], Marvis Repress, [place]   6 months ago Routine general medical examination at a health care facility   [place], Elizabeth A, MD   1 year ago Acute non-recurrent pansinusitis   [place], Charlene Brooke, NP   1 year ago OSA on CPAP   [facility] -Chuck Hint, MD   1  year ago Primary hyperparathyroidism [facility])   [place], Elizabeth A, MD           . simvastatin (ZOCOR) 20 MG tablet 90 tablet 1    Sig: TAKE 1 TABLET BY MOUTH DAILY AT 6 PM.     Cardiovascular:  Antilipid - Statins Failed - 09/27/2018  3:53 PM      Failed - LDL in normal range and within 360 days    LDL Cholesterol  Date Value Ref Range Status  03/09/2018 102 (H) 0 - 99 mg/dL Final         Passed - Total Cholesterol in normal range and within 360 days    Cholesterol  Date Value Ref Range Status  03/09/2018 186 0 - 200 mg/dL Final    Comment:    ATP III Classification       Desirable:  < 200 mg/dL               Borderline High:  200 - 239 mg/dL          High:  > = 240 mg/dL         Passed - HDL in normal range and within 360 days    HDL  Date Value Ref Range Status  03/09/2018 71.00 >39.00 mg/dL Final         Passed - Triglycerides in normal range and within 360 days    Triglycerides  Date Value Ref Range Status  03/09/2018 63.0 0.0 - 149.0 mg/dL Final    Comment:    Normal:  <150 mg/dLBorderline High:  150 - 199 mg/dL         Passed - Patient is not pregnant      Passed - Valid encounter within last 12 months    Recent Outpatient Visits          4 months ago Allergic rhinitis, unspecified seasonality, unspecified trigger   [place], Marvis Repress, FNP   6 months ago Routine general medical examination at a health care facility   [place], Elizabeth A, MD   1 year ago Acute  non-recurrent pansinusitis   [place], NP   1 year ago OSA on CPAP   [facility] -Chuck Hint, MD   1 year ago Primary hyperparathyroidism [facility])   [place], MD           LDL 102 in 02/2018; per result note no change in medication

## 2018-09-27 NOTE — Telephone Encounter (Signed)
Requested medication (s) are due for refill today:  yes  Requested medication (s) are on the active medication list:  yes  Future visit scheduled:  no  Last Refill: Losartan 50 mg tab; 01/11/18                   Losartan HCTZ  50 - 12.5 mg. 04/20/18  Overdue for Creatinine and Potassium    Requested Prescriptions  Pending Prescriptions Disp Refills   losartan (COZAAR) 50 MG tablet 90 tablet 0    Sig: Take 1 tablet (50 mg total) by mouth daily.     Cardiovascular:  Angiotensin Receptor Blockers Failed - 09/27/2018  3:53 PM      Failed - Cr in normal range and within 180 days    Creatinine, Ser  Date Value Ref Range Status  03/09/2018 0.97 0.40 - 1.20 mg/dL Final         Failed - K in normal range and within 180 days    Potassium  Date Value Ref Range Status  03/09/2018 3.8 3.5 - 5.1 mEq/L Final         Failed - Valid encounter within last 6 months    Recent Outpatient Visits          4 months ago Allergic rhinitis, unspecified seasonality, unspecified trigger   [place], Marvis Repress, FNP   6 months ago Routine general medical examination at a health care facility   [place], Elizabeth A, MD   1 year ago Acute non-recurrent pansinusitis   [place], Charlene Brooke, NP   1 year ago OSA on CPAP   [facility] -Chuck Hint, MD   1 year ago Primary hyperparathyroidism [facility])   [place], Elizabeth A, MD             Passed - Patient is not pregnant      Passed - Last BP in normal range    BP Readings from Last 1 Encounters:  05/26/18 (!) 122/58        losartan-hydrochlorothiazide (HYZAAR) 50-12.5 MG tablet 90 tablet 3    Sig: Take 1 tablet by mouth daily.     Cardiovascular: ARB + Diuretic Combos Failed - 09/27/2018  3:53 PM      Failed - K in normal range and within 180 days    Potassium   Date Value Ref Range Status  03/09/2018 3.8 3.5 - 5.1 mEq/L Final         Failed - Na in normal range and within 180 days    Sodium  Date Value Ref Range Status  03/09/2018 137 135 - 145 mEq/L Final         Failed - Cr in normal range and within 180 days    Creatinine, Ser  Date Value Ref Range Status  03/09/2018 0.97 0.40 - 1.20 mg/dL Final         Failed - Ca in normal range and within 180 days    Calcium  Date Value Ref Range Status  03/09/2018 10.6 (H) 8.4 - 10.5 mg/dL Final   Calcium, Total (PTH)  Date Value Ref Range Status  08/26/2011 11.0 (H) 8.4 - 10.5 mg/dL Final         Failed - Valid encounter within last 6 months    Recent Outpatient Visits          4 months ago  Allergic rhinitis, unspecified seasonality, unspecified trigger   [place], Marvis Repress, [place]   6 months ago Routine general medical examination at a health care facility   [place], Elizabeth A, MD   1 year ago Acute non-recurrent pansinusitis   [place], Charlene Brooke, NP   1 year ago OSA on CPAP   [facility] -Chuck Hint, MD   1 year ago Primary hyperparathyroidism [facility])   [place], Elizabeth A, MD             Passed - Patient is not pregnant      Passed - Last BP in normal range    BP Readings from Last 1 Encounters:  05/26/18 (!) 122/58       Signed Prescriptions Disp Refills   pantoprazole (PROTONIX) 40 MG tablet 90 tablet 1    Sig: Take 1 tablet (40 mg total) by mouth daily.     Gastroenterology: Proton Pump Inhibitors Passed - 09/27/2018  3:53 PM      Passed - Valid encounter within last 12 months    Recent Outpatient Visits          4 months ago Allergic rhinitis, unspecified seasonality, unspecified trigger   [place], Marvis Repress, [place]   6 months ago  Routine general medical examination at a health care facility   [place], Elizabeth A, MD   1 year ago Acute non-recurrent pansinusitis   [place], Charlene Brooke, NP   1 year ago OSA on CPAP   [place], Elizabeth A, MD   1 year ago Primary hyperparathyroidism [facility])   [place], MD            simvastatin (ZOCOR) 20 MG tablet 90 tablet 1    Sig: TAKE 1 TABLET BY MOUTH DAILY AT 6 PM.     Cardiovascular:  Antilipid - Statins Failed - 09/27/2018  3:53 PM      Failed - LDL in normal range and within 360 days    LDL Cholesterol  Date Value Ref Range Status  03/09/2018 102 (H) 0 - 99 mg/dL Final         Passed - Total Cholesterol in normal range and within 360 days    Cholesterol  Date Value Ref Range Status  03/09/2018 186 0 - 200 mg/dL Final    Comment:    ATP III Classification       Desirable:  < 200 mg/dL               Borderline High:  200 - 239 mg/dL          High:  > = 240 mg/dL         Passed - HDL in normal range and within 360 days    HDL  Date Value Ref Range Status  03/09/2018 71.00 >39.00 mg/dL Final         Passed - Triglycerides in normal range and within 360 days    Triglycerides  Date Value Ref Range Status  03/09/2018 63.0 0.0 - 149.0 mg/dL Final    Comment:    Normal:  <150 mg/dLBorderline High:  150 - 199 mg/dL         Passed - Patient is not pregnant  Passed - Valid encounter within last 12 months    Recent Outpatient Visits          4 months ago Allergic rhinitis, unspecified seasonality, unspecified trigger   [place], Marvis Repress, FNP   6 months ago Routine general medical examination at a health care facility   [place], Elizabeth A, MD   1 year ago Acute non-recurrent pansinusitis   [place], Charlene Brooke, NP   1 year ago OSA on CPAP   [place], Elizabeth A, MD   1 year ago Primary hyperparathyroidism [facility])   [facility] -Chuck Hint, MD            Requested Prescriptions  Pending Prescriptions Disp Refills   losartan (COZAAR) 50 MG tablet 90 tablet 0    Sig: Take 1 tablet (50 mg total) by mouth daily.     Cardiovascular:  Angiotensin Receptor Blockers Failed - 09/27/2018  3:53 PM      Failed - Cr in normal range and within 180 days    Creatinine, Ser  Date Value Ref Range Status  03/09/2018 0.97 0.40 - 1.20 mg/dL Final         Failed - K in normal range and within 180 days    Potassium  Date Value Ref Range Status  03/09/2018 3.8 3.5 - 5.1 mEq/L Final         Failed - Valid encounter within last 6 months    Recent Outpatient Visits          4 months ago Allergic rhinitis, unspecified seasonality, unspecified trigger   [place], Marvis Repress, FNP   6 months ago Routine general medical examination at a health care facility   [place], Elizabeth A, MD   1 year ago Acute non-recurrent pansinusitis   [place], Charlene Brooke, NP   1 year ago OSA on CPAP   [facility] -Chuck Hint, MD   1 year ago Primary hyperparathyroidism [facility])   [place], Elizabeth A, MD             Passed - Patient is not pregnant      Passed - Last BP in normal range    BP Readings from Last 1 Encounters:  05/26/18 (!) 122/58        losartan-hydrochlorothiazide (HYZAAR) 50-12.5 MG tablet 90 tablet 3    Sig: Take 1 tablet by mouth daily.     Cardiovascular: ARB + Diuretic Combos Failed - 09/27/2018  3:53 PM      Failed - K in normal range and within 180 days    Potassium  Date Value Ref Range Status   03/09/2018 3.8 3.5 - 5.1 mEq/L Final         Failed - Na in normal range and within 180 days    Sodium  Date Value Ref Range Status  03/09/2018 137 135 - 145 mEq/L Final         Failed - Cr in normal range and within 180 days    Creatinine, Ser  Date Value Ref Range Status  03/09/2018 0.97 0.40 - 1.20 mg/dL Final         Failed - Ca in normal range and within 180 days    Calcium  Date Value  Ref Range Status  03/09/2018 10.6 (H) 8.4 - 10.5 mg/dL Final   Calcium, Total (PTH)  Date Value Ref Range Status  08/26/2011 11.0 (H) 8.4 - 10.5 mg/dL Final         Failed - Valid encounter within last 6 months    Recent Outpatient Visits          4 months ago Allergic rhinitis, unspecified seasonality, unspecified trigger   [place], Marvis Repress, FNP   6 months ago Routine general medical examination at a health care facility   [place], Elizabeth A, MD   1 year ago Acute non-recurrent pansinusitis   [place], Charlene Brooke, NP   1 year ago OSA on CPAP   [facility] -Chuck Hint, MD   1 year ago Primary hyperparathyroidism [facility])   [place], Elizabeth A, MD             Passed - Patient is not pregnant      Passed - Last BP in normal range    BP Readings from Last 1 Encounters:  05/26/18 (!) 122/58       Signed Prescriptions Disp Refills   pantoprazole (PROTONIX) 40 MG tablet 90 tablet 1    Sig: Take 1 tablet (40 mg total) by mouth daily.     Gastroenterology: Proton Pump Inhibitors Passed - 09/27/2018  3:53 PM      Passed - Valid encounter within last 12 months    Recent Outpatient Visits          4 months ago Allergic rhinitis, unspecified seasonality, unspecified trigger   [place], Marvis Repress, [place]   6 months ago Routine general medical  examination at a health care facility   [place], Elizabeth A, MD   1 year ago Acute non-recurrent pansinusitis   [place], Charlene Brooke, NP   1 year ago OSA on CPAP   [facility] -Ch[facility] Hint, MD   1 year ago Primary hyperparathyroidism [facility])   [place], Elizabeth A, MD            simvastatin (ZOCOR) 20 MG tablet 90 tablet 1    Sig: TAKE 1 TABLET BY MOUTH DAILY AT 6 PM.     Cardiovascular:  Antilipid - Statins Failed - 09/27/2018  3:53 PM      Failed - LDL in normal range and within 360 days    LDL Cholesterol  Date Value Ref Range Status  03/09/2018 102 (H) 0 - 99 mg/dL Final         Passed - Total Cholesterol in normal range and within 360 days    Cholesterol  Date Value Ref Range Status  03/09/2018 186 0 - 200 mg/dL Final    Comment:    ATP III Classification       Desirable:  < 200 mg/dL               Borderline High:  200 - 239 mg/dL          High:  > = 240 mg/dL         Passed - HDL in normal range and within 360 days    HDL  Date Value Ref Range Status  03/09/2018 71.00 >39.00 mg/dL Final  Passed - Triglycerides in normal range and within 360 days    Triglycerides  Date Value Ref Range Status  03/09/2018 63.0 0.0 - 149.0 mg/dL Final    Comment:    Normal:  <150 mg/dLBorderline High:  150 - 199 mg/dL         Passed - Patient is not pregnant      Passed - Valid encounter within last 12 months    Recent Outpatient Visits          4 months ago Allergic rhinitis, unspecified seasonality, unspecified trigger   [place], Marvis Repress, FNP   6 months ago Routine general medical examination at a health care facility   [place], Elizabeth A, MD   1 year ago Acute non-recurrent pansinusitis   [place],  Charlene Brooke, NP   1 year ago OSA on CPAP   [facility] -Chuck Hint, MD   1 year ago Primary hyperparathyroidism [facility])   [facility] -Chuck Hint, MD

## 2018-09-27 NOTE — Telephone Encounter (Signed)
Copied from [place] [id number]. Topic: Quick Communication - Rx Refill/Question >> Sep 27, 2018  3:21 PM Wynetta Emery, [place] C wrote: Medication: losartan (COZAAR) 50 MG tablet, losartan-hydrochlorothiazide (HYZAAR) 50-12.5 MG tablet, pantoprazole (PROTONIX) 40 MG tablet , simvastatin (ZOCOR) 20 MG tablet   Has the patient contacted their pharmacy? Yes  (Agent: If no, request that the patient contact the pharmacy for the refill.) (Agent: If yes, when and what did the pharmacy advise?)  Preferred Pharmacy (with phone number or street name): [facility] - phone [phone]  [phone]   Agent: Please be advised that RX refills may take up to 3 business days. We ask that you follow-up with your pharmacy.

## 2018-09-28 MED ORDER — LOSARTAN POTASSIUM 50 MG PO TABS: 50.0000 mg | ORAL_TABLET | Freq: Every day | ORAL | 0 refills | Status: DC

## 2018-09-28 MED ORDER — LOSARTAN POTASSIUM-HCTZ 50-12.5 MG PO TABS: 1.0000 | ORAL_TABLET | Freq: Every day | ORAL | 0 refills | Status: DC

## 2018-10-16 ENCOUNTER — Ambulatory Visit (INDEPENDENT_AMBULATORY_CARE_PROVIDER_SITE_OTHER): Payer: Medicare HMO

## 2018-10-16 DIAGNOSIS — Z23 Encounter for immunization: Secondary | ICD-10-CM

## 2018-11-23 DIAGNOSIS — G4733 Obstructive sleep apnea (adult) (pediatric): Secondary | ICD-10-CM | POA: Diagnosis not present

## 2018-12-24 ENCOUNTER — Other Ambulatory Visit: Payer: Self-pay | Admitting: Internal Medicine

## 2018-12-27 ENCOUNTER — Other Ambulatory Visit: Payer: Self-pay | Admitting: Obstetrics & Gynecology

## 2018-12-27 DIAGNOSIS — Z1231 Encounter for screening mammogram for malignant neoplasm of breast: Secondary | ICD-10-CM

## 2019-01-08 ENCOUNTER — Encounter: Payer: 59 | Admitting: Obstetrics & Gynecology

## 2019-01-11 ENCOUNTER — Ambulatory Visit (INDEPENDENT_AMBULATORY_CARE_PROVIDER_SITE_OTHER): Payer: Medicare HMO | Admitting: Obstetrics & Gynecology

## 2019-01-11 ENCOUNTER — Encounter: Payer: 59 | Admitting: Obstetrics & Gynecology

## 2019-01-11 ENCOUNTER — Encounter: Payer: Self-pay | Admitting: Obstetrics & Gynecology

## 2019-01-11 VITALS — BP 128/70 | Ht 60.75 in | Wt 196.4 lb

## 2019-01-11 DIAGNOSIS — E6609 Other obesity due to excess calories: Secondary | ICD-10-CM

## 2019-01-11 DIAGNOSIS — Z01419 Encounter for gynecological examination (general) (routine) without abnormal findings: Secondary | ICD-10-CM | POA: Diagnosis not present

## 2019-01-11 DIAGNOSIS — Z780 Asymptomatic menopausal state: Secondary | ICD-10-CM

## 2019-01-11 DIAGNOSIS — Z6837 Body mass index (BMI) 37.0-37.9, adult: Secondary | ICD-10-CM

## 2019-01-11 DIAGNOSIS — M8589 Other specified disorders of bone density and structure, multiple sites: Secondary | ICD-10-CM

## 2019-01-11 NOTE — Patient Instructions (Signed)
1. Well female exam with routine gynecological exam Normal gynecologic exam and menopause.  Pap test negative November 2018.  No indication to repeat this year.  Breast exam normal.  Screening mammogram scheduled for February 2020.  Colonoscopy in 2018.  Health labs with family physician.  2. Postmenopausal Well on no hormone replacement therapy.  No postmenopausal bleeding.  3. Osteopenia of multiple sites Osteopenia on bone density April 2018.  Recommend vitamin D supplements, calcium intake of 1.2 to 1.5 g/day and regular weightbearing physical activities.  4. Class 2 obesity due to excess calories without serious comorbidity with body mass index (BMI) of 37.0 to 37.9 in adult Recommend a lower calorie/carb diet such as [employer].  Aerobic physical activities 5 times a week and weightlifting every 2 days.  Sarah Santos, it was a pleasure seeing you today!

## 2019-01-11 NOTE — Progress Notes (Signed)
Sarah Santos Jul 21, 1951 [id number]   History:    68 y.o. G3P2A1L2   RP:  Established patient presenting for annual gyn exam   HPI: Menopause, well on no hormone replacement therapy.  No postmenopausal bleeding.  No pelvic pain.  Abstinent.  Urine and bowel movements normal.  Breasts normal.  Body mass index 37.42.  Goes to the gym twice a week.  Good nutrition.  Health labs with family physician.  Past medical history,surgical history, family history and social history were all reviewed and documented in the EPIC chart.  Gynecologic History No LMP recorded. Patient is postmenopausal. Contraception: post menopausal status Last Pap: 10/2017. Results were: Negative Last mammogram: 11/2017. Results were: negative.  Next Mammo scheduled 01/2019 Bone Density: 03/2017 Osteopenia T-Score -1.3 Femoral Neck Colonoscopy: 2018  Obstetric History OB History  Gravida Para Term Preterm AB Living  3 2     1 2   SAB TAB Ectopic Multiple Live Births               # Outcome Date GA Lbr Len/2nd Weight Sex Delivery Anes PTL Lv  3 AB           2 Para           1 Para              ROS: A ROS was performed and pertinent positives and negatives are included in the history.  GENERAL: No fevers or chills. HEENT: No change in vision, no earache, sore throat or sinus congestion. NECK: No pain or stiffness. CARDIOVASCULAR: No chest pain or pressure. No palpitations. PULMONARY: No shortness of breath, cough or wheeze. GASTROINTESTINAL: No abdominal pain, nausea, vomiting or diarrhea, melena or bright red blood per rectum. GENITOURINARY: No urinary frequency, urgency, hesitancy or dysuria. MUSCULOSKELETAL: No joint or muscle pain, no back pain, no recent trauma. DERMATOLOGIC: No rash, no itching, no lesions. ENDOCRINE: No polyuria, polydipsia, no heat or cold intolerance. No recent change in weight. HEMATOLOGICAL: No anemia or easy bruising or bleeding. NEUROLOGIC: No headache, seizures, numbness,  tingling or weakness. PSYCHIATRIC: No depression, no loss of interest in normal activity or change in sleep pattern.     Exam:   BP 128/70   Ht 5' 0.75" (1.543 m)   Wt 196 lb 6.4 oz (89.1 kg)   BMI 37.42 kg/m   Body mass index is 37.42 kg/m.  General appearance : Well developed well nourished female. No acute distress HEENT: Eyes: no retinal hemorrhage or exudates,  Neck supple, trachea midline, no carotid bruits, no thyroidmegaly Lungs: Clear to auscultation, no rhonchi or wheezes, or rib retractions  Heart: Regular rate and rhythm, no murmurs or gallops Breast:Examined in sitting and supine position were symmetrical in appearance, no palpable masses or tenderness,  no skin retraction, no nipple inversion, no nipple discharge, no skin discoloration, no axillary or supraclavicular lymphadenopathy Abdomen: no palpable masses or tenderness, no rebound or guarding Extremities: no edema or skin discoloration or tenderness  Pelvic: Vulva: Normal             Vagina: No gross lesions or discharge  Cervix: No gross lesions or discharge  Uterus  AV, normal size, shape and consistency, non-tender and mobile  Adnexa  Without masses or tenderness  Anus: Normal   Assessment/Plan:  68 y.o. female for annual exam   1. Well female exam with routine gynecological exam Normal gynecologic exam and menopause.  Pap test negative November 2018.  No indication to repeat this year.  Breast exam normal.  Screening mammogram scheduled for February 2020.  Colonoscopy in 2018.  Health labs with family physician.  2. Postmenopausal Well on no hormone replacement therapy.  No postmenopausal bleeding.  3. Osteopenia of multiple sites Osteopenia on bone density April 2018.  Recommend vitamin D supplements, calcium intake of 1.2 to 1.5 g/day and regular weightbearing physical activities.  4. Class 2 obesity due to excess calories without serious comorbidity with body mass index (BMI) of 37.0 to 37.9 in  adult Recommend a lower calorie/carb diet such as [employer].  Aerobic physical activities 5 times a week and weightlifting every 2 days.  Princess Bruins MD, 12:06 PM 01/11/2019

## 2019-01-25 ENCOUNTER — Ambulatory Visit: Payer: Medicare HMO

## 2019-01-31 ENCOUNTER — Other Ambulatory Visit (INDEPENDENT_AMBULATORY_CARE_PROVIDER_SITE_OTHER): Payer: Medicare HMO

## 2019-01-31 ENCOUNTER — Encounter: Payer: Self-pay | Admitting: Internal Medicine

## 2019-01-31 ENCOUNTER — Ambulatory Visit (INDEPENDENT_AMBULATORY_CARE_PROVIDER_SITE_OTHER): Payer: Medicare HMO | Admitting: Internal Medicine

## 2019-01-31 VITALS — BP 116/70 | HR 64 | Temp 98.3°F | Ht 60.75 in | Wt 197.0 lb

## 2019-01-31 DIAGNOSIS — Z0000 Encounter for general adult medical examination without abnormal findings: Secondary | ICD-10-CM

## 2019-01-31 DIAGNOSIS — G4733 Obstructive sleep apnea (adult) (pediatric): Secondary | ICD-10-CM | POA: Diagnosis not present

## 2019-01-31 DIAGNOSIS — I10 Essential (primary) hypertension: Secondary | ICD-10-CM

## 2019-01-31 DIAGNOSIS — Z9989 Dependence on other enabling machines and devices: Secondary | ICD-10-CM | POA: Diagnosis not present

## 2019-01-31 DIAGNOSIS — K219 Gastro-esophageal reflux disease without esophagitis: Secondary | ICD-10-CM

## 2019-01-31 DIAGNOSIS — E785 Hyperlipidemia, unspecified: Secondary | ICD-10-CM | POA: Diagnosis not present

## 2019-01-31 LAB — CBC
HCT: 33.4 % — ABNORMAL LOW (ref 36.0–46.0)
Hemoglobin: 10.8 g/dL — ABNORMAL LOW (ref 12.0–15.0)
MCHC: 32.2 g/dL (ref 30.0–36.0)
MCV: 79.9 fl (ref 78.0–100.0)
Platelets: 298 10*3/uL (ref 150.0–400.0)
RBC: 4.19 Mil/uL (ref 3.87–5.11)
RDW: 14.2 % (ref 11.5–15.5)
WBC: 6.7 10*3/uL (ref 4.0–10.5)

## 2019-01-31 LAB — COMPREHENSIVE METABOLIC PANEL
ALT: 13 U/L (ref 0–35)
AST: 15 U/L (ref 0–37)
Albumin: 4.2 g/dL (ref 3.5–5.2)
Alkaline Phosphatase: 104 U/L (ref 39–117)
BUN: 22 mg/dL (ref 6–23)
CO2: 29 mEq/L (ref 19–32)
Calcium: 10.7 mg/dL — ABNORMAL HIGH (ref 8.4–10.5)
Chloride: 103 mEq/L (ref 96–112)
Creatinine, Ser: 0.99 mg/dL (ref 0.40–1.20)
GFR: 67.7 mL/min (ref >60.00)
Glucose, Bld: 84 mg/dL (ref 70–99)
Potassium: 4 mEq/L (ref 3.5–5.1)
Sodium: 138 mEq/L (ref 135–145)
Total Bilirubin: 0.5 mg/dL (ref 0.2–1.2)
Total Protein: 7.8 g/dL (ref 6.0–8.3)

## 2019-01-31 LAB — LIPID PANEL
Cholesterol: 207 mg/dL — ABNORMAL HIGH (ref 0–200)
HDL: 61.9 mg/dL (ref >39.00)
LDL Cholesterol: 128 mg/dL — ABNORMAL HIGH (ref 0–99)
NonHDL: 144.96
Total CHOL/HDL Ratio: 3
Triglycerides: 85 mg/dL (ref 0.0–149.0)
VLDL: 17 mg/dL (ref 0.0–40.0)

## 2019-01-31 LAB — HEMOGLOBIN A1C: Hgb A1c MFr Bld: 6 % (ref 4.6–6.5)

## 2019-01-31 NOTE — Assessment & Plan Note (Signed)
Taking simvastatin 20 mg daily, checking lipid panel and adjust as needed.  

## 2019-01-31 NOTE — Patient Instructions (Signed)
We will check the blood work today.   Health Maintenance, Female Adopting a healthy lifestyle and getting preventive care can go a long way to promote health and wellness. Talk with your health care provider about what schedule of regular examinations is right for you. This is a good chance for you to check in with your provider about disease prevention and staying healthy. In between checkups, there are plenty of things you can do on your own. Experts have done a lot of research about which lifestyle changes and preventive measures are most likely to keep you healthy. Ask your health care provider for more information. Weight and diet Eat a healthy diet  Be sure to include plenty of vegetables, fruits, low-fat dairy products, and lean protein.  Do not eat a lot of foods high in solid fats, added sugars, or salt.  Get regular exercise. This is one of the most important things you can do for your health. ? Most adults should exercise for at least 150 minutes each week. The exercise should increase your heart rate and make you sweat (moderate-intensity exercise). ? Most adults should also do strengthening exercises at least twice a week. This is in addition to the moderate-intensity exercise. Maintain a healthy weight  Body mass index (BMI) is a measurement that can be used to identify possible weight problems. It estimates body fat based on height and weight. Your health care provider can help determine your BMI and help you achieve or maintain a healthy weight.  For females 73 years of age and older: ? A BMI below 18.5 is considered underweight. ? A BMI of 18.5 to 24.9 is normal. ? A BMI of 25 to 29.9 is considered overweight. ? A BMI of 30 and above is considered obese. Watch levels of cholesterol and blood lipids  You should start having your blood tested for lipids and cholesterol at 67 years of age, then have this test every 5 years.  You may need to have your cholesterol levels  checked more often if: ? Your lipid or cholesterol levels are high. ? You are older than 67 years of age. ? You are at high risk for heart disease. Cancer screening Lung Cancer  Lung cancer screening is recommended for adults 57-75 years old who are at high risk for lung cancer because of a history of smoking.  A yearly low-dose CT scan of the lungs is recommended for people who: ? Currently smoke. ? Have quit within the past 15 years. ? Have at least a 30-pack-year history of smoking. A pack year is smoking an average of one pack of cigarettes a day for 1 year.  Yearly screening should continue until it has been 15 years since you quit.  Yearly screening should stop if you develop a health problem that would prevent you from having lung cancer treatment. Breast Cancer  Practice breast self-awareness. This means understanding how your breasts normally appear and feel.  It also means doing regular breast self-exams. Let your health care provider know about any changes, no matter how small.  If you are in your 20s or 30s, you should have a clinical breast exam (CBE) by a health care provider every 1-3 years as part of a regular health exam.  If you are 1 or older, have a CBE every year. Also consider having a breast X-ray (mammogram) every year.  If you have a family history of breast cancer, talk to your health care provider about genetic screening.  If  you are at high risk for breast cancer, talk to your health care provider about having an MRI and a mammogram every year.  Breast cancer gene (BRCA) assessment is recommended for women who have family members with BRCA-related cancers. BRCA-related cancers include: ? Breast. ? Ovarian. ? Tubal. ? Peritoneal cancers.  Results of the assessment will determine the need for genetic counseling and BRCA1 and BRCA2 testing. Cervical Cancer Your health care provider may recommend that you be screened regularly for cancer of the pelvic  organs (ovaries, uterus, and vagina). This screening involves a pelvic examination, including checking for microscopic changes to the surface of your cervix (Pap test). You may be encouraged to have this screening done every 3 years, beginning at age 46.  For women ages 104-65, health care providers may recommend pelvic exams and Pap testing every 3 years, or they may recommend the Pap and pelvic exam, combined with testing for human papilloma virus (HPV), every 5 years. Some types of HPV increase your risk of cervical cancer. Testing for HPV may also be done on women of any age with unclear Pap test results.  Other health care providers may not recommend any screening for nonpregnant women who are considered low risk for pelvic cancer and who do not have symptoms. Ask your health care provider if a screening pelvic exam is right for you.  If you have had past treatment for cervical cancer or a condition that could lead to cancer, you need Pap tests and screening for cancer for at least 20 years after your treatment. If Pap tests have been discontinued, your risk factors (such as having a new sexual partner) need to be reassessed to determine if screening should resume. Some women have medical problems that increase the chance of getting cervical cancer. In these cases, your health care provider may recommend more frequent screening and Pap tests. Colorectal Cancer  This type of cancer can be detected and often prevented.  Routine colorectal cancer screening usually begins at 67 years of age and continues through 67 years of age.  Your health care provider may recommend screening at an earlier age if you have risk factors for colon cancer.  Your health care provider may also recommend using home test kits to check for hidden blood in the stool.  A small camera at the end of a tube can be used to examine your colon directly (sigmoidoscopy or colonoscopy). This is done to check for the earliest forms  of colorectal cancer.  Routine screening usually begins at age 67.  Direct examination of the colon should be repeated every 5-10 years through 67 years of age. However, you may need to be screened more often if early forms of precancerous polyps or small growths are found. Skin Cancer  Check your skin from head to toe regularly.  Tell your health care provider about any new moles or changes in moles, especially if there is a change in a mole's shape or color.  Also tell your health care provider if you have a mole that is larger than the size of a pencil eraser.  Always use sunscreen. Apply sunscreen liberally and repeatedly throughout the day.  Protect yourself by wearing long sleeves, pants, a wide-brimmed hat, and sunglasses whenever you are outside. Heart disease, diabetes, and high blood pressure  High blood pressure causes heart disease and increases the risk of stroke. High blood pressure is more likely to develop in: ? People who have blood pressure in the high end  of the normal range (130-139/85-89 mm Hg). ? People who are overweight or obese. ? People who are African American.  If you are 85-16 years of age, have your blood pressure checked every 3-5 years. If you are 22 years of age or older, have your blood pressure checked every year. You should have your blood pressure measured twice-once when you are at a hospital or clinic, and once when you are not at a hospital or clinic. Record the average of the two measurements. To check your blood pressure when you are not at a hospital or clinic, you can use: ? An automated blood pressure machine at a pharmacy. ? A home blood pressure monitor.  If you are between 42 years and 16 years old, ask your health care provider if you should take aspirin to prevent strokes.  Have regular diabetes screenings. This involves taking a blood sample to check your fasting blood sugar level. ? If you are at a normal weight and have a low risk for  diabetes, have this test once every three years after 67 years of age. ? If you are overweight and have a high risk for diabetes, consider being tested at a younger age or more often. Preventing infection Hepatitis B  If you have a higher risk for hepatitis B, you should be screened for this virus. You are considered at high risk for hepatitis B if: ? You were born in a country where hepatitis B is common. Ask your health care provider which countries are considered high risk. ? Your parents were born in a high-risk country, and you have not been immunized against hepatitis B (hepatitis B vaccine). ? You have HIV or AIDS. ? You use needles to inject street drugs. ? You live with someone who has hepatitis B. ? You have had sex with someone who has hepatitis B. ? You get hemodialysis treatment. ? You take certain medicines for conditions, including cancer, organ transplantation, and autoimmune conditions. Hepatitis C  Blood testing is recommended for: ? Everyone born from [birth year] through 1964. ? Anyone with known risk factors for hepatitis C. Sexually transmitted infections (STIs)  You should be screened for sexually transmitted infections (STIs) including gonorrhea and chlamydia if: ? You are sexually active and are younger than 67 years of age. ? You are older than 67 years of age and your health care provider tells you that you are at risk for this type of infection. ? Your sexual activity has changed since you were last screened and you are at an increased risk for chlamydia or gonorrhea. Ask your health care provider if you are at risk.  If you do not have HIV, but are at risk, it may be recommended that you take a prescription medicine daily to prevent HIV infection. This is called pre-exposure prophylaxis (PrEP). You are considered at risk if: ? You are sexually active and do not regularly use condoms or know the HIV status of your partner(s). ? You take drugs by injection. ? You are  sexually active with a partner who has HIV. Talk with your health care provider about whether you are at high risk of being infected with HIV. If you choose to begin PrEP, you should first be tested for HIV. You should then be tested every 3 months for as long as you are taking PrEP. Pregnancy  If you are premenopausal and you may become pregnant, ask your health care provider about preconception counseling.  If you may become pregnant, take  400 to 800 micrograms (mcg) of folic acid every day.  If you want to prevent pregnancy, talk to your health care provider about birth control (contraception). Osteoporosis and menopause  Osteoporosis is a disease in which the bones lose minerals and strength with aging. This can result in serious bone fractures. Your risk for osteoporosis can be identified using a bone density scan.  If you are 29 years of age or older, or if you are at risk for osteoporosis and fractures, ask your health care provider if you should be screened.  Ask your health care provider whether you should take a calcium or vitamin D supplement to lower your risk for osteoporosis.  Menopause may have certain physical symptoms and risks.  Hormone replacement therapy may reduce some of these symptoms and risks. Talk to your health care provider about whether hormone replacement therapy is right for you. Follow these instructions at home:  Schedule regular health, dental, and eye exams.  Stay current with your immunizations.  Do not use any tobacco products including cigarettes, chewing tobacco, or electronic cigarettes.  If you are pregnant, do not drink alcohol.  If you are breastfeeding, limit how much and how often you drink alcohol.  Limit alcohol intake to no more than 1 drink per day for nonpregnant women. One drink equals 12 ounces of beer, 5 ounces of wine, or 1 ounces of hard liquor.  Do not use street drugs.  Do not share needles.  Ask your health care  provider for help if you need support or information about quitting drugs.  Tell your health care provider if you often feel depressed.  Tell your health care provider if you have ever been abused or do not feel safe at home. This information is not intended to replace advice given to you by your health care provider. Make sure you discuss any questions you have with your health care provider. Document Released: 06/27/2011 Document Revised: 05/19/2016 Document Reviewed: 09/15/2015 Elsevier Interactive Patient Education  2019 [employer].

## 2019-01-31 NOTE — Assessment & Plan Note (Signed)
Flu shot up to date. Pneumonia declines. Shingrix declines. Tetanus declines. Colonoscopy up to date. Mammogram up to date, pap smear aged out and dexa up to date. Counseled about sun safety and mole surveillance. Counseled about the dangers of distracted driving. Given 10 year screening recommendations.

## 2019-01-31 NOTE — Assessment & Plan Note (Signed)
Taking protonix 40 mg daily and rare breakthrough symptoms. Talked about diet associated with GERD.

## 2019-01-31 NOTE — Progress Notes (Addendum)
   Subjective:   Patient ID: Sarah Santos, female    DOB: [birth year], 68 y.o.   MRN: [id number]  HPI Here for welcome to medicare visit, no new complaints. Please see A/P for status and treatment of chronic medical problems.   Diet: heart healthy Physical activity: sedentary Depression/mood screen: negative Hearing: intact to whispered voice Visual acuity: grossly normal with lens, performs annual eye exam  ADLs: capable Fall risk: none Home safety: good Cognitive evaluation: intact to orientation, naming, recall and repetition EOL planning: adv directives discussed    Office Visit from 03/02/2018 in [facility]  PHQ-2 Total Score  0      Visual Acuity Screening   Right eye Left eye Both eyes  Without correction:     With correction: 20/15 20/15 20/20     I have personally reviewed and have noted 1. The patient's medical and social history - reviewed today no changes 2. Their use of alcohol, tobacco or illicit drugs 3. Their current medications and supplements 4. The patient's functional ability including ADL's, fall risks, home safety risks and hearing or visual impairment. 5. Diet and physical activities 6. Evidence for depression or mood disorders 7. Care team reviewed and updated (available in snapshot)  Review of Systems  Constitutional: Negative.   HENT: Negative.   Eyes: Negative.   Respiratory: Negative for cough, chest tightness and shortness of breath.   Cardiovascular: Negative for chest pain, palpitations and leg swelling.  Gastrointestinal: Negative for abdominal distention, abdominal pain, constipation, diarrhea, nausea and vomiting.  Musculoskeletal: Positive for arthralgias.  Skin: Negative.   Neurological: Negative.   Psychiatric/Behavioral: Negative.     Objective:  Physical Exam Constitutional:      Appearance: She is well-developed. She is obese.  HENT:     Head: Normocephalic and atraumatic.  Neck:   Musculoskeletal: Normal range of motion.  Cardiovascular:     Rate and Rhythm: Normal rate and regular rhythm.  Pulmonary:     Effort: Pulmonary effort is normal. No respiratory distress.     Breath sounds: Normal breath sounds. No wheezing or rales.  Abdominal:     General: Bowel sounds are normal. There is no distension.     Palpations: Abdomen is soft.     Tenderness: There is no abdominal tenderness. There is no rebound.  Skin:    General: Skin is warm and dry.  Neurological:     Mental Status: She is alert and oriented to person, place, and time.     Coordination: Coordination normal.     Vitals:   01/31/19 1029  BP: 116/70  Pulse: 64  Temp: 98.3 F (36.8 C)  TempSrc: Oral  SpO2: 99%  Weight: 197 lb (89.4 kg)  Height: 5' 0.75" (1.543 m)    Assessment & Plan:

## 2019-01-31 NOTE — Assessment & Plan Note (Signed)
BP at goal on losartan/hctz and checking CMP and adjust as needed.

## 2019-01-31 NOTE — Assessment & Plan Note (Signed)
Doing well on CPAP

## 2019-02-07 ENCOUNTER — Ambulatory Visit
Admission: RE | Admit: 2019-02-07 | Discharge: 2019-02-07 | Disposition: A | Discharge status: Home / Self Care | Payer: Medicare HMO | Source: Ambulatory Visit | Attending: Obstetrics & Gynecology | Admitting: Obstetrics & Gynecology

## 2019-02-07 DIAGNOSIS — Z1231 Encounter for screening mammogram for malignant neoplasm of breast: Secondary | ICD-10-CM

## 2019-03-20 ENCOUNTER — Other Ambulatory Visit: Payer: Self-pay | Admitting: Internal Medicine

## 2019-06-13 ENCOUNTER — Other Ambulatory Visit: Payer: Self-pay | Admitting: Internal Medicine

## 2019-06-13 NOTE — Telephone Encounter (Signed)
Is patient supposed to be on losartan and losartan-hctz?

## 2019-07-16 NOTE — Progress Notes (Signed)
Subjective:    Patient ID: Sarah Santos, female    DOB: Dec 09, 1951, 68 y.o.   MRN: [id number]  HPI The patient is here for an acute visit.  SOB with exertion:  If she walks or does mild exercise she will get SOB.  She has gained weight and knows that may be contributing.  She is started to increase her walking and is trying to lose the weight.  Her family has told her she was short of breath while on the phone with them 1-2 months ago and that is when she really started to notice this.  She thought it might be her allergies were the humidity, but she will feel it outside or inside.     The other day she walked two miles.  She was doing something around her the house and sat down and had sharp pain in her chest that radiated to her back.  It felt like a muscle spasm and she had to stretch out to help it go away - it lasted 1-2 minutes.  It was a sharp pain, pulling sensation and burning sensation.  She was not SOB at that time.  She has not had other episodes of chest pain.  She denies any chest pain while walking for exercise.  Left knee pain:  She has had issues with her knee and was told her to use a brace and topical cream.  She is unsure if she was told that she had arthritis or not.  Recently she has found that if she does not wear the knee brace she loses strength in her left lower lateral leg - it feels like it is going to give out.     Medications and allergies reviewed with patient and updated if appropriate.  Patient Active Problem List   Diagnosis Date Noted  . Menopausal symptom 05/26/2018  . OSA on CPAP 05/26/2017  . Routine general medical examination at a health care facility 06/13/2015  . 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 Anemia   . Hypertension 04/04/2012  . Primary hyperparathyroidism ([place]) 01/25/2011  . GERD 01/19/2011  . MICROSCOPIC HEMATURIA 07/02/2009  . Dyslipidemia 05/08/2009  . ALLERGIC RHINITIS 05/08/2009   . Personal history of colonic polyps 01/04/2008    Current Outpatient Medications on File Prior to Visit  Medication Sig Dispense Refill  . fluticasone (FLONASE) 50 MCG/ACT nasal spray Place 2 sprays into both nostrils daily. 16 g 6  . loratadine (CLARITIN) 10 MG tablet Take 1 tablet (10 mg total) by mouth daily. 30 tablet 0  . losartan (COZAAR) 50 MG tablet TAKE 1 TABLET EVERY DAY 90 tablet 0  . losartan-hydrochlorothiazide (HYZAAR) 50-12.5 MG tablet TAKE 1 TABLET EVERY DAY 90 tablet 0  . pantoprazole (PROTONIX) 40 MG tablet TAKE 1 TABLET (40 MG TOTAL) BY MOUTH DAILY. 90 tablet 1  . simvastatin (ZOCOR) 20 MG tablet TAKE 1 TABLET BY MOUTH DAILY AT 6 PM. 90 tablet 1   No current facility-administered medications on file prior to visit.     Past Medical History:  Diagnosis Date  . ALLERGIC RHINITIS   . 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 GERD   . HEADACHE, CHRONIC   . Heart murmur   . Hematuria, microscopic   . History of anemia   . Hx of adenomatous colonic polyps   . Hypercalcemia   . HYPERLIPIDEMIA   . Hypertension   . Personal  history of colonic polyps 01/04/2008  . Primary hyperparathyroidism ([place])   . Sleep apnea     Past Surgical History:  Procedure Laterality Date  . COLONOSCOPY W/ POLYPECTOMY  2009, 08/18/11   2009:adenomas, largest 4 cm, rectal carcinoid, hemorrhoids  . OVARIAN CYST REMOVAL  70's  . TUBAL LIGATION      Social History   Socioeconomic History  . Marital status: Divorced    Spouse name: Not on file  . Number of children: Not on file  . Years of education: Not on file  . Highest education level: Not on file  Occupational History  . Not on file  Social Needs  . Financial resource strain: Not on file  . Food insecurity    Worry: Not on file    Inability: Not on file  . Transportation needs    Medical: Not on file    Non-medical: Not on file  Tobacco Use  . Smoking status: Former Smoker    Quit date:  03/03/1991    Years since quitting: 28.3  . Smokeless tobacco: Never Used  Substance and Sexual Activity  . Alcohol use: No  . Drug use: No  . Sexual activity: Not Currently    Birth control/protection: Post-menopausal, Surgical  Lifestyle  . Physical activity    Days per week: Not on file    Minutes per session: Not on file  . Stress: Not on file  Relationships  . Social Herbalist on phone: Not on file    Gets together: Not on file    Attends religious service: Not on file    Active member of club or organization: Not on file    Attends meetings of clubs or organizations: Not on file    Relationship status: Not on file  Other Topics Concern  . 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 Heart disease Mother 108       PCI  . Pulmonary embolism Mother   . Cancer Other        Lung  . Heart disease Other        Coronary Atery Disease  . Stomach cancer Paternal Aunt   . Stroke Father   . Diabetes Brother   . Hyperlipidemia Brother   . Heart disease Brother   . Colon cancer Neg Hx   . Colon polyps Neg Hx   . Other Neg Hx        hyperparathyroidism  . Esophageal cancer Neg Hx   . Rectal cancer Neg Hx     Review of Systems  Constitutional: Negative for chills and fever.  Respiratory: Positive for cough (allergies related) and shortness of breath. Negative for wheezing.   Cardiovascular: Positive for chest pain and palpitations (occ when at rest). Negative for leg swelling.  Gastrointestinal:       Gerd controlled, occ gerd  Genitourinary: Positive for urgency.  Musculoskeletal: Positive for arthralgias.  Neurological: Positive for headaches. Negative for dizziness and light-headedness.       Objective:   Vitals:   07/17/19 0845  BP: 136/68  Pulse: 94  Resp: 18  Temp: 98.1 F (36.7 C)  SpO2: 99%   BP Readings from Last 3 Encounters:  07/17/19 136/68   01/31/19 116/70  01/11/19 128/70   Wt Readings from Last 3 Encounters:  07/17/19 208 lb (94.3 kg)  01/31/19 197 lb (89.4  kg)  01/11/19 196 lb 6.4 oz (89.1 kg)   Body mass index is 39.63 kg/m.   Physical Exam    Constitutional: Appears well-developed and well-nourished. No distress.  HENT:  Head: Normocephalic and atraumatic.  Neck: Neck supple. No tracheal deviation present. No thyromegaly present.  No cervical lymphadenopathy Cardiovascular: Normal rate, regular rhythm and normal heart sounds.   No murmur heard. No carotid bruit .  No edema Pulmonary/Chest: Effort normal and breath sounds normal. No respiratory distress. No has no wheezes. No rales.  Skin: Skin is warm and dry. Not diaphoretic.  Psychiatric: Normal mood and affect. Behavior is normal.       Assessment & Plan:    See Problem List for Assessment and Plan of chronic medical problems.

## 2019-07-17 ENCOUNTER — Other Ambulatory Visit: Payer: Self-pay

## 2019-07-17 ENCOUNTER — Ambulatory Visit (INDEPENDENT_AMBULATORY_CARE_PROVIDER_SITE_OTHER)
Admission: RE | Admit: 2019-07-17 | Discharge: 2019-07-17 | Disposition: A | Discharge status: Home / Self Care | Payer: Medicare HMO | Source: Ambulatory Visit | Attending: Internal Medicine | Admitting: Internal Medicine

## 2019-07-17 ENCOUNTER — Encounter: Payer: Self-pay | Admitting: Internal Medicine

## 2019-07-17 ENCOUNTER — Other Ambulatory Visit (INDEPENDENT_AMBULATORY_CARE_PROVIDER_SITE_OTHER): Payer: Medicare HMO

## 2019-07-17 ENCOUNTER — Ambulatory Visit (INDEPENDENT_AMBULATORY_CARE_PROVIDER_SITE_OTHER): Payer: Medicare HMO | Admitting: Internal Medicine

## 2019-07-17 VITALS — BP 136/68 | HR 94 | Temp 98.1°F | Resp 18 | Ht 60.75 in | Wt 208.0 lb

## 2019-07-17 DIAGNOSIS — R0789 Other chest pain: Secondary | ICD-10-CM

## 2019-07-17 DIAGNOSIS — R0609 Other forms of dyspnea: Secondary | ICD-10-CM

## 2019-07-17 DIAGNOSIS — G8929 Other chronic pain: Secondary | ICD-10-CM

## 2019-07-17 DIAGNOSIS — R0600 Dyspnea, unspecified: Secondary | ICD-10-CM

## 2019-07-17 DIAGNOSIS — M25562 Pain in left knee: Secondary | ICD-10-CM | POA: Diagnosis not present

## 2019-07-17 HISTORY — DX: Other chronic pain: G89.29

## 2019-07-17 HISTORY — DX: Dyspnea, unspecified: R06.00

## 2019-07-17 HISTORY — DX: Other chest pain: R07.89

## 2019-07-17 HISTORY — DX: Other forms of dyspnea: R06.09

## 2019-07-17 LAB — COMPREHENSIVE METABOLIC PANEL
ALT: 16 U/L (ref 0–35)
AST: 16 U/L (ref 0–37)
Albumin: 4.4 g/dL (ref 3.5–5.2)
Alkaline Phosphatase: 98 U/L (ref 39–117)
BUN: 20 mg/dL (ref 6–23)
CO2: 25 mEq/L (ref 19–32)
Calcium: 10.8 mg/dL — ABNORMAL HIGH (ref 8.4–10.5)
Chloride: 104 mEq/L (ref 96–112)
Creatinine, Ser: 1.1 mg/dL (ref 0.40–1.20)
GFR: 59.87 mL/min — ABNORMAL LOW (ref >60.00)
Glucose, Bld: 114 mg/dL — ABNORMAL HIGH (ref 70–99)
Potassium: 3.3 mEq/L — ABNORMAL LOW (ref 3.5–5.1)
Sodium: 138 mEq/L (ref 135–145)
Total Bilirubin: 0.4 mg/dL (ref 0.2–1.2)
Total Protein: 7.9 g/dL (ref 6.0–8.3)

## 2019-07-17 LAB — CBC WITH DIFFERENTIAL/PLATELET
Basophils Absolute: 0 10*3/uL (ref 0.0–0.1)
Basophils Relative: 0.5 % (ref 0.0–3.0)
Eosinophils Absolute: 0.2 10*3/uL (ref 0.0–0.7)
Eosinophils Relative: 2.8 % (ref 0.0–5.0)
HCT: 34.3 % — ABNORMAL LOW (ref 36.0–46.0)
Hemoglobin: 10.9 g/dL — ABNORMAL LOW (ref 12.0–15.0)
Lymphocytes Relative: 39.9 % (ref 12.0–46.0)
Lymphs Abs: 2.7 10*3/uL (ref 0.7–4.0)
MCHC: 31.8 g/dL (ref 30.0–36.0)
MCV: 80.6 fl (ref 78.0–100.0)
Monocytes Absolute: 0.6 10*3/uL (ref 0.1–1.0)
Monocytes Relative: 8.3 % (ref 3.0–12.0)
Neutro Abs: 3.3 10*3/uL (ref 1.4–7.7)
Neutrophils Relative %: 48.5 % (ref 43.0–77.0)
Platelets: 273 10*3/uL (ref 150.0–400.0)
RBC: 4.25 Mil/uL (ref 3.87–5.11)
RDW: 14.4 % (ref 11.5–15.5)
WBC: 6.8 10*3/uL (ref 4.0–10.5)

## 2019-07-17 NOTE — Assessment & Plan Note (Signed)
Likely arthritis Wearing a brace and she will continue to do so She does not wear the brace she does experience left lower extremity weakness like it is going to give out Continues to walk Will refer to sports medicine for further evaluation

## 2019-07-17 NOTE — Patient Instructions (Addendum)
An EKG was done today.    Have blood work and a chest x-ray today.  Tests ordered today. Your results will be released to [place] (or called to you) after review.  If any changes need to be made, you will be notified at that same time.    Medications reviewed and updated.  Changes include :   none    A referral was ordered for cardiology and sports medicine

## 2019-07-17 NOTE — Assessment & Plan Note (Signed)
1 episode of atypical chest pain that lasted 1-2 minutes-probable GI in nature Will be seen cardiology for shortness of breath with exertion EKG today as above-no change compared to prior Advised her to let us know if she does have another episode of chest pain prior to seeing cardiology

## 2019-07-17 NOTE — Assessment & Plan Note (Signed)
In the past 1-2 months she has been experiencing shortness of breath on exertion-occurs inside and outside Her family and friends have noticed it while she talks on the phone with them She has gained weight and knows that may be contributing She is walking for exercise and working on weight loss One episode of atypical chest pain, occasional palpitations more at rest EKG today: Normal sinus rhythm at 80 bpm, nonspecific T wave abnormality, otherwise normal.  No change compared to previous EKG from 04/2018 CBC, CMP Chest x-ray Will refer to cardiology to rule out cardiac cause

## 2019-07-18 ENCOUNTER — Other Ambulatory Visit: Payer: Self-pay | Admitting: Internal Medicine

## 2019-07-18 DIAGNOSIS — R911 Solitary pulmonary nodule: Secondary | ICD-10-CM

## 2019-07-26 ENCOUNTER — Other Ambulatory Visit: Payer: Self-pay

## 2019-07-26 ENCOUNTER — Ambulatory Visit (INDEPENDENT_AMBULATORY_CARE_PROVIDER_SITE_OTHER)
Admission: RE | Admit: 2019-07-26 | Discharge: 2019-07-26 | Disposition: A | Discharge status: Home / Self Care | Payer: Medicare HMO | Source: Ambulatory Visit | Attending: Internal Medicine | Admitting: Internal Medicine

## 2019-07-26 DIAGNOSIS — R918 Other nonspecific abnormal finding of lung field: Secondary | ICD-10-CM | POA: Diagnosis not present

## 2019-07-26 DIAGNOSIS — R911 Solitary pulmonary nodule: Secondary | ICD-10-CM

## 2019-08-02 ENCOUNTER — Other Ambulatory Visit: Payer: Self-pay

## 2019-08-02 ENCOUNTER — Encounter: Payer: Self-pay | Admitting: Family Medicine

## 2019-08-02 ENCOUNTER — Ambulatory Visit: Payer: Self-pay

## 2019-08-02 ENCOUNTER — Ambulatory Visit (INDEPENDENT_AMBULATORY_CARE_PROVIDER_SITE_OTHER): Payer: Medicare HMO | Admitting: Family Medicine

## 2019-08-02 ENCOUNTER — Ambulatory Visit (INDEPENDENT_AMBULATORY_CARE_PROVIDER_SITE_OTHER)
Admission: RE | Admit: 2019-08-02 | Discharge: 2019-08-02 | Disposition: A | Discharge status: Home / Self Care | Payer: Medicare HMO | Source: Ambulatory Visit | Attending: Family Medicine | Admitting: Family Medicine

## 2019-08-02 VITALS — BP 110/82 | HR 81 | Ht 60.0 in | Wt 208.0 lb

## 2019-08-02 DIAGNOSIS — M25562 Pain in left knee: Secondary | ICD-10-CM

## 2019-08-02 DIAGNOSIS — G8929 Other chronic pain: Secondary | ICD-10-CM

## 2019-08-02 DIAGNOSIS — M1712 Unilateral primary osteoarthritis, left knee: Secondary | ICD-10-CM

## 2019-08-02 DIAGNOSIS — M170 Bilateral primary osteoarthritis of knee: Secondary | ICD-10-CM | POA: Diagnosis not present

## 2019-08-02 HISTORY — DX: Unilateral primary osteoarthritis, left knee: M17.12

## 2019-08-02 NOTE — Patient Instructions (Addendum)
Good to see you.  Sarah Santos will call you Ice 20 minutes 2 times daily. Usually after activity and before bed. Exercises 3 times a week.  Voltaren gel  Turmeric 500mg  daily  Tart cherry extract 1200mg  at night Vitamin D 2000 IU daily  See me again in 5-6 weeks

## 2019-08-02 NOTE — Progress Notes (Signed)
[facility] [address], [place] [address] Phone: [phone] Subjective:   I Sarah Santos am serving as a Education administrator for Dr. Hulan Saas.  CC: Left knee pain  NID:[id number]  Sarah Santos is a 68 y.o. female coming in with complaint of left knee pain. Pain radiates down her leg. Wearing a knee brace. States she loses strength in the left leg.   Onset- Chronic  Character- throbbing Aggravating factors- flexion, first thing in the morning  Therapies tried- cream, tiger balm  Severity-5 out of 10     Past Medical History:  Diagnosis Date  . ALLERGIC RHINITIS   . 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 GERD   . HEADACHE, CHRONIC   . Heart murmur   . Hematuria, microscopic   . History of anemia   . Hx of adenomatous colonic polyps   . Hypercalcemia   . HYPERLIPIDEMIA   . Hypertension   . Personal history of colonic polyps 01/04/2008  . Primary hyperparathyroidism ([place])   . Sleep apnea    Past Surgical History:  Procedure Laterality Date  . COLONOSCOPY W/ POLYPECTOMY  2009, 08/18/11   2009:adenomas, largest 4 cm, rectal carcinoid, hemorrhoids  . OVARIAN CYST REMOVAL  70's  . TUBAL LIGATION     Social History   Socioeconomic History  . Marital status: Divorced    Spouse name: Not on file  . Number of children: Not on file  . Years of education: Not on file  . Highest education level: Not on file  Occupational History  . Not on file  Social Needs  . Financial resource strain: Not on file  . Food insecurity    Worry: Not on file    Inability: Not on file  . Transportation needs    Medical: Not on file    Non-medical: Not on file  Tobacco Use  . Smoking status: Former Smoker    Quit date: 03/03/1991    Years since quitting: 28.4  . Smokeless tobacco: Never Used  Substance and Sexual Activity  . Alcohol use: No  . Drug use: No  . Sexual activity: Not Currently    Birth  control/protection: Post-menopausal, Surgical  Lifestyle  . Physical activity    Days per week: Not on file    Minutes per session: Not on file  . Stress: Not on file  Relationships  . Social Herbalist on phone: Not on file    Gets together: Not on file    Attends religious service: Not on file    Active member of club or organization: Not on file    Attends meetings of clubs or organizations: Not on file    Relationship status: Not on file  Other Topics Concern  . 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 Heart disease Mother 22       PCI  . Pulmonary embolism Mother   . Cancer Other        Lung  . Heart disease Other        Coronary Atery Disease  . Stomach cancer Paternal Aunt   . Stroke Father   . Diabetes Brother   . Hyperlipidemia Brother   . Heart disease Brother   . Colon cancer Neg Hx   . Colon polyps Neg  Hx   . Other Neg Hx        hyperparathyroidism  . Esophageal cancer Neg Hx   . Rectal cancer Neg Hx      Current Outpatient Medications (Cardiovascular):  .  losartan (COZAAR) 50 MG tablet, TAKE 1 TABLET EVERY DAY .  losartan-hydrochlorothiazide (HYZAAR) 50-12.5 MG tablet, TAKE 1 TABLET EVERY DAY .  simvastatin (ZOCOR) 20 MG tablet, TAKE 1 TABLET BY MOUTH DAILY AT 6 PM.  Current Outpatient Medications (Respiratory):  .  fluticasone (FLONASE) 50 MCG/ACT nasal spray, Place 2 sprays into both nostrils daily. Marland Kitchen  loratadine (CLARITIN) 10 MG tablet, Take 1 tablet (10 mg total) by mouth daily.    Current Outpatient Medications (Other):  .  pantoprazole (PROTONIX) 40 MG tablet, TAKE 1 TABLET (40 MG TOTAL) BY MOUTH DAILY.    Past medical history, social, surgical and family history all reviewed in electronic medical record.  No pertanent information unless stated regarding to the chief complaint.   Review of Systems:  No headache,  visual changes, nausea, vomiting, diarrhea, constipation, dizziness, abdominal pain, skin rash, fevers, chills, night sweats, weight loss, swollen lymph nodes, body aches, joint swelling,  chest pain, shortness of breath, mood changes.  Positive muscle aches  Objective  Blood pressure 110/82, pulse 81, height 5' (1.524 m), weight 208 lb (94.3 kg), SpO2 98 %.    General: No apparent distress alert and oriented x3 mood and affect normal, dressed appropriately.  HEENT: Pupils equal, extraocular movements intact  Respiratory: Patient's speak in full sentences and does not appear short of breath  Cardiovascular trace lower extremity edema, non tender, no erythema  Skin: Warm dry intact with no signs of infection or rash on extremities or on axial skeleton.  Abdomen: Soft nontender  Neuro: Cranial nerves II through XII are intact, neurovascularly intact in all extremities with 2+ DTRs and 2+ pulses.  Lymph: No lymphadenopathy of posterior or anterior cervical chain or axillae bilaterally.  Gait antalgic gait MSK:  tender with full range of motion and good stability and symmetric strength and tone of shoulders, elbows, wrist, hip, knee and ankles bilaterally.  Left knee exam has arthritic changes with valgus deformity.  Patient does have tenderness to palpation over the patellofemoral and the medial joint space.  Lacks last 5 degrees of extension.  Neurovascular intact distally.  Mild instability with valgus force  Limited musculoskeletal ultrasound was performed and interpreted by Lyndal Pulley   Limited ultrasound of patient's left knee shows the patient does have moderate to severe narrowing of the medial joint space with a degenerative meniscal tear noted but no significant displacement.  Patient also has severe patellofemoral arthritis noted. Impression: Knee arthritis    Impression and Recommendations:     This case required medical decision making of moderate complexity. The above  documentation has been reviewed and is accurate and complete Lyndal Pulley, DO       Note: This dictation was prepared with Dragon dictation along with smaller phrase technology. Any transcriptional errors that result from this process are unintentional.

## 2019-08-02 NOTE — Assessment & Plan Note (Signed)
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  Moderate to severe in nature.  Does have some instability and abnormal thigh to calf ratio.  Custom OA stability brace would be needed.  Patient was comfortable an injection, topical anti-inflammatories and icing regimen.  Patient does have calf tenderness and will rule out clot with a Doppler doppler but I do think it is a

## 2019-08-05 ENCOUNTER — Other Ambulatory Visit: Payer: Self-pay

## 2019-08-05 ENCOUNTER — Ambulatory Visit (HOSPITAL_COMMUNITY)
Admission: RE | Admit: 2019-08-05 | Discharge: 2019-08-05 | Disposition: A | Discharge status: Home / Self Care | Payer: Medicare HMO | Source: Ambulatory Visit | Attending: Cardiology | Admitting: Cardiology

## 2019-08-05 DIAGNOSIS — G8929 Other chronic pain: Secondary | ICD-10-CM | POA: Diagnosis not present

## 2019-08-05 DIAGNOSIS — E785 Hyperlipidemia, unspecified: Secondary | ICD-10-CM | POA: Insufficient documentation

## 2019-08-05 DIAGNOSIS — M25562 Pain in left knee: Secondary | ICD-10-CM | POA: Insufficient documentation

## 2019-08-05 NOTE — Progress Notes (Signed)
Cardiology Office Note:    Date:  08/06/2019   ID:  Sarah Santos, DOB [birth year], MRN [id number]  PCP:  Hoyt Koch, MD  Cardiologist:  Shirlee More, MD   Referring MD: Binnie Rail, MD  ASSESSMENT:    1. Dyspnea on exertion   2. Essential hypertension   3. Mixed hyperlipidemia   4. Hypercalcemia    PLAN:    In order of problems listed above:  1. DOE - Reports dyspnea on exertion. Her family members have noticed onset recently. Tells me she lost and then regained 40lbs over the last 1-2 years and this is what she attributes her DOE. No chest pain, low concern angina. On exam BLE nonpitting edema. Given PMH HTN, OSA, history of tobacco use, FH of heart disease - will obtain echocardiogram. Etiology HF vs deconditioning.   2. HTN - BP well controlled today on exam. Continue present anti-hypertensive regimen. EKG from Dr. Frederik Pear office shows concern for LVH on EKG.   3. HLD - Follows with her PCP. 01/31/19 total cholesterol 207, LDL 128, HDL 61.9. Normal liver function 07/17/19. Continue present statin.   4. Hypercalcemia - Reports previous history of hypercalcemia on stronger HCTZ dose. Has since resolved. Careful titration of K sparing diuretics.   5. Palpitations - Long standing history. "Happened for years" per her report. Not associated with SOB nor chest pain. Self resolve after a few seonds. Reports they do not bother her.   6. OSA on CPAP - Compliant with her CPAP. Encouraged to continue using.   Next appointment: 6 weeks   Medication Adjustments/Labs and Tests Ordered: Current medicines are reviewed at length with the patient today.  Concerns regarding medicines are outlined above.  Orders Placed This Encounter  Procedures  . ECHOCARDIOGRAM COMPLETE   No orders of the defined types were placed in this encounter.    Chief Complaint  Patient presents with  . Shortness of Breath    dyspnea on exertion    History of Present Illness:     Sarah Santos is a 67 y.o. female who is being seen today for the evaluation of dyspnea on exertion at the request of Burns, Claudina Lick, MD. She has PMH notable for HTN, GERD, OSA on CPAP, DLD, anemia, allergic rhinitis. Seen by Dr. Quay Burow 07/17/19 and noted L knee pain, referred to orthopedics, along with episode of atypical chest pain and SOB on exertion for which she was referred to this office.   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 Her father is a previous patient of mine. Tells me her dyspnea on exertion is somewhat new. Her family members have noticed it with activity such as walking or talking a long time on the phone. She does try to exercise regularly by walking or participating in spin classes. No SOB at rest. Has never had echocardiogram that she recalls.   She has a notable family history for heart disease. She tells me she used to smoke, but quit in the 80s. She has high healthcare literacy and takes good care of herself.   Tells me her HTN was diagnosed when she was 68yo. Reports BP is well controlled. Was previously on a higher dose of HCTZ, but had to be decreased due to hypercalcemia. Reports some increase in her LE edema since decreasing the HCTZ.   She denies chest pain. Tells me she has had palpitations for "a long time" they are self limiting, last a few seconds, not associated with SOB nor  chest pain. Reports they do not bother her.   Past Medical History:  Diagnosis Date  . ALLERGIC RHINITIS   . 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 GERD   . HEADACHE, CHRONIC   . Heart murmur   . Hematuria, microscopic   . History of anemia   . Hx of adenomatous colonic polyps   . Hypercalcemia   . HYPERLIPIDEMIA   . Hypertension   . Personal history of colonic polyps 01/04/2008  . Primary hyperparathyroidism ([place])   . Sleep apnea     Past Surgical History:  Procedure Laterality Date  . carpal tunnel Bilateral   .  COLONOSCOPY W/ POLYPECTOMY  2009, 08/18/11   2009:adenomas, largest 4 cm, rectal carcinoid, hemorrhoids  . OVARIAN CYST REMOVAL  70's  . TUBAL LIGATION      Current Medications: Current Meds  Medication Sig  . loratadine (CLARITIN) 10 MG tablet Take 10 mg by mouth daily as needed for allergies.  Marland Kitchen losartan (COZAAR) 50 MG tablet TAKE 1 TABLET EVERY DAY  . losartan-hydrochlorothiazide (HYZAAR) 50-12.5 MG tablet TAKE 1 TABLET EVERY DAY  . pantoprazole (PROTONIX) 40 MG tablet TAKE 1 TABLET (40 MG TOTAL) BY MOUTH DAILY.  . simvastatin (ZOCOR) 20 MG tablet Take 20 mg by mouth 2 (two) times a week.     Allergies:   Patient has no known allergies.   Social History   Socioeconomic History  . Marital status: Divorced    Spouse name: Not on file  . Number of children: Not on file  . Years of education: Not on file  . Highest education level: Not on file  Occupational History  . Not on file  Social Needs  . Financial resource strain: Not on file  . Food insecurity    Worry: Not on file    Inability: Not on file  . Transportation needs    Medical: Not on file    Non-medical: Not on file  Tobacco Use  . Smoking status: Former Smoker    Types: Cigarettes    Quit date: 1981    Years since quitting: 39.6  . Smokeless tobacco: Never Used  Substance and Sexual Activity  . Alcohol use: No  . Drug use: No  . Sexual activity: Not Currently    Birth control/protection: Post-menopausal, Surgical  Lifestyle  . Physical activity    Days per week: Not on file    Minutes per session: Not on file  . Stress: Not on file  Relationships  . Social Herbalist on phone: Not on file    Gets together: Not on file    Attends religious service: Not on file    Active member of club or organization: Not on file    Attends meetings of clubs or organizations: Not on file    Relationship status: Not on file  Other Topics Concern  . 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 There is no history of Colon cancer, Colon polyps, Other, Esophageal cancer, or Rectal cancer.  ROS:   Review of Systems  Constitution: Negative for chills, fever and malaise/fatigue.  Cardiovascular: Positive for dyspnea on exertion and leg swelling. Negative for chest pain, irregular heartbeat, near-syncope, palpitations and syncope.  Respiratory: Negative for cough and wheezing. Sleep disturbances due to breathing: wears cpap.   Gastrointestinal: Negative for nausea and vomiting.  Neurological: Negative for dizziness, light-headedness and weakness.   Please see the history of present illness.     All other systems reviewed and are negative.  EKGs/Labs/Other Studies Reviewed:    The following studies were reviewed today:   EKG:  EKG from PCP office shows SR with concern for LVH.  Recent Labs: 07/17/2019: ALT 16; BUN 20; Creatinine, Ser 1.10; Hemoglobin 10.9; Platelets 273.0; Potassium 3.3; Sodium 138  Recent Lipid Panel    Component Value Date/Time   CHOL 207 (H) 01/31/2019 1100   TRIG 85.0 01/31/2019 1100   HDL 61.90 01/31/2019 1100   CHOLHDL 3 01/31/2019 1100   VLDL 17.0 01/31/2019 1100   LDLCALC 128 (H) 01/31/2019 1100   LDLDIRECT 156.9 02/01/2012 1442    Physical Exam:    VS:  BP 122/66 (BP Location: Right Arm, Patient Position: Sitting, Cuff Size: Large)   Pulse 95   Ht 5\' 1"  (1.549 m)   Wt 210 lb 12.8 oz (95.6 kg)   SpO2 97%   BMI 39.83 kg/m     Wt Readings from Last 3 Encounters:  08/06/19 210 lb 12.8 oz (95.6 kg)  08/02/19 208 lb (94.3 kg)  07/17/19 208 lb (94.3 kg)     GEN:  Well  nourished, well developed in no acute distress HEENT: Normal NECK: No JVD; No carotid bruits LYMPHATICS: No lymphadenopathy CARDIAC: RRR, no murmurs, rubs, gallops RESPIRATORY:  Clear to auscultation without rales, wheezing or rhonchi  ABDOMEN: Soft, non-tender, non-distended MUSCULOSKELETAL:  BLE non pitting edema; No deformity  SKIN: Warm and dry NEUROLOGIC:  Alert and oriented x 3 PSYCHIATRIC:  Normal affect     Signed, Shirlee More, MD  08/06/2019 3:21 PM    [place]

## 2019-08-06 ENCOUNTER — Ambulatory Visit (INDEPENDENT_AMBULATORY_CARE_PROVIDER_SITE_OTHER): Payer: Medicare HMO | Admitting: Cardiology

## 2019-08-06 ENCOUNTER — Encounter: Payer: Self-pay | Admitting: Cardiology

## 2019-08-06 VITALS — BP 122/66 | HR 95 | Ht 61.0 in | Wt 210.8 lb

## 2019-08-06 DIAGNOSIS — I10 Essential (primary) hypertension: Secondary | ICD-10-CM | POA: Diagnosis not present

## 2019-08-06 DIAGNOSIS — R0600 Dyspnea, unspecified: Secondary | ICD-10-CM

## 2019-08-06 DIAGNOSIS — E782 Mixed hyperlipidemia: Secondary | ICD-10-CM

## 2019-08-06 DIAGNOSIS — R0609 Other forms of dyspnea: Secondary | ICD-10-CM | POA: Diagnosis not present

## 2019-08-06 NOTE — Patient Instructions (Signed)
Medication Instructions:  No changes to your medications today.   If you need a refill on your cardiac medications before your next appointment, please call your pharmacy.   Lab work: No labs today.   If you have labs (blood work) drawn today and your tests are completely normal, you will receive your results only by: Marland Kitchen MyChart Message (if you have MyChart) OR . A paper copy in the mail If you have any lab test that is abnormal or we need to change your treatment, we will call you to review the results.  Testing/Procedures: Your physician has requested that you have an echocardiogram. Echocardiography is a painless test that uses sound waves to create images of your heart. It provides your doctor with information about the size and shape of your heart and how well your heart's chambers and valves are working. This procedure takes approximately one hour. There are no restrictions for this procedure.   Follow-Up: At [facility], you and your health needs are our priority.  As part of our continuing mission to provide you with exceptional heart care, we have created designated Provider Care Teams.  These Care Teams include your primary Cardiologist (physician) and Advanced Practice Providers (APPs -  Physician Assistants and Nurse Practitioners) who all work together to provide you with the care you need, when you need it. You will need a follow up appointment in 6 weeks. You may see Shirlee More, MD or another member of our [facility] Provider Team in [place]: Jenne Campus, MD . Jyl Heinz, MD  Any Other Special Instructions Will Be Listed Below (If Applicable).  It was a pleasure to see you today!  Recommend elevating lower extremities to help with swelling.   Recommend compression stockings to help with swelling.   Please keep a log of your blood pressure and bring to your next office visit.

## 2019-08-13 ENCOUNTER — Ambulatory Visit (HOSPITAL_COMMUNITY): Payer: Medicare HMO | Attending: Cardiology

## 2019-08-13 ENCOUNTER — Other Ambulatory Visit: Payer: Self-pay

## 2019-08-13 DIAGNOSIS — R0609 Other forms of dyspnea: Secondary | ICD-10-CM | POA: Diagnosis not present

## 2019-08-13 DIAGNOSIS — R0600 Dyspnea, unspecified: Secondary | ICD-10-CM

## 2019-08-13 DIAGNOSIS — I10 Essential (primary) hypertension: Secondary | ICD-10-CM | POA: Diagnosis not present

## 2019-08-20 DIAGNOSIS — M1712 Unilateral primary osteoarthritis, left knee: Secondary | ICD-10-CM | POA: Diagnosis not present

## 2019-09-08 NOTE — Progress Notes (Signed)
[facility] [address], [place] [address] Phone: [phone] Subjective:   Fontaine No, am serving as a scribe for Dr. Hulan Saas. 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  Moderate to severe in nature.  Does have some instability and abnormal thigh to calf ratio.  Custom OA stability brace would be needed.  Patient was comfortable an injection, topical anti-inflammatories and icing regimen.  Patient does have calf tenderness and will rule out clot with a Doppler doppler but I do think it is a  Update 09/10/2019 Sarah Santos is a 68 y.o. female coming in with complaint of left knee pain. Does feel like she is improving. Has been wearing a knee sleeve for support.   Patient had Doppler that was unremarkable for any DVT  Past Medical History:  Diagnosis Date  . ALLERGIC RHINITIS   . 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 GERD   . HEADACHE, CHRONIC   . Heart murmur   . Hematuria, microscopic   . History of anemia   . Hx of adenomatous colonic polyps   . Hypercalcemia   . HYPERLIPIDEMIA   . Hypertension   . OSA on CPAP   . Personal history of colonic polyps 01/04/2008  . Primary hyperparathyroidism [facility])    Past Surgical History:  Procedure Laterality Date  . carpal tunnel Bilateral   . COLONOSCOPY W/ POLYPECTOMY  2009, 08/18/11   2009:adenomas, largest 4 cm, rectal carcinoid, hemorrhoids  . OVARIAN CYST REMOVAL  70's  . TUBAL LIGATION     Social History   Socioeconomic History  . Marital status: Divorced    Spouse name: Not on file  . Number of children: Not on file  . Years of education: Not on file  . Highest education level: Not on file  Occupational History  . Not on file  Social Needs  . Financial resource strain: Not on file  . Food insecurity    Worry: Not on  file    Inability: Not on file  . Transportation needs    Medical: Not on file    Non-medical: Not on file  Tobacco Use  . Smoking status: Former Smoker    Types: Cigarettes    Quit date: 1981    Years since quitting: 39.7  . Smokeless tobacco: Never Used  Substance and Sexual Activity  . Alcohol use: No  . Drug use: No  . Sexual activity: Not Currently    Birth control/protection: Post-menopausal, Surgical  Lifestyle  . Physical activity    Days per week: Not on file    Minutes per session: Not on file  . Stress: Not on file  Relationships  . Social Herbalist on phone: Not on file    Gets together: Not on file    Attends religious service: Not on file    Active member of club or organization: Not on file    Attends meetings of clubs or organizations: Not on file    Relationship status: Not on file  Other Topics Concern  . 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 Heart disease Mother 14       PCI  .  Pulmonary embolism Mother   . Cancer Other        Lung  . Heart disease Other        Coronary Atery Disease  . Stomach cancer Paternal Aunt   . Stroke Father   . Diabetes Brother   . Hyperlipidemia Brother   . Heart disease Brother   . Colon cancer Neg Hx   . Colon polyps Neg Hx   . Other Neg Hx        hyperparathyroidism  . Esophageal cancer Neg Hx   . Rectal cancer Neg Hx      Current Outpatient Medications (Cardiovascular):  .  losartan (COZAAR) 50 MG tablet, TAKE 1 TABLET EVERY DAY .  losartan-hydrochlorothiazide (HYZAAR) 50-12.5 MG tablet, TAKE 1 TABLET EVERY DAY .  simvastatin (ZOCOR) 20 MG tablet, Take 20 mg by mouth 2 (two) times a week.  Current Outpatient Medications (Respiratory):  .  loratadine (CLARITIN) 10 MG tablet, Take 10 mg by mouth daily as needed for allergies.    Current Outpatient Medications (Other):  .   pantoprazole (PROTONIX) 40 MG tablet, TAKE 1 TABLET (40 MG TOTAL) BY MOUTH DAILY.    Past medical history, social, surgical and family history all reviewed in electronic medical record.  No pertanent information unless stated regarding to the chief complaint.   Review of Systems:  No headache, visual changes, nausea, vomiting, diarrhea, constipation, dizziness, abdominal pain, skin rash, fevers, chills, night sweats, weight loss, swollen lymph nodes, body aches, joint swelling,  chest pain, shortness of breath, mood changes.  Positive muscle aches  Objective  Blood pressure 122/66, pulse 83, height 5\' 1"  (1.549 m), weight 208 lb (94.3 kg), SpO2 98 %.    General: No apparent distress alert and oriented x3 mood and affect normal, dressed appropriately.  HEENT: Pupils equal, extraocular movements intact  Respiratory: Patient's speak in full sentences and does not appear short of breath  Cardiovascular: No lower extremity edema, non tender, no erythema  Skin: Warm dry intact with no signs of infection or rash on extremities or on axial skeleton.  Abdomen: Soft nontender  Neuro: Cranial nerves II through XII are intact, neurovascularly intact in all extremities with 2+ DTRs and 2+ pulses.  Lymph: No lymphadenopathy of posterior or anterior cervical chain or axillae bilaterally.  Gait antalgic MSK:  Non tender with full range of motion and good stability and symmetric strength and tone of shoulders, elbows, wrist, hip, and ankles bilaterally.  Knee: Left valgus deformity noted.  Abnormal thigh to calf ratio.  Tender to palpation over medial and PF joint line.  ROM full in flexion and extension and lower leg rotation. instability with valgus force.  painful patellar compression. Patellar glide with moderate crepitus. Patellar and quadriceps tendons unremarkable. Hamstring and quadriceps strength is normal. Contralateral knee has some mild arthritic changes as well.   Impression and  Recommendations:     The above documentation has been reviewed and is accurate and complete Lyndal Pulley, DO       Note: This dictation was prepared with Dragon dictation along with smaller phrase technology. Any transcriptional errors that result from this process are unintentional.

## 2019-09-10 ENCOUNTER — Encounter: Payer: Self-pay | Admitting: Family Medicine

## 2019-09-10 ENCOUNTER — Ambulatory Visit: Payer: Medicare HMO | Admitting: Family Medicine

## 2019-09-10 ENCOUNTER — Other Ambulatory Visit: Payer: Self-pay

## 2019-09-10 ENCOUNTER — Other Ambulatory Visit: Payer: Self-pay | Admitting: Internal Medicine

## 2019-09-10 DIAGNOSIS — M1712 Unilateral primary osteoarthritis, left knee: Secondary | ICD-10-CM

## 2019-09-10 NOTE — Patient Instructions (Signed)
Doing great Tart cherry 1200mg  at night See me in 2-3 months

## 2019-09-10 NOTE — Assessment & Plan Note (Signed)
Patient is doing well overall.  Has not gotten all the lateral vitamins.  No significant increase in instability.  Discussed with patient that icing regimen and home exercises, discussed avoiding certain activities.  Follow-up again in 4 to 8 weeks.

## 2019-09-26 ENCOUNTER — Ambulatory Visit: Payer: Medicare HMO | Admitting: Cardiology

## 2019-10-10 ENCOUNTER — Ambulatory Visit (INDEPENDENT_AMBULATORY_CARE_PROVIDER_SITE_OTHER): Payer: Medicare HMO

## 2019-10-10 ENCOUNTER — Other Ambulatory Visit: Payer: Self-pay

## 2019-10-10 DIAGNOSIS — Z23 Encounter for immunization: Secondary | ICD-10-CM | POA: Diagnosis not present

## 2019-10-14 NOTE — Progress Notes (Signed)
Cardiology Office Note:    Date:  10/15/2019   ID:  Sarah Santos, DOB [birth year], MRN [id number]  PCP:  Sarah Koch, MD  Cardiologist:  Sarah More, MD    Referring MD: Sarah Santos, *    ASSESSMENT:    1. Dyspnea on exertion   2. Essential hypertension   3. Mixed hyperlipidemia    PLAN:    In order of problems listed above:  1. Improved echocardiogram with no evidence of heart failure 2. Stable BP at target continue current treatment 3. Stable continue her statin   Next appointment: As needed   Medication Adjustments/Labs and Tests Ordered: Current medicines are reviewed at length with the patient today.  Concerns regarding medicines are outlined above.  No orders of the defined types were placed in this encounter.  No orders of the defined types were placed in this encounter.   Chief Complaint  Patient presents with  . Follow-up  . Shortness of Breath    History of Present Illness:    Sarah Santos is a 68 y.o. female with a hx of hypertension obstructive sleep apnea and shortness of breath last seen 08/06/2019.  Echocardiogram was performed 08/13/2019 normal left ventricular size and function EF greater than 65% Global longitudinal strain was normal the atrium is mildly dilated and otherwise normal.  She is  mildly anemic hemoglobin 10.9.  Chest CT 07/26/2019 showed thoracic aortic atherosclerosis the coronary arteries were now calcified there is mild abnormality with groundglass opacity in the left lung base felt to be due to atelectasis.  A previous myocardial perfusion study 2013 was normal. Compliance with diet, lifestyle and medications: Yes  She is doing better her shortness of breath is resolved and her edema is improved.  No orthopnea chest pain palpitation or syncope she is involved in exercise bicycle class.  Sometimes when she shifts posture immediately after exercise she is lightheaded.  We discussed the  potential further evaluation ischemia with either perfusion study or cardiac CTA but after shared decision making did not feel it is necessary at this time and I will see her back in the office as needed and she will call me if she is having cardiovascular symptoms Past Medical History:  Diagnosis Date  . ALLERGIC RHINITIS   . 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 GERD   . HEADACHE, CHRONIC   . Heart murmur   . Hematuria, microscopic   . History of anemia   . Hx of adenomatous colonic polyps   . Hypercalcemia   . HYPERLIPIDEMIA   . Hypertension   . OSA on CPAP   . Personal history of colonic polyps 01/04/2008  . Primary hyperparathyroidism [facility])     Past Surgical History:  Procedure Laterality Date  . carpal tunnel Bilateral   . COLONOSCOPY W/ POLYPECTOMY  2009, 08/18/11   2009:adenomas, largest 4 cm, rectal carcinoid, hemorrhoids  . OVARIAN CYST REMOVAL  70's  . TUBAL LIGATION      Current Medications: Current Meds  Medication Sig  . loratadine (CLARITIN) 10 MG tablet Take 10 mg by mouth daily as needed for allergies.  Marland Kitchen losartan (COZAAR) 50 MG tablet TAKE 1 TABLET EVERY DAY  . losartan-hydrochlorothiazide (HYZAAR) 50-12.5 MG tablet TAKE 1 TABLET EVERY DAY  . pantoprazole (PROTONIX) 40 MG tablet TAKE 1 TABLET (40 MG TOTAL) BY MOUTH DAILY.  . simvastatin (ZOCOR) 20 MG tablet TAKE 1 TABLET BY MOUTH DAILY AT 6 PM.  Allergies:   Patient has no known allergies.   Social History   Socioeconomic History  . Marital status: Divorced    Spouse name: Not on file  . Number of children: Not on file  . Years of education: Not on file  . Highest education level: Not on file  Occupational History  . Not on file  Social Needs  . Financial resource strain: Not on file  . Food insecurity    Worry: Not on file    Inability: Not on file  . Transportation needs    Medical: Not on file    Non-medical: Not on file  Tobacco Use  . Smoking status:  Former Smoker    Types: Cigarettes    Quit date: 1981    Years since quitting: 39.8  . Smokeless tobacco: Never Used  Substance and Sexual Activity  . Alcohol use: No  . Drug use: No  . Sexual activity: Not Currently    Birth control/protection: Post-menopausal, Surgical  Lifestyle  . Physical activity    Days per week: Not on file    Minutes per session: Not on file  . Stress: Not on file  Relationships  . Social Herbalist on phone: Not on file    Gets together: Not on file    Attends religious service: Not on file    Active member of club or organization: Not on file    Attends meetings of clubs or organizations: Not on file    Relationship status: Not on file  Other Topics Concern  . 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 There is no history of Colon cancer, Colon polyps, Other, Esophageal cancer, or Rectal cancer. ROS:   Please see the history of present illness.    All other systems reviewed and are negative.  EKGs/Labs/Other Studies Reviewed:    The following studies were reviewed today:   Recent Labs: 07/17/2019: ALT 16; BUN 20; Creatinine, Ser 1.10; Hemoglobin 10.9; Platelets 273.0; Potassium 3.3; Sodium 138  Recent Lipid Panel  01/31/2019 cholesterol 207 HDL 61 LDL 128    Component Value Date/Time   CHOL 207 (H) 01/31/2019 1100   TRIG 85.0 01/31/2019 1100   HDL 61.90 01/31/2019 1100   CHOLHDL 3 01/31/2019 1100   VLDL 17.0 01/31/2019 1100   LDLCALC 128 (H) 01/31/2019 1100   LDLDIRECT 156.9 02/01/2012 1442    Physical Exam:    VS:  BP 120/62 (BP Location:  Right Arm, Patient Position: Sitting, Cuff Size: Large)   Pulse (!) 102   Ht 5\' 1"  (1.549 m)   Wt 205 lb 1.9 oz (93 kg)   SpO2 99%   BMI 38.76 kg/m     Wt Readings from Last 3 Encounters:  10/15/19 205 lb 1.9 oz (93 kg)  09/10/19 208 lb (94.3 kg)  08/06/19 210 lb 12.8 oz (95.6 kg)     GEN:  Well nourished, well developed in no acute distress HEENT: Normal NECK: No JVD; No carotid bruits LYMPHATICS: No lymphadenopathy CARDIAC: RRR, no murmurs, rubs, gallops RESPIRATORY:  Clear to auscultation without rales, wheezing or rhonchi  ABDOMEN: Soft, non-tender, non-distended MUSCULOSKELETAL:  No edema; No deformity  SKIN: Warm and dry NEUROLOGIC:  Alert and oriented x 3 PSYCHIATRIC:  Normal affect    Signed, Sarah More, MD  10/15/2019 1:47 PM    [facility]

## 2019-10-15 ENCOUNTER — Other Ambulatory Visit: Payer: Self-pay

## 2019-10-15 ENCOUNTER — Ambulatory Visit (INDEPENDENT_AMBULATORY_CARE_PROVIDER_SITE_OTHER): Payer: Medicare HMO | Admitting: Cardiology

## 2019-10-15 VITALS — BP 120/62 | HR 102 | Ht 61.0 in | Wt 205.1 lb

## 2019-10-15 DIAGNOSIS — E782 Mixed hyperlipidemia: Secondary | ICD-10-CM | POA: Diagnosis not present

## 2019-10-15 DIAGNOSIS — R0600 Dyspnea, unspecified: Secondary | ICD-10-CM | POA: Diagnosis not present

## 2019-10-15 DIAGNOSIS — I10 Essential (primary) hypertension: Secondary | ICD-10-CM

## 2019-10-15 DIAGNOSIS — R0609 Other forms of dyspnea: Secondary | ICD-10-CM

## 2019-10-15 NOTE — Patient Instructions (Signed)
Medication Instructions:  Your physician recommends that you continue on your current medications as directed. Please refer to the Current Medication list given to you today.  *If you need a refill on your cardiac medications before your next appointment, please call your pharmacy*  Lab Work: None  If you have labs (blood work) drawn today and your tests are completely normal, you will receive your results only by: Marland Kitchen MyChart Message (if you have MyChart) OR . A paper copy in the mail If you have any lab test that is abnormal or we need to change your treatment, we will call you to review the results.  Testing/Procedures: None  Follow-Up: At [facility], you and your health needs are our priority.  As part of our continuing mission to provide you with exceptional heart care, we have created designated Provider Care Teams.  These Care Teams include your primary Cardiologist (physician) and Advanced Practice Providers (APPs -  Physician Assistants and Nurse Practitioners) who all work together to provide you with the care you need, when you need it.  Your next appointment:   as needed if symptoms worsen or fail to improve  The format for your next appointment:   In Person  Provider:   Shirlee More, MD

## 2019-11-09 DIAGNOSIS — H5203 Hypermetropia, bilateral: Secondary | ICD-10-CM | POA: Diagnosis not present

## 2019-11-15 ENCOUNTER — Other Ambulatory Visit: Payer: Self-pay | Admitting: Internal Medicine

## 2019-11-18 ENCOUNTER — Other Ambulatory Visit: Payer: Self-pay | Admitting: Internal Medicine

## 2019-11-26 ENCOUNTER — Ambulatory Visit: Payer: Medicare HMO | Admitting: Family Medicine

## 2019-12-04 DIAGNOSIS — G4733 Obstructive sleep apnea (adult) (pediatric): Secondary | ICD-10-CM | POA: Diagnosis not present

## 2020-01-07 ENCOUNTER — Encounter: Payer: Self-pay | Admitting: Family

## 2020-01-07 ENCOUNTER — Other Ambulatory Visit: Payer: Self-pay

## 2020-01-07 ENCOUNTER — Ambulatory Visit (INDEPENDENT_AMBULATORY_CARE_PROVIDER_SITE_OTHER): Payer: Medicare HMO | Admitting: Family

## 2020-01-07 VITALS — BP 130/78 | HR 98 | Temp 98.1°F | Ht 61.0 in | Wt 202.0 lb

## 2020-01-07 DIAGNOSIS — R9431 Abnormal electrocardiogram [ECG] [EKG]: Secondary | ICD-10-CM | POA: Diagnosis not present

## 2020-01-07 DIAGNOSIS — I10 Essential (primary) hypertension: Secondary | ICD-10-CM

## 2020-01-07 DIAGNOSIS — R42 Dizziness and giddiness: Secondary | ICD-10-CM | POA: Diagnosis not present

## 2020-01-07 LAB — COMPREHENSIVE METABOLIC PANEL
ALT: 17 U/L (ref 0–35)
AST: 18 U/L (ref 0–37)
Albumin: 4.6 g/dL (ref 3.5–5.2)
Alkaline Phosphatase: 95 U/L (ref 39–117)
BUN: 22 mg/dL (ref 6–23)
CO2: 30 mEq/L (ref 19–32)
Calcium: 11.2 mg/dL — ABNORMAL HIGH (ref 8.4–10.5)
Chloride: 98 mEq/L (ref 96–112)
Creatinine, Ser: 1.03 mg/dL (ref 0.40–1.20)
GFR: 64.49 mL/min (ref >60.00)
Glucose, Bld: 100 mg/dL — ABNORMAL HIGH (ref 70–99)
Potassium: 3.1 mEq/L — ABNORMAL LOW (ref 3.5–5.1)
Sodium: 136 mEq/L (ref 135–145)
Total Bilirubin: 0.4 mg/dL (ref 0.2–1.2)
Total Protein: 8.5 g/dL — ABNORMAL HIGH (ref 6.0–8.3)

## 2020-01-07 LAB — CBC WITH DIFFERENTIAL/PLATELET
Basophils Absolute: 0 10*3/uL (ref 0.0–0.1)
Basophils Relative: 0.6 % (ref 0.0–3.0)
Eosinophils Absolute: 0.1 10*3/uL (ref 0.0–0.7)
Eosinophils Relative: 1.7 % (ref 0.0–5.0)
HCT: 34.5 % — ABNORMAL LOW (ref 36.0–46.0)
Hemoglobin: 11.1 g/dL — ABNORMAL LOW (ref 12.0–15.0)
Lymphocytes Relative: 41.8 % (ref 12.0–46.0)
Lymphs Abs: 3.6 10*3/uL (ref 0.7–4.0)
MCHC: 32.1 g/dL (ref 30.0–36.0)
MCV: 79.9 fl (ref 78.0–100.0)
Monocytes Absolute: 0.7 10*3/uL (ref 0.1–1.0)
Monocytes Relative: 8.2 % (ref 3.0–12.0)
Neutro Abs: 4.1 10*3/uL (ref 1.4–7.7)
Neutrophils Relative %: 47.7 % (ref 43.0–77.0)
Platelets: 296 10*3/uL (ref 150.0–400.0)
RBC: 4.31 Mil/uL (ref 3.87–5.11)
RDW: 14.4 % (ref 11.5–15.5)
WBC: 8.5 10*3/uL (ref 4.0–10.5)

## 2020-01-07 MED ORDER — AMLODIPINE BESYLATE 5 MG PO TABS: 5.0000 mg | ORAL_TABLET | Freq: Every day | ORAL | 0 refills | Status: DC

## 2020-01-07 NOTE — Progress Notes (Signed)
Sarah Santos is a 69 y.o. female with the following history as recorded in EpicCare:  Patient Active Problem List   Diagnosis Date Noted  . Hyperlipidemia   . Hypercalcemia   . Degenerative arthritis of left knee 08/02/2019  . Dyspnea on exertion 07/17/2019  . Atypical chest pain 07/17/2019  . Chronic pain of left knee 07/17/2019  . Menopausal symptom 05/26/2018  . OSA on CPAP 05/26/2017  . Routine general medical examination at a health care facility 06/13/2015  . 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 Anemia   . Hypertension 04/04/2012  . Primary hyperparathyroidism ([place]) 01/25/2011  . GERD 01/19/2011  . MICROSCOPIC HEMATURIA 07/02/2009  . Dyslipidemia 05/08/2009  . ALLERGIC RHINITIS 05/08/2009  . Personal history of colonic polyps 01/04/2008    Current Outpatient Medications  Medication Sig Dispense Refill  . loratadine (CLARITIN) 10 MG tablet Take 10 mg by mouth daily as needed for allergies.    Marland Kitchen losartan (COZAAR) 50 MG tablet TAKE 1 TABLET EVERY DAY 90 tablet 0  . losartan-hydrochlorothiazide (HYZAAR) 50-12.5 MG tablet TAKE 1 TABLET EVERY DAY 90 tablet 0  . pantoprazole (PROTONIX) 40 MG tablet TAKE 1 TABLET (40 MG TOTAL) BY MOUTH DAILY. 90 tablet 1  . simvastatin (ZOCOR) 20 MG tablet TAKE 1 TABLET BY MOUTH DAILY AT 6 PM. 90 tablet 1   No current facility-administered medications for this visit.    Allergies: Patient has no known allergies.  Past Medical History:  Diagnosis Date  . ALLERGIC RHINITIS   . 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 GERD   . HEADACHE, CHRONIC   . Heart murmur   . Hematuria, microscopic   . History of anemia   . Hx of adenomatous colonic polyps   . Hypercalcemia   . HYPERLIPIDEMIA   . Hypertension   . OSA on CPAP   . Personal history of colonic polyps 01/04/2008  . Primary hyperparathyroidism [facility])     Past Surgical History:  Procedure Laterality Date  .  carpal tunnel Bilateral   . COLONOSCOPY W/ POLYPECTOMY  2009, 08/18/11   2009:adenomas, largest 4 cm, rectal carcinoid, hemorrhoids  . OVARIAN CYST REMOVAL  70's  . TUBAL LIGATION      Family History  Problem Relation Age of Onset  . Heart disease Mother 53       PCI  . Pulmonary embolism Mother   . Cancer Other        Lung  . Heart disease Other        Coronary Atery Disease  . Stomach cancer Paternal Aunt   . Stroke Father   . Diabetes Brother   . Hyperlipidemia Brother   . Heart disease Brother   . Colon cancer Neg Hx   . Colon polyps Neg Hx   . Other Neg Hx        hyperparathyroidism  . Esophageal cancer Neg Hx   . Rectal cancer Neg Hx     Social History   Tobacco Use  . Smoking status: Former Smoker    Types: Cigarettes    Quit date: 1981    Years since quitting: 40.0  . Smokeless tobacco: Never Used  Substance Use Topics  . Alcohol use: No    Subjective:   Patient notes her blood pressure has been elevated for the past 5 days; first noticed last Friday- states she suddenly felt light- headed and decided to "check her blood  pressure"; notes that her systolic numbers were averaging anywhere from 145-175; she typically takes Losartan HCT 50/12.mg in the am and a 50 mg tablet around lunchtime; she opted to go ahead and start taking her Losartan HCT 50/12.5 mg bid for the past few days; this has offered good improvement for her numbers but her endocrinologist does not want her on HCTZ 25 mg ( max dose is 12.5 mg) due to history of hypercalcemia; patient was concerned that her heart rate has been elevated as well- notes her pulse was up to 102 last week; in reviewing notes, this is not abnormal for her- it was at 102 when she saw her cardiologist in October; Of note, patient has gained approximately 20+ pounds in the past 2 years- feels like this is source of her blood pressure being up.  Has seen cardiology in the past few months due to dyspnea on exertion- echo was done but  opted against stress test unless further symptoms developed.   Objective:  Vitals:   01/07/20 1206  BP: 130/78  Pulse: 98  Temp: 98.1 F (36.7 C)  TempSrc: Oral  SpO2: 98%  Weight: 202 lb (91.6 kg)  Height: 5' 1"  (1.549 m)    General: Well developed, well nourished, in no acute distress  Skin : Warm and dry.  Head: Normocephalic and atraumatic  Lungs: Respirations unlabored; clear to auscultation bilaterally without wheeze, rales, rhonchi  CVS exam: normal rate and regular rhythm.  Neurologic: Alert and oriented; speech intact; face symmetrical; moves all extremities well; CNII-XII intact without focal deficit   Assessment:  1. Essential hypertension   2. Light headedness   3. Abnormal EKG     Plan:  Update EKG today- does show some possible changes compared to previous EKG but patient has lotion on her skin and could not get leads to stick easily; ? If it is a valid reading; however, based on her description of feeling light- headed with movement and episodes of palpitations, will refer her back to her cardiologist; In the interim, try taking Losartan HCT 50/12.5 mg in the am and Amlodipine 5 mg in the pm; she will plan to do a blood pressure check/ CPE with her PCP in the next month.   This visit occurred during the SARS-CoV-2 public health emergency.  Safety protocols were in place, including screening questions prior to the visit, additional usage of staff PPE, and extensive cleaning of exam room while observing appropriate contact time as indicated for disinfecting solutions.    No follow-ups on file.  Orders Placed This Encounter  Procedures  . CBC w/Diff  . Comp Met (CMET)  . Ambulatory referral to Cardiology    Referral Priority:   Routine    Referral Type:   Consultation    Referral Reason:   Specialty Services Required    Referred to Provider:   Richardo Priest, MD    Requested Specialty:   Cardiology    Number of Visits Requested:   1    Requested  Prescriptions    No prescriptions requested or ordered in this encounter

## 2020-01-08 ENCOUNTER — Other Ambulatory Visit: Payer: Self-pay | Admitting: Family

## 2020-01-08 MED ORDER — POTASSIUM CHLORIDE ER 10 MEQ PO TBCR: 10.0000 meq | EXTENDED_RELEASE_TABLET | Freq: Every day | ORAL | 0 refills | Status: DC

## 2020-01-09 ENCOUNTER — Other Ambulatory Visit: Payer: Self-pay

## 2020-01-09 ENCOUNTER — Telehealth: Payer: Self-pay | Admitting: Family

## 2020-01-09 MED ORDER — POTASSIUM CHLORIDE ER 10 MEQ PO TBCR: 10.0000 meq | EXTENDED_RELEASE_TABLET | Freq: Every day | ORAL | 0 refills | Status: DC

## 2020-01-09 NOTE — Telephone Encounter (Addendum)
    Patient calling to request Potassium be sent to local pharmacy- [facility]  Patient also calling to report she is taking: Vitamin D 3 Glucosamine 400mg  total daily

## 2020-01-09 NOTE — Telephone Encounter (Signed)
Script sent to pharmacy as requested today.

## 2020-01-30 ENCOUNTER — Other Ambulatory Visit: Payer: Self-pay

## 2020-01-30 ENCOUNTER — Ambulatory Visit (INDEPENDENT_AMBULATORY_CARE_PROVIDER_SITE_OTHER): Payer: Medicare HMO | Admitting: Cardiology

## 2020-01-30 ENCOUNTER — Encounter: Payer: Self-pay | Admitting: Cardiology

## 2020-01-30 VITALS — BP 130/74 | HR 121 | Ht 61.0 in | Wt 204.0 lb

## 2020-01-30 DIAGNOSIS — R0600 Dyspnea, unspecified: Secondary | ICD-10-CM | POA: Diagnosis not present

## 2020-01-30 DIAGNOSIS — I10 Essential (primary) hypertension: Secondary | ICD-10-CM | POA: Diagnosis not present

## 2020-01-30 DIAGNOSIS — R0609 Other forms of dyspnea: Secondary | ICD-10-CM

## 2020-01-30 DIAGNOSIS — E782 Mixed hyperlipidemia: Secondary | ICD-10-CM

## 2020-01-30 DIAGNOSIS — I2584 Coronary atherosclerosis due to calcified coronary lesion: Secondary | ICD-10-CM

## 2020-01-30 DIAGNOSIS — I2510 Atherosclerotic heart disease of native coronary artery without angina pectoris: Secondary | ICD-10-CM

## 2020-01-30 NOTE — Progress Notes (Addendum)
Cardiology Office Note:    Date:  01/30/2020   ID:  Sarah Santos, DOB 1951/08/06, MRN [id number]  PCP:  Hoyt Koch, MD  Cardiologist:  Shirlee More, MD    Referring MD: Marrian Salvage,*    ASSESSMENT:    1. Dyspnea on exertion   2. Essential hypertension   3. Mixed hyperlipidemia    PLAN:    In order of problems listed above:  1. Improved 2. Controlled continue ARB thiazide diuretic 3. Continue statin lipids follow-up with her PCP 4. With coronary artery calcification referral for myocardial perfusion study   Next appointment: 6 months    Medication Adjustments/Labs and Tests Ordered: Current medicines are reviewed at length with the patient today.  Concerns regarding medicines are outlined above.  No orders of the defined types were placed in this encounter.  No orders of the defined types were placed in this encounter.   Chief Complaint  Patient presents with  . Follow-up  . Hypertension  . Hyperlipidemia    History of Present Illness:    Sarah Santos is a 69 y.o. female with a hx of  hypertension obstructive sleep apnea and shortness of breath seen 08/06/2019.  Echocardiogram was performed 08/13/2019 normal left ventricular size and function EF greater than 65% Global longitudinal strain was normal the atrium is mildly dilated and otherwise normal.  She is  mildly anemic hemoglobin 10.9.  Chest CT 07/26/2019 showed thoracic aortic and atherosclerosis the coronary arteries were now calcified there is mild abnormality with groundglass opacity in the left lung base felt to be due to atelectasis.  A previous myocardial perfusion study 2013 was normal.  She was last seen 10/15/2019.  At that time shortness of breath was improved and peripheral edema had resolved.  After discussion she declined an ischemia evaluation at that time. Compliance with diet, lifestyle and medications: Yes  Her calcium is elevated she has a history of  what she tells me has hyperparathyroidism and I told her for now to stop calcium and vitamin D supplement blood pressure is at target is not having edema or shortness of breath.  She is concerned about coronary artery calcification after discussion she will undergo myocardial perfusion study to screen for obstructive CAD.  No chest pain. Past Medical History:  Diagnosis Date  . ALLERGIC RHINITIS   . 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 GERD   . HEADACHE, CHRONIC   . Heart murmur   . Hematuria, microscopic   . History of anemia   . Hx of adenomatous colonic polyps   . Hypercalcemia   . HYPERLIPIDEMIA   . Hypertension   . OSA on CPAP   . Personal history of colonic polyps 01/04/2008  . Primary hyperparathyroidism [facility])     Past Surgical History:  Procedure Laterality Date  . carpal tunnel Bilateral   . COLONOSCOPY W/ POLYPECTOMY  2009, 08/18/11   2009:adenomas, largest 4 cm, rectal carcinoid, hemorrhoids  . OVARIAN CYST REMOVAL  70's  . TUBAL LIGATION      Current Medications: Current Meds  Medication Sig  . acetaminophen (TYLENOL) 500 MG tablet Take 500 mg by mouth every 6 (six) hours as needed.  . Cholecalciferol (VITAMIN D3 PO) Take by mouth.  . Glucosamine HCl (GLUCOSAMINE PO) Take by mouth.  . loratadine (CLARITIN) 10 MG tablet Take 10 mg by mouth daily as needed for allergies.  Marland Kitchen losartan (COZAAR) 50 MG tablet TAKE 1 TABLET EVERY DAY  .  losartan-hydrochlorothiazide (HYZAAR) 50-12.5 MG tablet TAKE 1 TABLET EVERY DAY  . pantoprazole (PROTONIX) 40 MG tablet TAKE 1 TABLET (40 MG TOTAL) BY MOUTH DAILY.  . simvastatin (ZOCOR) 20 MG tablet TAKE 1 TABLET BY MOUTH DAILY AT 6 PM.  . Turmeric (QC TUMERIC COMPLEX PO) Take by mouth.     Allergies:   Patient has no known allergies.   Social History   Socioeconomic History  . Marital status: Divorced    Spouse name: Not on file  . Number of children: Not on file  . Years of education: Not on file    . Highest education level: Not on file  Occupational History  . Not on file  Tobacco Use  . Smoking status: Former Smoker    Types: Cigarettes    Quit date: 1981    Years since quitting: 40.1  . Smokeless tobacco: Never Used  Substance and Sexual Activity  . Alcohol use: No  . Drug use: No  . Sexual activity: Not Currently    Birth control/protection: Post-menopausal, Surgical  Other Topics Concern  . 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 Difficulty of Paying Living Expenses: Not on file  Food Insecurity:   . Worried About Charity fundraiser in the Last Year: Not on file  . Ran Out of Food in the Last Year: Not on file  Transportation Needs:   . Lack of Transportation (Medical): Not on file  . Lack of Transportation (Non-Medical): Not on file  Physical Activity:   . Days of Exercise per Week: Not on file  . Minutes of Exercise per Session: Not on file  Stress:   . Feeling of Stress : Not on file  Social Connections:   . Frequency of Communication with Friends and Family: Not on file  . Frequency of Social Gatherings with Friends and Family: Not on file  . Attends Religious Services: Not on file  . Active Member of Clubs or Organizations: Not on file  . Attends Archivist Meetings: Not on file  . Marital Status: Not on file     Family History: The patient's family history includes Cancer in an other family member; Diabetes in her brother; Heart disease in her brother and another family member; Heart disease (age of onset: 63) in her mother; Hyperlipidemia in her brother; Pulmonary embolism in her mother; Stomach cancer in her paternal aunt; Stroke in her father. There is no history of Colon cancer, Colon polyps, Other, Esophageal cancer, or Rectal cancer. ROS:   Please see the history of present illness.    All other systems reviewed and are  negative.  EKGs/Labs/Other Studies Reviewed:    The following studies were reviewed today:  EKG 01/07/2020 showed sinus rhythm possible apical MI versus lead placement Recent Labs: 01/07/2020: ALT 17; BUN 22; Creatinine, Ser 1.03; Hemoglobin 11.1; Platelets 296.0; Potassium 3.1; Sodium 136  Recent Lipid Panel    Component Value Date/Time   CHOL 207 (H) 01/31/2019 1100   TRIG 85.0 01/31/2019 1100   HDL 61.90 01/31/2019 1100   CHOLHDL 3 01/31/2019 1100   VLDL 17.0 01/31/2019 1100   LDLCALC 128 (H) 01/31/2019 1100   LDLDIRECT 156.9 02/01/2012 1442    Physical Exam:    VS:  BP 130/74   Pulse (!) 121   Ht 5\' 1"  (1.549 m)   Wt 204 lb (92.5 kg)  SpO2 96%   BMI 38.55 kg/m     Wt Readings from Last 3 Encounters:  01/30/20 204 lb (92.5 kg)  01/07/20 202 lb (91.6 kg)  10/15/19 205 lb 1.9 oz (93 kg)     GEN:  Well nourished, well developed in no acute distress HEENT: Normal NECK: No JVD; No carotid bruits LYMPHATICS: No lymphadenopathy CARDIAC: RRR, no murmurs, rubs, gallops RESPIRATORY:  Clear to auscultation without rales, wheezing or rhonchi  ABDOMEN: Soft, non-tender, non-distended MUSCULOSKELETAL:  No edema; No deformity  SKIN: Warm and dry NEUROLOGIC:  Alert and oriented x 3 PSYCHIATRIC:  Normal affect    Signed, Shirlee More, MD  01/30/2020 2:41 PM    [facility]

## 2020-01-30 NOTE — Patient Instructions (Signed)
Medication Instructions:  Your physician recommends that you continue on your current medications as directed. Please refer to the Current Medication list given to you today.  *If you need a refill on your cardiac medications before your next appointment, please call your pharmacy*  Lab Work: NONE If you have labs (blood work) drawn today and your tests are completely normal, you will receive your results only by: Marland Kitchen MyChart Message (if you have MyChart) OR . A paper copy in the mail If you have any lab test that is abnormal or we need to change your treatment, we will call you to review the results.  Testing/Procedures: Your physician has requested that you have a lexiscan myoview. For further information please visit [email or web address]. Please follow instruction sheet, as given.    Follow-Up: At [facility], you and your health needs are our priority.  As part of our continuing mission to provide you with exceptional heart care, we have created designated Provider Care Teams.  These Care Teams include your primary Cardiologist (physician) and Advanced Practice Providers (APPs -  Physician Assistants and Nurse Practitioners) who all work together to provide you with the care you need, when you need it.  Your next appointment:   6 month(s)  The format for your next appointment:   In Person  Provider:   Shirlee More, MD  Other Instructions Regadenoson injection What is this medicine? REGADENOSON is used to test the heart for coronary artery disease. It is used in patients who can not exercise for their stress test. This medicine may be used for other purposes; ask your health care provider or pharmacist if you have questions. COMMON BRAND NAME(S): Lexiscan What should I tell my health care provider before I take this medicine? They need to know if you have any of these conditions:  heart problems  lung or breathing disease, like asthma or COPD  an unusual or allergic  reaction to regadenoson, other medicines, foods, dyes, or preservatives  pregnant or trying to get pregnant  breast-feeding How should I use this medicine? This medicine is for injection into a vein. It is given by a health care professional in a hospital or clinic setting. Talk to your pediatrician regarding the use of this medicine in children. Special care may be needed. Overdosage: If you think you have taken too much of this medicine contact a poison control center or emergency room at once. NOTE: This medicine is only for you. Do not share this medicine with others. What if I miss a dose? This does not apply. What may interact with this medicine?  caffeine  dipyridamole  guarana  theophylline This list may not describe all possible interactions. Give your health care provider a list of all the medicines, herbs, non-prescription drugs, or dietary supplements you use. Also tell them if you smoke, drink alcohol, or use illegal drugs. Some items may interact with your medicine. What should I watch for while using this medicine? Your condition will be monitored carefully while you are receiving this medicine. Do not take medicines, foods, or drinks with caffeine (like coffee, tea, or colas) for at least 12 hours before your test. If you do not know if something contains caffeine, ask your health care professional. What side effects may I notice from receiving this medicine? Side effects that you should report to your doctor or health care professional as soon as possible:  allergic reactions like skin rash, itching or hives, swelling of the face, lips, or tongue  breathing  problems  chest pain, tightness or palpitations  severe headache Side effects that usually do not require medical attention (report to your doctor or health care professional if they continue or are bothersome):  flushing  headache  irritation or pain at site where injected  nausea, vomiting This list  may not describe all possible side effects. Call your doctor for medical advice about side effects. You may report side effects to FDA at 1-800-FDA-1088. Where should I keep my medicine? This drug is given in a hospital or clinic and will not be stored at home. NOTE: This sheet is a summary. It may not cover all possible information. If you have questions about this medicine, talk to your doctor, pharmacist, or health care provider.  2020 Elsevier/Gold Standard (2008-08-11 15:08:13)  Cardiac Nuclear Scan A cardiac nuclear scan is a test that measures blood flow to the heart when a person is resting and when he or she is exercising. The test looks for problems such as:  Not enough blood reaching a portion of the heart.  The heart muscle not working normally. You may need this test if:  You have heart disease.  You have had abnormal lab results.  You have had heart surgery or a balloon procedure to open up blocked arteries (angioplasty).  You have chest pain.  You have shortness of breath. In this test, a radioactive dye (tracer) is injected into your bloodstream. After the tracer has traveled to your heart, an imaging device is used to measure how much of the tracer is absorbed by or distributed to various areas of your heart. This procedure is usually done at a hospital and takes 2-4 hours. Tell a health care provider about:  Any allergies you have.  All medicines you are taking, including vitamins, herbs, eye drops, creams, and over-the-counter medicines.  Any problems you or family members have had with anesthetic medicines.  Any blood disorders you have.  Any surgeries you have had.  Any medical conditions you have.  Whether you are pregnant or may be pregnant. What are the risks? Generally, this is a safe procedure. However, problems may occur, including:  Serious chest pain and heart attack. This is only a risk if the stress portion of the test is done.  Rapid  heartbeat.  Sensation of warmth in your chest. This usually passes quickly.  Allergic reaction to the tracer. What happens before the procedure?  Ask your health care provider about changing or stopping your regular medicines. This is especially important if you are taking diabetes medicines or blood thinners.  Follow instructions from your health care provider about eating or drinking restrictions.  Remove your jewelry on the day of the procedure. What happens during the procedure?  An IV will be inserted into one of your veins.  Your health care provider will inject a small amount of radioactive tracer through the IV.  You will wait for 20-40 minutes while the tracer travels through your bloodstream.  Your heart activity will be monitored with an electrocardiogram (ECG).  You will lie down on an exam table.  Images of your heart will be taken for about 15-20 minutes.  You may also have a stress test. For this test, one of the following may be done: ? You will exercise on a treadmill or stationary bike. While you exercise, your heart's activity will be monitored with an ECG, and your blood pressure will be checked. ? You will be given medicines that will increase blood flow to  parts of your heart. This is done if you are unable to exercise.  When blood flow to your heart has peaked, a tracer will again be injected through the IV.  After 20-40 minutes, you will get back on the exam table and have more images taken of your heart.  Depending on the type of tracer used, scans may need to be repeated 3-4 hours later.  Your IV line will be removed when the procedure is over. The procedure may vary among health care providers and hospitals. What happens after the procedure?  Unless your health care provider tells you otherwise, you may return to your normal schedule, including diet, activities, and medicines.  Unless your health care provider tells you otherwise, you may increase  your fluid intake. This will help to flush the contrast dye from your body. Drink enough fluid to keep your urine pale yellow.  Ask your health care provider, or the department that is doing the test: ? When will my results be ready? ? How will I get my results? Summary  A cardiac nuclear scan measures the blood flow to the heart when a person is resting and when he or she is exercising.  Tell your health care provider if you are pregnant.  Before the procedure, ask your health care provider about changing or stopping your regular medicines. This is especially important if you are taking diabetes medicines or blood thinners.  After the procedure, unless your health care provider tells you otherwise, increase your fluid intake. This will help flush the contrast dye from your body.  After the procedure, unless your health care provider tells you otherwise, you may return to your normal schedule, including diet, activities, and medicines. This information is not intended to replace advice given to you by your health care provider. Make sure you discuss any questions you have with your health care provider. Document Revised: 05/28/2018 Document Reviewed: 05/28/2018 Elsevier Patient Education  [place].

## 2020-01-30 NOTE — Addendum Note (Signed)
Addended by: Beckey Rutter on: 01/30/2020 02:47 PM   Modules accepted: Orders

## 2020-02-04 ENCOUNTER — Encounter: Payer: Medicare HMO | Admitting: Internal Medicine

## 2020-02-06 ENCOUNTER — Encounter: Payer: Medicare HMO | Admitting: Internal Medicine

## 2020-02-10 ENCOUNTER — Telehealth (HOSPITAL_COMMUNITY): Payer: Self-pay

## 2020-02-10 NOTE — Telephone Encounter (Signed)
Instructions left on the patient's answering machine. Asked to call back with any questions. S.Iyanni Hepp EMTP 

## 2020-02-13 ENCOUNTER — Encounter (HOSPITAL_COMMUNITY): Payer: Medicare HMO

## 2020-02-13 ENCOUNTER — Other Ambulatory Visit: Payer: Self-pay | Admitting: Internal Medicine

## 2020-02-16 ENCOUNTER — Ambulatory Visit: Payer: Medicare HMO | Attending: Internal Medicine

## 2020-02-16 DIAGNOSIS — Z23 Encounter for immunization: Secondary | ICD-10-CM

## 2020-02-16 NOTE — Progress Notes (Signed)
   Covid-19 Vaccination Clinic  Name:  Sarah Santos    MRN: [id number] DOB: 25-Jun-1951  02/16/2020  Sarah Santos was observed post Covid-19 immunization for 15 minutes without incidence. She was provided with Vaccine Information Sheet and instruction to access the V-Safe system.   Sarah Santos was instructed to call 911 with any severe reactions post vaccine: Marland Kitchen Difficulty breathing  . Swelling of your face and throat  . A fast heartbeat  . A bad rash all over your body  . Dizziness and weakness    Immunizations Administered    Name Date Dose VIS Date Route   Pfizer COVID-19 Vaccine 02/16/2020 11:09 AM 0.3 mL 12/06/2019 Intramuscular   Manufacturer: [place]   Lot: [id number]   [place]: [id number]

## 2020-02-18 ENCOUNTER — Other Ambulatory Visit: Payer: Self-pay

## 2020-02-18 ENCOUNTER — Ambulatory Visit (INDEPENDENT_AMBULATORY_CARE_PROVIDER_SITE_OTHER): Payer: Medicare HMO | Admitting: Internal Medicine

## 2020-02-18 ENCOUNTER — Encounter: Payer: Self-pay | Admitting: Internal Medicine

## 2020-02-18 VITALS — BP 132/78 | HR 93 | Temp 98.5°F | Ht 61.0 in | Wt 205.8 lb

## 2020-02-18 DIAGNOSIS — G4733 Obstructive sleep apnea (adult) (pediatric): Secondary | ICD-10-CM | POA: Diagnosis not present

## 2020-02-18 DIAGNOSIS — R0789 Other chest pain: Secondary | ICD-10-CM | POA: Diagnosis not present

## 2020-02-18 DIAGNOSIS — E785 Hyperlipidemia, unspecified: Secondary | ICD-10-CM | POA: Diagnosis not present

## 2020-02-18 DIAGNOSIS — Z9989 Dependence on other enabling machines and devices: Secondary | ICD-10-CM | POA: Diagnosis not present

## 2020-02-18 DIAGNOSIS — Z0000 Encounter for general adult medical examination without abnormal findings: Secondary | ICD-10-CM

## 2020-02-18 DIAGNOSIS — I10 Essential (primary) hypertension: Secondary | ICD-10-CM | POA: Diagnosis not present

## 2020-02-18 DIAGNOSIS — D649 Anemia, unspecified: Secondary | ICD-10-CM

## 2020-02-18 DIAGNOSIS — E210 Primary hyperparathyroidism: Secondary | ICD-10-CM | POA: Diagnosis not present

## 2020-02-18 LAB — LIPID PANEL
Cholesterol: 212 mg/dL — ABNORMAL HIGH (ref 0–200)
HDL: 62.3 mg/dL (ref >39.00)
LDL Cholesterol: 133 mg/dL — ABNORMAL HIGH (ref 0–99)
NonHDL: 149.85
Total CHOL/HDL Ratio: 3
Triglycerides: 82 mg/dL (ref 0.0–149.0)
VLDL: 16.4 mg/dL (ref 0.0–40.0)

## 2020-02-18 LAB — CBC
HCT: 32.6 % — ABNORMAL LOW (ref 36.0–46.0)
Hemoglobin: 10.4 g/dL — ABNORMAL LOW (ref 12.0–15.0)
MCHC: 32 g/dL (ref 30.0–36.0)
MCV: 79.9 fl (ref 78.0–100.0)
Platelets: 277 10*3/uL (ref 150.0–400.0)
RBC: 4.08 Mil/uL (ref 3.87–5.11)
RDW: 14.5 % (ref 11.5–15.5)
WBC: 7.1 10*3/uL (ref 4.0–10.5)

## 2020-02-18 LAB — COMPREHENSIVE METABOLIC PANEL
ALT: 18 U/L (ref 0–35)
AST: 18 U/L (ref 0–37)
Albumin: 4.4 g/dL (ref 3.5–5.2)
Alkaline Phosphatase: 99 U/L (ref 39–117)
BUN: 17 mg/dL (ref 6–23)
CO2: 30 mEq/L (ref 19–32)
Calcium: 11 mg/dL — ABNORMAL HIGH (ref 8.4–10.5)
Chloride: 101 mEq/L (ref 96–112)
Creatinine, Ser: 1.02 mg/dL (ref 0.40–1.20)
GFR: 65.2 mL/min (ref >60.00)
Glucose, Bld: 93 mg/dL (ref 70–99)
Potassium: 3.5 mEq/L (ref 3.5–5.1)
Sodium: 136 mEq/L (ref 135–145)
Total Bilirubin: 0.4 mg/dL (ref 0.2–1.2)
Total Protein: 7.9 g/dL (ref 6.0–8.3)

## 2020-02-18 LAB — VITAMIN D 25 HYDROXY (VIT D DEFICIENCY, FRACTURES): VITD: 31.45 ng/mL (ref 30.00–100.00)

## 2020-02-18 NOTE — Patient Instructions (Signed)
Health Maintenance, Female Adopting a healthy lifestyle and getting preventive care are important in promoting health and wellness. Ask your health care provider about:  The right schedule for you to have regular tests and exams.  Things you can do on your own to prevent diseases and keep yourself healthy. What should I know about diet, weight, and exercise? Eat a healthy diet   Eat a diet that includes plenty of vegetables, fruits, low-fat dairy products, and lean protein.  Do not eat a lot of foods that are high in solid fats, added sugars, or sodium. Maintain a healthy weight Body mass index (BMI) is used to identify weight problems. It estimates body fat based on height and weight. Your health care provider can help determine your BMI and help you achieve or maintain a healthy weight. Get regular exercise Get regular exercise. This is one of the most important things you can do for your health. Most adults should:  Exercise for at least 150 minutes each week. The exercise should increase your heart rate and make you sweat (moderate-intensity exercise).  Do strengthening exercises at least twice a week. This is in addition to the moderate-intensity exercise.  Spend less time sitting. Even light physical activity can be beneficial. Watch cholesterol and blood lipids Have your blood tested for lipids and cholesterol at 68 years of age, then have this test every 5 years. Have your cholesterol levels checked more often if:  Your lipid or cholesterol levels are high.  You are older than 68 years of age.  You are at high risk for heart disease. What should I know about cancer screening? Depending on your health history and family history, you may need to have cancer screening at various ages. This may include screening for:  Breast cancer.  Cervical cancer.  Colorectal cancer.  Skin cancer.  Lung cancer. What should I know about heart disease, diabetes, and high blood  pressure? Blood pressure and heart disease  High blood pressure causes heart disease and increases the risk of stroke. This is more likely to develop in people who have high blood pressure readings, are of African descent, or are overweight.  Have your blood pressure checked: ? Every 3-5 years if you are 18-39 years of age. ? Every year if you are 40 years old or older. Diabetes Have regular diabetes screenings. This checks your fasting blood sugar level. Have the screening done:  Once every three years after age 40 if you are at a normal weight and have a low risk for diabetes.  More often and at a younger age if you are overweight or have a high risk for diabetes. What should I know about preventing infection? Hepatitis B If you have a higher risk for hepatitis B, you should be screened for this virus. Talk with your health care provider to find out if you are at risk for hepatitis B infection. Hepatitis C Testing is recommended for:  Everyone born from 1945 through 1965.  Anyone with known risk factors for hepatitis C. Sexually transmitted infections (STIs)  Get screened for STIs, including gonorrhea and chlamydia, if: ? You are sexually active and are younger than 68 years of age. ? You are older than 68 years of age and your health care provider tells you that you are at risk for this type of infection. ? Your sexual activity has changed since you were last screened, and you are at increased risk for chlamydia or gonorrhea. Ask your health care provider if   you are at risk.  Ask your health care provider about whether you are at high risk for HIV. Your health care provider may recommend a prescription medicine to help prevent HIV infection. If you choose to take medicine to prevent HIV, you should first get tested for HIV. You should then be tested every 3 months for as long as you are taking the medicine. Pregnancy  If you are about to stop having your period (premenopausal) and  you may become pregnant, seek counseling before you get pregnant.  Take 400 to 800 micrograms (mcg) of folic acid every day if you become pregnant.  Ask for birth control (contraception) if you want to prevent pregnancy. Osteoporosis and menopause Osteoporosis is a disease in which the bones lose minerals and strength with aging. This can result in bone fractures. If you are 65 years old or older, or if you are at risk for osteoporosis and fractures, ask your health care provider if you should:  Be screened for bone loss.  Take a calcium or vitamin D supplement to lower your risk of fractures.  Be given hormone replacement therapy (HRT) to treat symptoms of menopause. Follow these instructions at home: Lifestyle  Do not use any products that contain nicotine or tobacco, such as cigarettes, e-cigarettes, and chewing tobacco. If you need help quitting, ask your health care provider.  Do not use street drugs.  Do not share needles.  Ask your health care provider for help if you need support or information about quitting drugs. Alcohol use  Do not drink alcohol if: ? Your health care provider tells you not to drink. ? You are pregnant, may be pregnant, or are planning to become pregnant.  If you drink alcohol: ? Limit how much you use to 0-1 drink a day. ? Limit intake if you are breastfeeding.  Be aware of how much alcohol is in your drink. In the [place] one drink equals one 12 oz bottle of beer (355 mL), one 5 oz glass of wine (148 mL), or one 1 oz glass of hard liquor (44 mL). General instructions  Schedule regular health, dental, and eye exams.  Stay current with your vaccines.  Tell your health care provider if: ? You often feel depressed. ? You have ever been abused or do not feel safe at home. Summary  Adopting a healthy lifestyle and getting preventive care are important in promoting health and wellness.  Follow your health care provider's instructions about healthy  diet, exercising, and getting tested or screened for diseases.  Follow your health care provider's instructions on monitoring your cholesterol and blood pressure. This information is not intended to replace advice given to you by your health care provider. Make sure you discuss any questions you have with your health care provider. Document Revised: 12/05/2018 Document Reviewed: 12/05/2018 Elsevier Patient Education  2020 Elsevier Inc.  

## 2020-02-18 NOTE — Progress Notes (Signed)
Subjective:   Patient ID: Sarah Santos, female    DOB: Jan 09, 1951, 69 y.o.   MRN: [id number]  HPI Here for medicare wellness and physical, no new complaints. Please see A/P for status and treatment of chronic medical problems.   Diet: heart healthy Physical activity: sedentary Depression/mood screen: negative Hearing: intact to whispered voice Visual acuity: grossly normal, performs annual eye exam behind due to covid-19 ADLs: capable Fall risk: none Home safety: good Cognitive evaluation: intact to orientation, naming, recall and repetition EOL planning: adv directives discussed    Office Visit from 02/18/2020 in [place] at [facility] Total Score  0       I have personally reviewed and have noted 1. The patient's medical and social history - reviewed today no changes 2. Their use of alcohol, tobacco or illicit drugs 3. Their current medications and supplements 4. The patient's functional ability including ADL's, fall risks, home safety risks and hearing or visual impairment. 5. Diet and physical activities 6. Evidence for depression or mood disorders 7. Care team reviewed and updated  Patient Care Team: Hoyt Koch, MD as PCP - General (Internal Medicine) Richardo Priest, MD as PCP - Cardiology (Cardiology) Gatha Mayer, MD (Gastroenterology) Marylynn Pearson, MD (Obstetrics and Gynecology) Bjorn Loser, MD (Urology) Past Medical History:  Diagnosis Date  . ALLERGIC RHINITIS   . 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 GERD   . HEADACHE, CHRONIC   . Heart murmur   . Hematuria, microscopic   . History of anemia   . Hx of adenomatous colonic polyps   . Hypercalcemia   . HYPERLIPIDEMIA   . Hypertension   . OSA on CPAP   . Personal history of colonic polyps 01/04/2008  . Primary hyperparathyroidism [facility])    Past Surgical History:  Procedure Laterality Date  . carpal tunnel Bilateral     . COLONOSCOPY W/ POLYPECTOMY  2009, 08/18/11   2009:adenomas, largest 4 cm, rectal carcinoid, hemorrhoids  . OVARIAN CYST REMOVAL  70's  . TUBAL LIGATION     Family History  Problem Relation Age of Onset  . Heart disease Mother 55       PCI  . Pulmonary embolism Mother   . Cancer Other        Lung  . Heart disease Other        Coronary Atery Disease  . Stomach cancer Paternal Aunt   . Stroke Father   . Diabetes Brother   . Hyperlipidemia Brother   . Heart disease Brother   . Colon cancer Neg Hx   . Colon polyps Neg Hx   . Other Neg Hx        hyperparathyroidism  . Esophageal cancer Neg Hx   . Rectal cancer Neg Hx     Review of Systems  Constitutional: Negative.   HENT: Negative.   Eyes: Negative.   Respiratory: Negative for cough, chest tightness and shortness of breath.   Cardiovascular: Negative for chest pain, palpitations and leg swelling.  Gastrointestinal: Negative for abdominal distention, abdominal pain, constipation, diarrhea, nausea and vomiting.  Musculoskeletal: Negative.   Skin: Negative.   Neurological: Negative.   Psychiatric/Behavioral: Negative.     Objective:  Physical Exam Constitutional:      Appearance: She is well-developed.  HENT:     Head: Normocephalic and atraumatic.  Cardiovascular:     Rate and Rhythm: Normal rate and regular rhythm.  Pulmonary:  Effort: Pulmonary effort is normal. No respiratory distress.     Breath sounds: Normal breath sounds. No wheezing or rales.  Abdominal:     General: Bowel sounds are normal. There is no distension.     Palpations: Abdomen is soft.     Tenderness: There is no abdominal tenderness. There is no rebound.  Musculoskeletal:     Cervical back: Normal range of motion.  Skin:    General: Skin is warm and dry.  Neurological:     Mental Status: She is alert and oriented to person, place, and time.     Coordination: Coordination normal.     Vitals:   02/18/20 1357  BP: 132/78  Pulse: 93   Temp: 98.5 F (36.9 C)  TempSrc: Oral  SpO2: 98%  Weight: 205 lb 12.8 oz (93.4 kg)  Height: 5\' 1"  (1.549 m)   This visit occurred during the SARS-CoV-2 public health emergency.  Safety protocols were in place, including screening questions prior to the visit, additional usage of staff PPE, and extensive cleaning of exam room while observing appropriate contact time as indicated for disinfecting solutions.   Assessment & Plan:

## 2020-02-19 NOTE — Assessment & Plan Note (Signed)
Stable calcium levels, recent DEXA without signs of bone loss. Monitor for now. Likely hctz is elevating calcium levels as well.

## 2020-02-19 NOTE — Assessment & Plan Note (Signed)
Checking CBC and if normal will remove from problem list.

## 2020-02-19 NOTE — Assessment & Plan Note (Signed)
Getting stress test later this week with cardiology.

## 2020-02-19 NOTE — Assessment & Plan Note (Signed)
Still using nightly.

## 2020-02-19 NOTE — Assessment & Plan Note (Signed)
Taking losartan/hctz 100/12.5 mg daily. Checking CMP and adjust as needed.

## 2020-02-19 NOTE — Assessment & Plan Note (Signed)
Flu shot up to date. Pneumonia due declines due to recent covid-19 shot. Shingrix counseled. Tetanus due declines. Colonoscopy due 2023. Mammogram due 2022, pap smear aged out and dexa due 2022. Counseled about sun safety and mole surveillance. Counseled about the dangers of distracted driving. Given 10 year screening recommendations.

## 2020-02-19 NOTE — Assessment & Plan Note (Signed)
Taking simvastatin, checking lipid panel and adjust as needed.

## 2020-02-20 ENCOUNTER — Other Ambulatory Visit: Payer: Self-pay

## 2020-02-20 ENCOUNTER — Ambulatory Visit (HOSPITAL_COMMUNITY): Payer: Medicare HMO | Attending: Cardiovascular Disease

## 2020-02-20 VITALS — Ht 61.0 in | Wt 204.0 lb

## 2020-02-20 DIAGNOSIS — E782 Mixed hyperlipidemia: Secondary | ICD-10-CM | POA: Diagnosis not present

## 2020-02-20 DIAGNOSIS — R0789 Other chest pain: Secondary | ICD-10-CM | POA: Diagnosis not present

## 2020-02-20 DIAGNOSIS — R0609 Other forms of dyspnea: Secondary | ICD-10-CM

## 2020-02-20 DIAGNOSIS — R0600 Dyspnea, unspecified: Secondary | ICD-10-CM | POA: Diagnosis not present

## 2020-02-20 DIAGNOSIS — I2510 Atherosclerotic heart disease of native coronary artery without angina pectoris: Secondary | ICD-10-CM | POA: Diagnosis not present

## 2020-02-20 DIAGNOSIS — I10 Essential (primary) hypertension: Secondary | ICD-10-CM

## 2020-02-20 DIAGNOSIS — I2584 Coronary atherosclerosis due to calcified coronary lesion: Secondary | ICD-10-CM

## 2020-02-20 LAB — MYOCARDIAL PERFUSION IMAGING
LV dias vol: 61 mL (ref 46–106)
LV sys vol: 18 mL
Peak HR: 114 {beats}/min
Rest HR: 68 {beats}/min
SDS: 4
SRS: 0
SSS: 4
TID: 0.82

## 2020-02-20 LAB — HEMOGLOBIN A1C: Hgb A1c MFr Bld: 5.9 % (ref 4.6–6.5)

## 2020-02-20 MED ORDER — REGADENOSON 0.4 MG/5ML IV SOLN
0.4000 mg | Freq: Once | INTRAVENOUS | Status: AC
  Administered 2020-02-20: 0.4 mg via INTRAVENOUS

## 2020-02-20 MED ORDER — TECHNETIUM TC 99M TETROFOSMIN IV KIT
30.7000 | PACK | Freq: Once | INTRAVENOUS | Status: AC | PRN
  Administered 2020-02-20: 30.7 via INTRAVENOUS
  Filled 2020-02-20: qty 31

## 2020-02-20 MED ORDER — TECHNETIUM TC 99M TETROFOSMIN IV KIT
10.4000 | PACK | Freq: Once | INTRAVENOUS | Status: AC | PRN
  Administered 2020-02-20: 10.4 via INTRAVENOUS
  Filled 2020-02-20: qty 11

## 2020-03-17 ENCOUNTER — Ambulatory Visit: Payer: Medicare HMO | Attending: Internal Medicine

## 2020-03-17 DIAGNOSIS — Z23 Encounter for immunization: Secondary | ICD-10-CM

## 2020-03-17 NOTE — Progress Notes (Signed)
   Covid-19 Vaccination Clinic  Name:  Sarah Santos    MRN: [id number] DOB: 1951-03-24  03/17/2020  Sarah Santos was observed post Covid-19 immunization for 15 minutes without incident. She was provided with Vaccine Information Sheet and instruction to access the V-Safe system.   Sarah Santos was instructed to call 911 with any severe reactions post vaccine: Marland Kitchen Difficulty breathing  . Swelling of face and throat  . A fast heartbeat  . A bad rash all over body  . Dizziness and weakness   Immunizations Administered    Name Date Dose VIS Date Route   Pfizer COVID-19 Vaccine 03/17/2020 11:26 AM 0.3 mL 12/06/2019 Intramuscular   Manufacturer: [place]   Lot: [id number]   [place]: [id number]

## 2020-04-15 ENCOUNTER — Other Ambulatory Visit: Payer: Self-pay | Admitting: Internal Medicine

## 2020-04-24 DIAGNOSIS — G4733 Obstructive sleep apnea (adult) (pediatric): Secondary | ICD-10-CM | POA: Diagnosis not present

## 2020-04-25 ENCOUNTER — Other Ambulatory Visit: Payer: Self-pay | Admitting: Internal Medicine

## 2020-05-22 ENCOUNTER — Ambulatory Visit: Payer: Medicare HMO | Admitting: Internal Medicine

## 2020-05-27 ENCOUNTER — Other Ambulatory Visit: Payer: Self-pay

## 2020-05-27 ENCOUNTER — Ambulatory Visit: Payer: Medicare HMO | Admitting: Family Medicine

## 2020-05-27 ENCOUNTER — Ambulatory Visit (INDEPENDENT_AMBULATORY_CARE_PROVIDER_SITE_OTHER): Payer: Medicare HMO

## 2020-05-27 ENCOUNTER — Ambulatory Visit: Payer: Self-pay

## 2020-05-27 ENCOUNTER — Encounter: Payer: Self-pay | Admitting: Family Medicine

## 2020-05-27 VITALS — BP 110/68 | HR 91 | Ht 61.0 in | Wt 210.4 lb

## 2020-05-27 DIAGNOSIS — M25561 Pain in right knee: Secondary | ICD-10-CM

## 2020-05-27 DIAGNOSIS — M1711 Unilateral primary osteoarthritis, right knee: Secondary | ICD-10-CM | POA: Diagnosis not present

## 2020-05-27 NOTE — Progress Notes (Signed)
I, Sarah Santos, LAT, ATC, am serving as scribe for Dr. Lynne Leader.  Sarah Santos is a 69 y.o. female who presents to [place] at [facility] today for R knee pain and swelling.  She was last seen by Dr. Tamala Julian on 09/10/19 for L knee pain.  Since her last visit w/ Dr. Tamala Julian, pt reports R knee pain and swelling x 2 months w/ no known MOI.  Radiating pain: yes into thigh and lower leg R knee swelling: yes R knee mechanical symptoms:  yes Aggravating factors: walking; stairs; in/out of her car and in/out of bed Treatments tried: Voltaren gel; Aspercreme; vitamins; knee brace  Diagnostic imaging: Standing B knee XR- 08/02/19  Pertinent review of systems: No fevers or chills  Relevant historical information: Hypertension, hypothyroidism   Exam:  BP 110/68 (BP Location: Right Arm, Patient Position: Sitting, Cuff Size: Large)   Pulse 91   Ht 5\' 1"  (1.549 m)   Wt 210 lb 6.4 oz (95.4 kg)   SpO2 98%   BMI 39.75 kg/m  General: Well Developed, well nourished, and in no acute distress.   MSK: Right knee normal-appearing without significant swelling or effusion. Tender palpation medial joint line. Nontender otherwise. Normal motion with mild crepitation. Stable ligamentous exam. Intact strength. Mild positive medial McMurray's test.    Lab and Radiology Results X-ray images right knee obtained today personally and independently reviewed Medial compartment DJD moderate.  Mild patellofemoral DJD.  No acute fractures. Await formal radiology review  Diagnostic Limited MSK Ultrasound of: Right knee Quad tendon normal-appearing Mild effusion superior patellar space. Patellar tendon normal-appearing Lateral joint line largely normal-appearing Medial joint line narrowed with degenerative appearing medial meniscus. Posterior knee without Baker's cyst. Impression: Medial compartment DJD     Assessment and Plan: 69 y.o. female with knee pain ongoing for  about 2 months without injury.  Cause of pain is likely due to medial compartment DJD with degenerative medial meniscus tear.  Discussed treatment options.  Plan to maximize conservative management including Tylenol and Voltaren gel and compressive knee sleeve.  Discussed other options.  Plan for home exercise program and trial of physical therapy.  Next step should be likely steroid injection versus hyaluronic acid injection.  Additionally recommend weight loss in addition to quad strengthening as part of physical therapy as above. Recheck back in 6 weeks or so.  PDMP not reviewed this encounter. Orders Placed This Encounter  Procedures  . [place] LIMITED JOINT SPACE STRUCTURES LOW RIGHT(NO LINKED CHARGES)    Order Specific Question:   Reason for Exam (SYMPTOM  OR DIAGNOSIS REQUIRED)    Answer:   R knee pain    Order Specific Question:   Preferred imaging location?    Answer:   [name]  . DG Knee AP/LAT W/[place] Right    Standing Status:   Future    Number of Occurrences:   1    Standing Expiration Date:   05/27/2021    Order Specific Question:   Reason for Exam (SYMPTOM  OR DIAGNOSIS REQUIRED)    Answer:   eval knee pain    Order Specific Question:   Preferred imaging location?    Answer:   [name]    Order Specific Question:   Radiology Contrast Protocol - do NOT remove file path    Answer:   \\[email or web address].pdf  . Ambulatory referral to Physical Therapy    Referral Priority:   Routine    Referral Type:  Physical Medicine    Referral Reason:   Specialty Services Required    Requested Specialty:   Physical Therapy   No orders of the defined types were placed in this encounter.    Discussed warning signs or symptoms. Please see discharge instructions. Patient expresses understanding.   The above documentation has been reviewed and is accurate and complete Lynne Leader, M.D.

## 2020-05-27 NOTE — Patient Instructions (Addendum)
Thank you for coming in today. Ok to maximize the tylenol to 4000mg  total per day.  Ok to use voltaren gel 4x daily.  Next steps are PT or injection if needed.  Get xray today.  Let me know what you think.  Ok to do more in the future if needed.   Recheck in 6 weeks or sooner if needed.  Ok to follow up with myself or Dr Tamala Julian.

## 2020-05-28 NOTE — Progress Notes (Signed)
X-ray right knee shows some arthritis

## 2020-06-09 ENCOUNTER — Other Ambulatory Visit: Payer: Self-pay

## 2020-06-09 ENCOUNTER — Ambulatory Visit: Payer: Medicare HMO | Admitting: Physical Therapy

## 2020-06-09 ENCOUNTER — Encounter: Payer: Self-pay | Admitting: Physical Therapy

## 2020-06-09 DIAGNOSIS — M25561 Pain in right knee: Secondary | ICD-10-CM | POA: Diagnosis not present

## 2020-06-09 DIAGNOSIS — R2689 Other abnormalities of gait and mobility: Secondary | ICD-10-CM | POA: Diagnosis not present

## 2020-06-09 DIAGNOSIS — R2681 Unsteadiness on feet: Secondary | ICD-10-CM | POA: Diagnosis not present

## 2020-06-09 DIAGNOSIS — R531 Weakness: Secondary | ICD-10-CM

## 2020-06-09 DIAGNOSIS — M25551 Pain in right hip: Secondary | ICD-10-CM

## 2020-06-09 DIAGNOSIS — M25661 Stiffness of right knee, not elsewhere classified: Secondary | ICD-10-CM | POA: Diagnosis not present

## 2020-06-09 DIAGNOSIS — R600 Localized edema: Secondary | ICD-10-CM

## 2020-06-10 NOTE — Therapy (Signed)
[facility] Physical Therapy [address] [place], [place], [address] Phone: [phone]   Fax:  [phone]  Physical Therapy Evaluation  Patient Details  Name: Sarah Santos MRN: [id number] Date of Birth: [birth year] Referring Provider (PT): Lynne Leader, MD   Encounter Date: 06/09/2020   PT End of Session - 06/10/20 0711    Visit Number 1    Number of Visits 8    Authorization Type [employer]    PT Start Time 1430    PT Stop Time 1512    PT Time Calculation (min) 42 min    Equipment Utilized During Treatment Other (comment)   right knee OTC brace   Activity Tolerance Patient tolerated treatment well    Behavior During Therapy [facility] for tasks assessed/performed           Past Medical History:  Diagnosis Date  . ALLERGIC RHINITIS   . 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 GERD   . HEADACHE, CHRONIC   . Heart murmur   . Hematuria, microscopic   . History of anemia   . Hx of adenomatous colonic polyps   . Hypercalcemia   . HYPERLIPIDEMIA   . Hypertension   . OSA on CPAP   . Personal history of colonic polyps 01/04/2008  . Primary hyperparathyroidism [facility])     Past Surgical History:  Procedure Laterality Date  . carpal tunnel Bilateral   . COLONOSCOPY W/ POLYPECTOMY  2009, 08/18/11   2009:adenomas, largest 4 cm, rectal carcinoid, hemorrhoids  . OVARIAN CYST REMOVAL  70's  . TUBAL LIGATION      There were no vitals filed for this visit.    Subjective Assessment - 06/09/20 1432    Subjective This 69yo female was referred on 05/27/2020 by Lynne Leader, MD with acute right knee pain.  The pain began hurting ~3 months with no known cause. She had issue with left knee last year but got better with over the counter medications. She exercises 2x/wk at [facility] with spin class 1x & strength class 1x.    Pertinent History rectal carcinoid tumor, heart murmur, HTN, arthritis,    Patient Stated Goals To get rid of pain  in right leg to resume daily activities    Currently in Pain? Yes    Pain Score 6    In last week worst 7-8/10, best 1-2/10   Pain Location Knee    Pain Orientation Right;Anterior;Lateral    Pain Descriptors / Indicators Aching    Pain Type Chronic pain    Pain Radiating Towards radates up to hip laterally and down to foot especially plantar    Pain Onset 1 to 4 weeks ago    Pain Frequency Constant    Aggravating Factors  standing on it, leg extended with driving, initial standing    Pain Relieving Factors once start moving, changing positions    Effect of Pain on Daily Activities using cruise more to drive              [facility] PT Assessment - 06/09/20 1430      Assessment   Medical Diagnosis Right Knee pain    Referring Provider (PT) Lynne Leader, MD    Onset Date/Surgical Date 05/27/20   MD referral to PT   Hand Dominance Right    Prior Therapy none      Precautions   Precautions Fall      Balance Screen   Has the patient fallen in the past  6 months No    Has the patient had a decrease in activity level because of a fear of falling?  No    Is the patient reluctant to leave their home because of a fear of falling?  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    Leisure exercise at [employer]      Posture/Postural Control   Posture/Postural Control Postural limitations    Postural Limitations Rounded Shoulders;Forward head;Weight shift left      ROM /  Strength   AROM / PROM / Strength AROM;Strength      AROM   Overall AROM  Deficits    AROM Assessment Site Knee    Right Knee Extension -13   seated   Right Knee Flexion 104   seated   Left Knee Extension -12   seated   Left Knee Flexion 110   seated     PROM   Overall PROM  Deficits    Right Knee Extension -9   supine   Right Knee Flexion 108   supine   Left Knee Extension -2   supine   Left Knee Flexion 114   supine     Strength   Overall Strength Deficits    Strength Assessment Site Hip;Knee;Ankle    Right Hip Flexion 5/5    Right Hip Extension 3-/5    Right Hip ABduction 3+/5    Left Hip Flexion 5/5    Left Hip Extension 4/5    Left Hip ABduction 4/5    Right/Left Knee Right;Left    Right Knee Flexion 4/5    Right Knee Extension 4/5    Left Knee Flexion 5/5    Left Knee Extension 5/5    Right Ankle Dorsiflexion 5/5    Right Ankle Plantar Flexion 3-/5   standing w/light UE support for balance   Left Ankle Dorsiflexion 5/5    Left Ankle Plantar Flexion 4/5   standing w/light UE support for balance     Flexibility   ITB negative tenderness except greater trochanteric bursa      Special Tests    Special Tests Hip Special Tests;Knee Special Tests    Hip Special Tests  Ober's Test    Knee Special tests  Lateral Pull Sign;other;other2      Ober's Test   Findings Negative    Side Right    Comments tenderness at right GT bursa      Lateral Pull Sign    Findings Negative    Side Right      other    Findings Negative    Side  Right    Comments medial & lateral stress       other   findings Negative    Side Right    Comments Anterior & posterior draw test      Ambulation/Gait   Ambulation/Gait Yes    Ambulation/Gait Assistance  5: Supervision    Ambulation/Gait Assistance Details right knee OTC hinged neoprene self purchased    Ambulation Distance (Feet) 200 Feet    Assistive device None    Gait Pattern Step-through pattern;Decreased arm swing -  right;Decreased stance time - right;Decreased hip/knee flexion - right;Right flexed knee in stance;Left flexed knee in stance;Antalgic;Wide base of support    Ambulation Surface Level;Indoor    Gait velocity 1.67 ft/sec                      Objective measurements completed on examination: See above findings.               PT Education - 06/10/20 0709    Education Details see pt instructions - overview of well rounded fitness program & weight loss    Person(s) Educated Patient    Methods Explanation;Verbal cues    Comprehension Verbalized understanding               PT Long Term Goals - 06/09/20 1723      PT LONG TERM GOAL #1   Title Patient verbalizes & demonstrates understanding of ongoing HEP  / fitness plan including weight management.    Time 4    Period Weeks    Status New    Target Date 07/21/20      PT LONG TERM GOAL #2   Title Patient reports right knee pain >75% improvement.    Time 4    Period Weeks    Status New    Target Date 07/21/20      PT LONG TERM GOAL #3   Title Right knee PROM extension -2* & flexion 110*    Time 4    Period Weeks    Status New    Target Date 07/21/20      PT LONG TERM GOAL #4   Title Right knee strength 5/5 flexion & extension    Time 4    Period Weeks    Status New    Target Date 07/21/20                  Plan - 06/10/20 0714    Clinical Impression Statement This 69yo female had onset of right knee pain ~3 months ago which limits her mobility & function. She has pain in right greater trochanteric bursa & calf/foot due to abnormal movement patterns. She has decreased right knee PROM & AROM with painful end feels.  She has weakness in hip, knee & ankle muscles. Patient would benefit from skilled PT to improve pain & function.    Personal Factors and Comorbidities Fitness    Examination-Activity Limitations Locomotion Level;Stairs;Squat;Stand;Transfers    Examination-Participation Restrictions  Community Activity    Stability/Clinical Decision Making Stable/Uncomplicated    Clinical Decision Making Low    Rehab Potential Good    PT Frequency 2x / week   1-2x/wk   PT Duration 4 weeks    PT Treatment/Interventions Cryotherapy;Electrical Stimulation;Moist Heat;Ultrasound;DME Instruction;Gait training;Stair training;Functional mobility training;Therapeutic activities;Therapeutic exercise;Balance training;Neuromuscular re-education;Patient/family education;Manual techniques;Passive range of motion;Dry needling;Vasopneumatic Device;Joint Manipulations    PT Next Visit Plan dry needling as indicated, manual techniques & modalities for pain management, instruct in initial HEP & continue with instruction in well rounded fitness plan    Consulted and Agree with Plan of Care Patient           Patient will benefit from skilled therapeutic intervention in order to improve the following deficits and impairments:  Abnormal gait,  Decreased balance, Decreased mobility, Decreased range of motion, Decreased strength, Increased edema, Impaired flexibility, Postural dysfunction, Pain, Obesity  Visit Diagnosis: Other abnormalities of gait and mobility  Unsteadiness on feet  Weakness generalized  Stiffness of right knee, not elsewhere classified  Acute pain of right knee  Pain in right hip  Localized edema     Problem List Patient Active Problem List   Diagnosis Date Noted  . Degenerative arthritis of left knee 08/02/2019  . Dyspnea on exertion 07/17/2019  . Atypical chest pain 07/17/2019  . Chronic pain of left knee 07/17/2019  . Menopausal symptom 05/26/2018  . OSA on CPAP 05/26/2017  . Routine general medical examination at a health care facility 06/13/2015  . 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 Anemia   . Hypertension 04/04/2012  . Primary hyperparathyroidism ([place]) 01/25/2011  . GERD 01/19/2011  . MICROSCOPIC HEMATURIA 07/02/2009  .  Dyslipidemia 05/08/2009  . ALLERGIC RHINITIS 05/08/2009  . Personal history of colonic polyps 01/04/2008    Jamey Reas PT, DPT 06/10/2020, 7:29 AM  [facility] Physical Therapy [address] [place], [place], [address] Phone: [phone]   Fax:  [phone]  Name: Sarah Santos MRN: [id number] Date of Birth: 05-09-1951

## 2020-06-10 NOTE — Patient Instructions (Signed)
Well-rounded Fitness Program to include 1)endurance / cardio  2)strength  3)flexibility   4)balance With goal exercise to include these components 3x/wk. Some exercises like Yoga can address more than one area.  Obese Body weight negatively impacts arthritic pain. Weekly weighing & tracking all food creates accountability. Water consumption >/=64oz / day.

## 2020-06-16 ENCOUNTER — Other Ambulatory Visit: Payer: Self-pay | Admitting: Obstetrics & Gynecology

## 2020-06-16 DIAGNOSIS — Z1231 Encounter for screening mammogram for malignant neoplasm of breast: Secondary | ICD-10-CM

## 2020-06-23 ENCOUNTER — Ambulatory Visit: Payer: Medicare HMO | Admitting: Physical Therapy

## 2020-06-23 ENCOUNTER — Encounter: Payer: Self-pay | Admitting: Physical Therapy

## 2020-06-23 ENCOUNTER — Ambulatory Visit
Admission: RE | Admit: 2020-06-23 | Discharge: 2020-06-23 | Disposition: A | Discharge status: Home / Self Care | Payer: Medicare HMO | Source: Ambulatory Visit | Attending: Obstetrics & Gynecology | Admitting: Obstetrics & Gynecology

## 2020-06-23 ENCOUNTER — Other Ambulatory Visit: Payer: Self-pay

## 2020-06-23 DIAGNOSIS — R2689 Other abnormalities of gait and mobility: Secondary | ICD-10-CM

## 2020-06-23 DIAGNOSIS — R531 Weakness: Secondary | ICD-10-CM

## 2020-06-23 DIAGNOSIS — Z1231 Encounter for screening mammogram for malignant neoplasm of breast: Secondary | ICD-10-CM | POA: Diagnosis not present

## 2020-06-23 DIAGNOSIS — R2681 Unsteadiness on feet: Secondary | ICD-10-CM

## 2020-06-23 DIAGNOSIS — M25661 Stiffness of right knee, not elsewhere classified: Secondary | ICD-10-CM | POA: Diagnosis not present

## 2020-06-23 DIAGNOSIS — R600 Localized edema: Secondary | ICD-10-CM

## 2020-06-23 DIAGNOSIS — M25551 Pain in right hip: Secondary | ICD-10-CM

## 2020-06-23 DIAGNOSIS — M25561 Pain in right knee: Secondary | ICD-10-CM | POA: Diagnosis not present

## 2020-06-23 NOTE — Patient Instructions (Signed)
Access Code: [email or web address] URL: [email or web address] Date: 06/23/2020 Prepared by: Faustino Congress  Exercises Supine Bridge - 2 x daily - 7 x weekly - 3 sets - 10 reps - 5 sec hold Straight Leg Raise with External Rotation - 2 x daily - 7 x weekly - 3 sets - 10 reps - 2-3 sec hold Hooklying Isometric Clamshell - 2 x daily - 7 x weekly - 15 reps - 1 sets Clamshell - 1 x daily - 7 x weekly - 3 sets - 10 reps Sidelying Hip Abduction - 2 x daily - 7 x weekly - 1 sets - 10 reps Seated Hamstring Stretch - 2 x daily - 7 x weekly - 3 reps - 1 sets - 30 sec hold

## 2020-06-23 NOTE — Therapy (Signed)
[facility] Physical Therapy [address] [place], [place], [address] Phone: [phone]   Fax:  [phone]  Physical Therapy Treatment  Patient Details  Name: Sarah Santos MRN: [id number] Date of Birth: [birth year] Referring Provider (PT): Lynne Leader, MD   Encounter Date: 06/23/2020   PT End of Session - 06/23/20 1347    Visit Number 2    Number of Visits 8    Authorization Type [employer] Medicare    PT Start Time 1300    PT Stop Time 1340    PT Time Calculation (min) 40 min    Equipment Utilized During Treatment Other (comment)   right knee OTC brace   Activity Tolerance Patient tolerated treatment well    Behavior During Therapy [facility] for tasks assessed/performed           Past Medical History:  Diagnosis Date  . ALLERGIC RHINITIS   . 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 GERD   . HEADACHE, CHRONIC   . Heart murmur   . Hematuria, microscopic   . History of anemia   . Hx of adenomatous colonic polyps   . Hypercalcemia   . HYPERLIPIDEMIA   . Hypertension   . OSA on CPAP   . Personal history of colonic polyps 01/04/2008  . Primary hyperparathyroidism [facility])     Past Surgical History:  Procedure Laterality Date  . carpal tunnel Bilateral   . COLONOSCOPY W/ POLYPECTOMY  2009, 08/18/11   2009:adenomas, largest 4 cm, rectal carcinoid, hemorrhoids  . OVARIAN CYST REMOVAL  70's  . TUBAL LIGATION      There were no vitals filed for this visit.   Subjective Assessment - 06/23/20 1305    Subjective knee is feeling better - feels like she can bend it better and lift it up with less pain    Pertinent History rectal carcinoid tumor, heart murmur, HTN, arthritis,    Patient Stated Goals To get rid of pain in right leg to resume daily activities    Currently in Pain? No/denies    Pain Onset 1 to 4 weeks ago                             [facility] Adult PT Treatment/Exercise - 06/23/20 1305       Exercises   Exercises Knee/Hip      Knee/Hip Exercises: Stretches   Passive Hamstring Stretch Right;3 reps;30 seconds    Passive Hamstring Stretch Limitations seated      Knee/Hip Exercises: Aerobic   Recumbent Bike L1 x 5 min      Knee/Hip Exercises: Seated   Long Arc Quad Both;2 sets;10 reps    Long Arc Quad Limitations 3 sec hold; slow eccentrics    Ball Squeeze 20x 5 sec      Knee/Hip Exercises: Supine   Bridges 3 sets;10 reps    Straight Leg Raise with External Rotation Right;3 sets;10 reps    Other Supine Knee/Hip Exercises single limb clamshell 2x10 bil with L4 band      Knee/Hip Exercises: Sidelying   Hip ABduction Right;2 sets;10 reps    Clams Rt 2x10                  PT Education - 06/23/20 1349    Education Details HEP    Person(s) Educated Patient    Methods Explanation;Demonstration;Handout    Comprehension Verbalized understanding;Returned demonstration  PT Long Term Goals - 06/09/20 1723      PT LONG TERM GOAL #1   Title Patient verbalizes & demonstrates understanding of ongoing HEP  / fitness plan including weight management.    Time 4    Period Weeks    Status New    Target Date 07/21/20      PT LONG TERM GOAL #2   Title Patient reports right knee pain >75% improvement.    Time 4    Period Weeks    Status New    Target Date 07/21/20      PT LONG TERM GOAL #3   Title Right knee PROM extension -2* & flexion 110*    Time 4    Period Weeks    Status New    Target Date 07/21/20      PT LONG TERM GOAL #4   Title Right knee strength 5/5 flexion & extension    Time 4    Period Weeks    Status New    Target Date 07/21/20                 Plan - 06/23/20 1347    Clinical Impression Statement Pt tolerated session well today without increase in pain, and session focused on establishing initial HEP.  Pt fatigues quickly with supine exercises for RLE at this time.  Will benefit from PT to maximize function.     Personal Factors and Comorbidities Fitness    Examination-Activity Limitations Locomotion Level;Stairs;Squat;Stand;Transfers    Examination-Participation Restrictions Community Activity    Stability/Clinical Decision Making Stable/Uncomplicated    Rehab Potential Good    PT Frequency 2x / week   1-2x/wk   PT Duration 4 weeks    PT Treatment/Interventions Cryotherapy;Electrical Stimulation;Moist Heat;Ultrasound;DME Instruction;Gait training;Stair training;Functional mobility training;Therapeutic activities;Therapeutic exercise;Balance training;Neuromuscular re-education;Patient/family education;Manual techniques;Passive range of motion;Dry needling;Vasopneumatic Device;Joint Manipulations    PT Next Visit Plan review HEP, manual/modalities PRN, really needs strengthening    PT Home Exercise Plan Access Code: [email or web address]    Consulted and Agree with Plan of Care Patient           Patient will benefit from skilled therapeutic intervention in order to improve the following deficits and impairments:  Abnormal gait, Decreased balance, Decreased mobility, Decreased range of motion, Decreased strength, Increased edema, Impaired flexibility, Postural dysfunction, Pain, Obesity  Visit Diagnosis: Other abnormalities of gait and mobility  Unsteadiness on feet  Stiffness of right knee, not elsewhere classified  Acute pain of right knee  Pain in right hip  Localized edema  Weakness generalized     Problem List Patient Active Problem List   Diagnosis Date Noted  . Degenerative arthritis of left knee 08/02/2019  . Dyspnea on exertion 07/17/2019  . Atypical chest pain 07/17/2019  . Chronic pain of left knee 07/17/2019  . Menopausal symptom 05/26/2018  . OSA on CPAP 05/26/2017  . Routine general medical examination at a health care facility 06/13/2015  . 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 Anemia   . Hypertension 04/04/2012  . Primary  hyperparathyroidism ([place]) 01/25/2011  . GERD 01/19/2011  . MICROSCOPIC HEMATURIA 07/02/2009  . Dyslipidemia 05/08/2009  . ALLERGIC RHINITIS 05/08/2009  . Personal history of colonic polyps 01/04/2008      Laureen Abrahams, PT, DPT 06/23/20 1:51 PM    [facility] Physical Therapy [address] [place], [place], [address] Phone: [phone]   Fax:  [phone]  Name: Sarah Santos MRN: [id number]  Date of Birth: 03-11-1951

## 2020-06-30 ENCOUNTER — Ambulatory Visit: Payer: Medicare HMO | Admitting: Rehabilitative and Restorative Service Providers"

## 2020-06-30 ENCOUNTER — Encounter: Payer: Self-pay | Admitting: Rehabilitative and Restorative Service Providers"

## 2020-06-30 ENCOUNTER — Other Ambulatory Visit: Payer: Self-pay

## 2020-06-30 DIAGNOSIS — R2681 Unsteadiness on feet: Secondary | ICD-10-CM

## 2020-06-30 DIAGNOSIS — M25551 Pain in right hip: Secondary | ICD-10-CM | POA: Diagnosis not present

## 2020-06-30 DIAGNOSIS — M25561 Pain in right knee: Secondary | ICD-10-CM | POA: Diagnosis not present

## 2020-06-30 DIAGNOSIS — M25661 Stiffness of right knee, not elsewhere classified: Secondary | ICD-10-CM

## 2020-06-30 DIAGNOSIS — R600 Localized edema: Secondary | ICD-10-CM

## 2020-06-30 DIAGNOSIS — R2689 Other abnormalities of gait and mobility: Secondary | ICD-10-CM

## 2020-06-30 DIAGNOSIS — R531 Weakness: Secondary | ICD-10-CM | POA: Diagnosis not present

## 2020-06-30 NOTE — Therapy (Signed)
[facility] Physical Therapy [address] [place], [place], [address] Phone: [phone]   Fax:  [phone]  Physical Therapy Treatment  Patient Details  Name: Sarah Santos MRN: [id number] Date of Birth: 01-10-1951 Referring Provider (PT): Lynne Leader, MD   Encounter Date: 06/30/2020   PT End of Session - 06/30/20 1510    Visit Number 3    Number of Visits 8    Authorization Type [employer]    Progress Note Due on Visit 10    PT Start Time 5102    PT Stop Time 1550    PT Time Calculation (min) 39 min    Equipment Utilized During Treatment --   right knee OTC brace   Activity Tolerance Patient tolerated treatment well    Behavior During Therapy [facility] for tasks assessed/performed           Past Medical History:  Diagnosis Date  . ALLERGIC RHINITIS   . 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 GERD   . HEADACHE, CHRONIC   . Heart murmur   . Hematuria, microscopic   . History of anemia   . Hx of adenomatous colonic polyps   . Hypercalcemia   . HYPERLIPIDEMIA   . Hypertension   . OSA on CPAP   . Personal history of colonic polyps 01/04/2008  . Primary hyperparathyroidism [facility])     Past Surgical History:  Procedure Laterality Date  . carpal tunnel Bilateral   . COLONOSCOPY W/ POLYPECTOMY  2009, 08/18/11   2009:adenomas, largest 4 cm, rectal carcinoid, hemorrhoids  . OVARIAN CYST REMOVAL  70's  . TUBAL LIGATION      There were no vitals filed for this visit.   Subjective Assessment - 06/30/20 1514    Subjective Pt. indicated feeling less severe pain overall but still hurting up to 5/10 most days.  Worse in morning and with walking.    Pertinent History rectal carcinoid tumor, heart murmur, HTN, arthritis,    Patient Stated Goals To get rid of pain in right leg to resume daily activities    Currently in Pain? Yes    Pain Score 5     Pain Location Knee    Pain Orientation Right;Anterior    Pain  Descriptors / Indicators Aching    Pain Onset 1 to 4 weeks ago    Pain Frequency Constant    Aggravating Factors  walking/standing, morning worse    Pain Relieving Factors graded movement improves    Effect of Pain on Daily Activities Walking/standing activity                             [facility] Adult PT Treatment/Exercise - 06/30/20 0001      Neuro Re-ed    Neuro Re-ed Details  tandem stance 30 sec x 3 bilat, retro step20x Rt LE      Knee/Hip Exercises: Aerobic   Nustep Lvl 5 6 mins      Knee/Hip Exercises: Standing   Lateral Step Up Both;2 sets;10 reps;Step Height: 4"      Knee/Hip Exercises: Seated   Other Seated Knee/Hip Exercises seated SLR 3 x 10 Rt LE    Sit to Sand without UE support;Other (comment)   eccentric focus 2 x 10 20 inch table     Manual Therapy   Manual therapy comments g2-g3 ap mobs proximal tibia  PT Long Term Goals - 06/30/20 1515      PT LONG TERM GOAL #1   Title Patient verbalizes & demonstrates understanding of ongoing HEP  / fitness plan including weight management.    Time 4    Period Weeks    Status On-going    Target Date 07/21/20      PT LONG TERM GOAL #2   Title Patient reports right knee pain >75% improvement.    Time 4    Period Weeks    Status On-going    Target Date 07/21/20      PT LONG TERM GOAL #3   Title Right knee PROM extension -2* & flexion 110*    Time 4    Period Weeks    Status On-going    Target Date 07/21/20      PT LONG TERM GOAL #4   Title Right knee strength 5/5 flexion & extension    Time 4    Period Weeks    Status On-going    Target Date 07/21/20                 Plan - 06/30/20 1532    Clinical Impression Statement Assessment of ROM equal to Lt knee at this time c mild tightness at end range reported on Rt knee.  Continued strengthening indicated c transition to WB loading to improve functional activity.    Personal Factors and Comorbidities  Fitness    Examination-Activity Limitations Locomotion Level;Stairs;Squat;Stand;Transfers    Examination-Participation Restrictions Community Activity    Stability/Clinical Decision Making Stable/Uncomplicated    Rehab Potential Good    PT Frequency 2x / week   1-2x/wk   PT Duration 4 weeks    PT Treatment/Interventions Cryotherapy;Electrical Stimulation;Moist Heat;Ultrasound;DME Instruction;Gait training;Stair training;Functional mobility training;Therapeutic activities;Therapeutic exercise;Balance training;Neuromuscular re-education;Patient/family education;Manual techniques;Passive range of motion;Dry needling;Vasopneumatic Device;Joint Manipulations    PT Next Visit Plan WB strengthening, balance control.    PT Home Exercise Plan Access Code: [email or web address]    Consulted and Agree with Plan of Care Patient           Patient will benefit from skilled therapeutic intervention in order to improve the following deficits and impairments:  Abnormal gait, Decreased balance, Decreased mobility, Decreased range of motion, Decreased strength, Increased edema, Impaired flexibility, Postural dysfunction, Pain, Obesity  Visit Diagnosis: Acute pain of right knee  Stiffness of right knee, not elsewhere classified  Pain in right hip  Localized edema  Unsteadiness on feet  Other abnormalities of gait and mobility  Weakness generalized     Problem List Patient Active Problem List   Diagnosis Date Noted  . Degenerative arthritis of left knee 08/02/2019  . Dyspnea on exertion 07/17/2019  . Atypical chest pain 07/17/2019  . Chronic pain of left knee 07/17/2019  . Menopausal symptom 05/26/2018  . OSA on CPAP 05/26/2017  . Routine general medical examination at a health care facility 06/13/2015  . 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 Anemia   . Hypertension 04/04/2012  . Primary hyperparathyroidism ([place]) 01/25/2011  . GERD 01/19/2011  . MICROSCOPIC  HEMATURIA 07/02/2009  . Dyslipidemia 05/08/2009  . ALLERGIC RHINITIS 05/08/2009  . Personal history of colonic polyps 01/04/2008   Scot Jun, PT, DPT, OCS, ATC 06/30/20  3:54 PM    [facility] Physical Therapy [address] [place], [place], [address] Phone: [phone]   Fax:  [phone]  Name: Sarah Santos MRN: [id number] Date of Birth: 1951-11-11

## 2020-07-02 ENCOUNTER — Ambulatory Visit: Payer: Medicare HMO | Admitting: Family Medicine

## 2020-07-07 ENCOUNTER — Encounter: Payer: Medicare HMO | Admitting: Physical Therapy

## 2020-07-07 ENCOUNTER — Telehealth: Payer: Self-pay | Admitting: Physical Therapy

## 2020-07-07 NOTE — Telephone Encounter (Signed)
LVM for pt as she NS for appt today.  Reminded of next appt and to call if she needed to cx.  Laureen Abrahams, PT, DPT 07/07/20 1:20 PM

## 2020-07-21 ENCOUNTER — Other Ambulatory Visit: Payer: Self-pay

## 2020-07-21 ENCOUNTER — Ambulatory Visit: Payer: Medicare HMO | Admitting: Physical Therapy

## 2020-07-21 ENCOUNTER — Encounter: Payer: Self-pay | Admitting: Physical Therapy

## 2020-07-21 DIAGNOSIS — R531 Weakness: Secondary | ICD-10-CM

## 2020-07-21 DIAGNOSIS — M25551 Pain in right hip: Secondary | ICD-10-CM

## 2020-07-21 DIAGNOSIS — M25561 Pain in right knee: Secondary | ICD-10-CM

## 2020-07-21 DIAGNOSIS — R2689 Other abnormalities of gait and mobility: Secondary | ICD-10-CM

## 2020-07-21 DIAGNOSIS — M25661 Stiffness of right knee, not elsewhere classified: Secondary | ICD-10-CM

## 2020-07-21 DIAGNOSIS — R600 Localized edema: Secondary | ICD-10-CM | POA: Diagnosis not present

## 2020-07-21 DIAGNOSIS — R2681 Unsteadiness on feet: Secondary | ICD-10-CM | POA: Diagnosis not present

## 2020-07-21 NOTE — Patient Instructions (Signed)
Access Code: [email or web address] URL: [email or web address] Date: 07/21/2020 Prepared by: Faustino Congress  Exercises Supine Bridge - 2 x daily - 7 x weekly - 3 sets - 10 reps - 5 sec hold Straight Leg Raise with External Rotation - 2 x daily - 7 x weekly - 3 sets - 10 reps - 2-3 sec hold Hooklying Isometric Clamshell - 2 x daily - 7 x weekly - 15 reps - 1 sets Clamshell - 1 x daily - 7 x weekly - 3 sets - 10 reps Sidelying Hip Abduction - 2 x daily - 7 x weekly - 1 sets - 10 reps Seated Hamstring Stretch - 2 x daily - 7 x weekly - 3 reps - 1 sets - 30 sec hold Sumo Squat with Dumbbell - 1 x daily - 7 x weekly - 2 sets - 10 reps Half Dead Lift with Kettlebell - 1 x daily - 7 x weekly - 2 sets - 10 reps

## 2020-07-21 NOTE — Therapy (Signed)
[facility] Physical Therapy [address] [place], [place], [address] Phone: [phone]   Fax:  [phone]  Physical Therapy Treatment/Discharge Summary  Patient Details  Name: Sarah Santos MRN: [id number] Date of Birth: [birth year] Referring Provider (PT): Lynne Leader, MD   Encounter Date: 07/21/2020   PT End of Session - 07/21/20 1342    Visit Number 4    Authorization Type [employer]    Progress Note Due on Visit 10    PT Start Time 1300    PT Stop Time 2778    PT Time Calculation (min) 38 min    Equipment Utilized During Treatment --   right knee OTC brace   Activity Tolerance Patient tolerated treatment well    Behavior During Therapy [facility] for tasks assessed/performed           Past Medical History:  Diagnosis Date   ALLERGIC RHINITIS    Carcinoid tumor of colon 2009   rectum - removed with colonoscopy   GERD    HEADACHE, CHRONIC    Heart murmur    Hematuria, microscopic    History of anemia    Hx of adenomatous colonic polyps    Hypercalcemia    HYPERLIPIDEMIA    Hypertension    OSA on CPAP    Personal history of colonic polyps 01/04/2008   Primary hyperparathyroidism ([place])     Past Surgical History:  Procedure Laterality Date   carpal tunnel Bilateral    COLONOSCOPY W/ POLYPECTOMY  2009, 08/18/11   2009:adenomas, largest 4 cm, rectal carcinoid, hemorrhoids   OVARIAN CYST REMOVAL  70's   TUBAL LIGATION      There were no vitals filed for this visit.   Subjective Assessment - 07/21/20 1301    Subjective Knee is feeling better - was on vacation last scheduled visit. feels 90% better    Pertinent History rectal carcinoid tumor, heart murmur, HTN, arthritis,    Patient Stated Goals To get rid of pain in right leg to resume daily activities    Currently in Pain? No/denies    Pain Onset 1 to 4 weeks ago              [facility] PT Assessment - 07/21/20 1332      Assessment   Medical Diagnosis Right  Knee pain    Referring Provider (PT) Lynne Leader, MD    Onset Date/Surgical Date 05/27/20      AROM   Right Knee Extension 2    Right Knee Flexion 125      Strength   Right Knee Flexion 5/5    Right Knee Extension 5/5                         [facility] Adult PT Treatment/Exercise - 07/21/20 1304      Knee/Hip Exercises: Stretches   Passive Hamstring Stretch Right;3 reps;30 seconds    Passive Hamstring Stretch Limitations seated      Knee/Hip Exercises: Aerobic   Nustep Lvl 5 8 mins      Knee/Hip Exercises: Standing   Functional Squat 2 sets;10 reps   10#   Other Standing Knee Exercises deadlifts 2x10; 10#      Knee/Hip Exercises: Supine   Bridges 2 sets;10 reps    Straight Leg Raise with External Rotation Right;2 sets;10 reps    Other Supine Knee/Hip Exercises single limb clamshell 2x10 bil with L4 band      Knee/Hip Exercises: Sidelying   Hip ABduction Right;2  sets;10 reps    Clams Rt 2x10                  PT Education - 07/21/20 1346    Education Details added to HEP, discussed safe gym equipment/exercises    Person(s) Educated Patient    Methods Explanation;Demonstration;Handout    Comprehension Verbalized understanding;Returned demonstration               PT Long Term Goals - 07/21/20 1346      PT LONG TERM GOAL #1   Title Patient verbalizes & demonstrates understanding of ongoing HEP  / fitness plan including weight management.    Time 4    Period Weeks    Status Achieved      PT LONG TERM GOAL #2   Title Patient reports right knee pain >75% improvement.    Time 4    Period Weeks    Status Achieved      PT LONG TERM GOAL #3   Title Right knee PROM extension -2* & flexion 110*    Time 4    Period Weeks    Status Achieved      PT LONG TERM GOAL #4   Title Right knee strength 5/5 flexion & extension    Time 4    Period Weeks    Status Achieved                 Plan - 07/21/20 1347    Clinical Impression Statement  Pt has met all goals and is ready for d/c at this time.  She has an established HEP and goes regularly to the gym and overall pain has been very minimal.  Will d/c PT today.    Personal Factors and Comorbidities Fitness    Examination-Activity Limitations Locomotion Level;Stairs;Squat;Stand;Transfers    Examination-Participation Restrictions Community Activity    Stability/Clinical Decision Making Stable/Uncomplicated    Rehab Potential Good    PT Frequency 2x / week   1-2x/wk   PT Duration 4 weeks    PT Treatment/Interventions Cryotherapy;Electrical Stimulation;Moist Heat;Ultrasound;DME Instruction;Gait training;Stair training;Functional mobility training;Therapeutic activities;Therapeutic exercise;Balance training;Neuromuscular re-education;Patient/family education;Manual techniques;Passive range of motion;Dry needling;Vasopneumatic Device;Joint Manipulations    PT Next Visit Plan d/c PT today    PT Home Exercise Plan Access Code: [email or web address]    Consulted and Agree with Plan of Care Patient           Patient will benefit from skilled therapeutic intervention in order to improve the following deficits and impairments:  Abnormal gait, Decreased balance, Decreased mobility, Decreased range of motion, Decreased strength, Increased edema, Impaired flexibility, Postural dysfunction, Pain, Obesity  Visit Diagnosis: Acute pain of right knee  Stiffness of right knee, not elsewhere classified  Pain in right hip  Localized edema  Unsteadiness on feet  Other abnormalities of gait and mobility  Weakness generalized     Problem List Patient Active Problem List   Diagnosis Date Noted   Degenerative arthritis of left knee 08/02/2019   Dyspnea on exertion 07/17/2019   Atypical chest pain 07/17/2019   Chronic pain of left knee 07/17/2019   Menopausal symptom 05/26/2018   OSA on CPAP 05/26/2017   Routine general medical examination at a health care facility 06/13/2015   Elevated  total protein 11/26/2014   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   Anemia    Hypertension 04/04/2012   Primary hyperparathyroidism ([place]) 01/25/2011   GERD 01/19/2011   MICROSCOPIC HEMATURIA 07/02/2009   Dyslipidemia 05/08/2009   ALLERGIC RHINITIS 05/08/2009  Personal history of colonic polyps 01/04/2008      Laureen Abrahams, PT, DPT 07/21/20 1:49 PM    [facility] Physical Therapy [address] [place], [place], [address] Phone: [phone]   Fax:  [phone]  Name: Sarah Santos MRN: [id number] Date of Birth: 10-Aug-1951     PHYSICAL THERAPY DISCHARGE SUMMARY  Visits from Start of Care: 4  Current functional level related to goals / functional outcomes: See above   Remaining deficits: See above   Education / Equipment: HEP  Plan: Patient agrees to discharge.  Patient goals were met. Patient is being discharged due to meeting the stated rehab goals.  ?????     Laureen Abrahams, PT, DPT 07/21/20 1:57 PM  [facility] Therapy [address] [place], [place], [address] Phone: [phone]   Fax:  [phone]

## 2020-07-28 ENCOUNTER — Encounter: Payer: Medicare HMO | Admitting: Physical Therapy

## 2020-09-01 ENCOUNTER — Other Ambulatory Visit: Payer: Self-pay

## 2020-09-01 ENCOUNTER — Ambulatory Visit (INDEPENDENT_AMBULATORY_CARE_PROVIDER_SITE_OTHER): Payer: Medicare HMO | Admitting: Obstetrics & Gynecology

## 2020-09-01 ENCOUNTER — Encounter: Payer: Self-pay | Admitting: Obstetrics & Gynecology

## 2020-09-01 VITALS — BP 120/70 | Ht 60.5 in | Wt 211.0 lb

## 2020-09-01 DIAGNOSIS — Z780 Asymptomatic menopausal state: Secondary | ICD-10-CM

## 2020-09-01 DIAGNOSIS — M8589 Other specified disorders of bone density and structure, multiple sites: Secondary | ICD-10-CM

## 2020-09-01 DIAGNOSIS — Z6841 Body Mass Index (BMI) 40.0 and over, adult: Secondary | ICD-10-CM | POA: Diagnosis not present

## 2020-09-01 DIAGNOSIS — Z124 Encounter for screening for malignant neoplasm of cervix: Secondary | ICD-10-CM | POA: Diagnosis not present

## 2020-09-01 DIAGNOSIS — Z01419 Encounter for gynecological examination (general) (routine) without abnormal findings: Secondary | ICD-10-CM | POA: Diagnosis not present

## 2020-09-01 NOTE — Progress Notes (Signed)
Sarah Santos 10-Nov-1951 [id number]   History:    69 y.o. G3P2A1L2   RP:  Established patient presenting for annual gyn exam   HPI: Menopause, well on no hormone replacement therapy.  No postmenopausal bleeding.  No pelvic pain.  Abstinent.  Urine and bowel movements normal.  Breasts normal.  Body mass index increased to 40.53.  Back to the gym twice a week.  Good nutrition, will go on a low calorie/carb diet.  Health labs with family physician.  Past medical history,surgical history, family history and social history were all reviewed and documented in the EPIC chart.  Gynecologic History No LMP recorded. Patient is postmenopausal.  Obstetric History OB History  Gravida Para Term Preterm AB Living  3 2     1 2   SAB TAB Ectopic Multiple Live Births               # Outcome Date GA Lbr Len/2nd Weight Sex Delivery Anes PTL Lv  3 AB           2 Para           1 Para              ROS: A ROS was performed and pertinent positives and negatives are included in the history.  GENERAL: No fevers or chills. HEENT: No change in vision, no earache, sore throat or sinus congestion. NECK: No pain or stiffness. CARDIOVASCULAR: No chest pain or pressure. No palpitations. PULMONARY: No shortness of breath, cough or wheeze. GASTROINTESTINAL: No abdominal pain, nausea, vomiting or diarrhea, melena or bright red blood per rectum. GENITOURINARY: No urinary frequency, urgency, hesitancy or dysuria. MUSCULOSKELETAL: No joint or muscle pain, no back pain, no recent trauma. DERMATOLOGIC: No rash, no itching, no lesions. ENDOCRINE: No polyuria, polydipsia, no heat or cold intolerance. No recent change in weight. HEMATOLOGICAL: No anemia or easy bruising or bleeding. NEUROLOGIC: No headache, seizures, numbness, tingling or weakness. PSYCHIATRIC: No depression, no loss of interest in normal activity or change in sleep pattern.     Exam:   BP 120/70   Ht 5' 0.5" (1.537 m)   Wt 211 lb (95.7 kg)    BMI 40.53 kg/m   Body mass index is 40.53 kg/m.  General appearance : Well developed well nourished female. No acute distress HEENT: Eyes: no retinal hemorrhage or exudates,  Neck supple, trachea midline, no carotid bruits, no thyroidmegaly Lungs: Clear to auscultation, no rhonchi or wheezes, or rib retractions  Heart: Regular rate and rhythm, no murmurs or gallops Breast:Examined in sitting and supine position were symmetrical in appearance, no palpable masses or tenderness,  no skin retraction, no nipple inversion, no nipple discharge, no skin discoloration, no axillary or supraclavicular lymphadenopathy Abdomen: no palpable masses or tenderness, no rebound or guarding Extremities: no edema or skin discoloration or tenderness  Pelvic: Vulva: Normal             Vagina: No gross lesions or discharge  Cervix: No gross lesions or discharge.  Pap reflex done.  Uterus  AV, normal size, shape and consistency, non-tender and mobile  Adnexa  Without masses or tenderness  Anus: Normal   Assessment/Plan:  69 y.o. female for annual exam   1. Encounter for routine gynecological examination with Papanicolaou smear of cervix Normal gynecologic exam in menopause.  Pap reflex done.  Breast exam normal.  Screening mammogram June 2021 was negative.  Colonoscopy 2018.  Health labs with family physician.  2. Postmenopausal Well on no  hormone replacement therapy.  No postmenopausal bleeding.  3. Osteopenia of multiple sites Osteopenia on last bone density April 2018.  Will reschedule a bone density at [facility] through her family physician.  Vitamin D supplements, calcium intake of 1200 to 1500 mg daily and regular weightbearing physical activity is recommended.  4. Class 3 severe obesity due to excess calories with serious comorbidity and body mass index (BMI) of 40.0 to 44.9 in adult [facility]) Will restart on a low calorie/carb diet.  Aerobic activities 5 times a week and light weightlifting every 2  days recommended.  Princess Bruins MD, 2:22 PM 09/01/2020

## 2020-09-01 NOTE — Addendum Note (Signed)
Addended by: Thurnell Garbe A on: 09/01/2020 03:19 PM   Modules accepted: Orders

## 2020-09-02 DIAGNOSIS — Z01419 Encounter for gynecological examination (general) (routine) without abnormal findings: Secondary | ICD-10-CM | POA: Diagnosis not present

## 2020-09-03 LAB — PAP IG W/ RFLX HPV ASCU

## 2020-10-10 ENCOUNTER — Ambulatory Visit (INDEPENDENT_AMBULATORY_CARE_PROVIDER_SITE_OTHER): Payer: Medicare HMO

## 2020-10-10 DIAGNOSIS — Z23 Encounter for immunization: Secondary | ICD-10-CM | POA: Diagnosis not present

## 2020-10-12 DIAGNOSIS — Z862 Personal history of diseases of the blood and blood-forming organs and certain disorders involving the immune mechanism: Secondary | ICD-10-CM | POA: Insufficient documentation

## 2020-10-12 DIAGNOSIS — Z86010 Personal history of colonic polyps: Secondary | ICD-10-CM | POA: Insufficient documentation

## 2020-10-12 DIAGNOSIS — R3129 Other microscopic hematuria: Secondary | ICD-10-CM | POA: Insufficient documentation

## 2020-10-12 DIAGNOSIS — R011 Cardiac murmur, unspecified: Secondary | ICD-10-CM | POA: Insufficient documentation

## 2020-10-13 ENCOUNTER — Ambulatory Visit: Payer: Medicare HMO | Admitting: Cardiology

## 2020-10-13 ENCOUNTER — Other Ambulatory Visit: Payer: Self-pay

## 2020-10-13 ENCOUNTER — Encounter: Payer: Self-pay | Admitting: Cardiology

## 2020-10-13 VITALS — BP 142/70 | HR 84 | Ht 60.5 in | Wt 211.0 lb

## 2020-10-13 DIAGNOSIS — R0609 Other forms of dyspnea: Secondary | ICD-10-CM

## 2020-10-13 DIAGNOSIS — E782 Mixed hyperlipidemia: Secondary | ICD-10-CM | POA: Diagnosis not present

## 2020-10-13 DIAGNOSIS — R0600 Dyspnea, unspecified: Secondary | ICD-10-CM | POA: Diagnosis not present

## 2020-10-13 DIAGNOSIS — I10 Essential (primary) hypertension: Secondary | ICD-10-CM | POA: Diagnosis not present

## 2020-10-13 NOTE — Progress Notes (Signed)
Cardiology Office Note:    Date:  10/13/2020   ID:  Sarah Santos, DOB May 17, 1951, MRN [id number]  PCP:  Hoyt Koch, MD  Cardiologist:  Shirlee More, MD    Referring MD: Hoyt Koch, *    ASSESSMENT:    1. Dyspnea on exertion   2. Essential hypertension   3. Mixed hyperlipidemia    PLAN:    In order of problems listed above:  1. Her echo and perfusion study showed no evidence of heart failure or CAD. 2. Stable at target continue ARB diuretic 3. Continue statin I will check labs including lipid profile CMP hemoglobin A1c at her request   Next appointment: I will see back in the future as needed   Medication Adjustments/Labs and Tests Ordered: Current medicines are reviewed at length with the patient today.  Concerns regarding medicines are outlined above.  Orders Placed This Encounter  Procedures  . Comprehensive metabolic panel  . Lipid panel  . HgB A1c   No orders of the defined types were placed in this encounter.   Follow-up hypertension  History of Present Illness:    Sarah Santos is a 69 y.o. female with a hx of shortness of breath hypertension and obstructive sleep apnea along with anemia hemoglobin 10.9 and coronary artery calcification.  She also has mild hypercalcemia and was last seen 01/30/2020.  Following that visit she underwent a myocardial perfusion test that was normal perfusion and function EF 71%.  Echocardiogram 08/13/2019 showed normal GLS ejection fraction normal left ventricular filling pressures left atrial enlargement and no other abnormality.  Chest CT 07/26/2019 showed calcification in thoracic aorta abnormality with mild groundglass opacification in the left lung base likely representing atelectasis. Compliance with diet, lifestyle and medications: Yes  Time she has dizziness not associated with blood pressure.  No chest pain edema shortness of breath palpitation or syncope. Past Medical History:    Diagnosis Date  . ALLERGIC RHINITIS   . Anemia   . Atypical chest pain 07/17/2019  . 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 Chronic pain of left knee 07/17/2019  . Degenerative arthritis of left knee 08/02/2019  . Dyslipidemia 05/08/2009    On Simvastatin   . Dyspnea on exertion 07/17/2019  . 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 GERD   . HEADACHE, CHRONIC   . Heart murmur   . Hematuria, microscopic   . History of anemia   . Hx of adenomatous colonic polyps   . Hypercalcemia   . HYPERLIPIDEMIA   . Hypertension   . 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 Menopausal symptom 05/26/2018  . Microscopic hematuria 07/02/2009   Qualifier: Diagnosis of  By: Asa Lente MD, Valerie A   . OSA on CPAP   . Personal history of colonic polyps 01/04/2008  . Primary hyperparathyroidism ([place])   . Routine general medical examination at a health care facility 06/13/2015    Past Surgical History:  Procedure Laterality Date  . carpal tunnel Bilateral   . COLONOSCOPY W/ POLYPECTOMY  2009, 08/18/11   2009:adenomas, largest 4 cm, rectal carcinoid, hemorrhoids  . OVARIAN CYST REMOVAL  70's  . TUBAL LIGATION      Current Medications: Current Meds  Medication Sig  . acetaminophen (TYLENOL) 500 MG tablet Take 500 mg by mouth every 6 (six) hours as needed.  . Glucosamine HCl (GLUCOSAMINE PO) Take by mouth.  . loratadine (CLARITIN) 10 MG tablet Take 10 mg by mouth daily as  needed for allergies.  Marland Kitchen losartan (COZAAR) 50 MG tablet TAKE 1 TABLET EVERY DAY  . losartan-hydrochlorothiazide (HYZAAR) 50-12.5 MG tablet TAKE 1 TABLET EVERY DAY  . pantoprazole (PROTONIX) 40 MG tablet TAKE 1 TABLET (40 MG TOTAL) BY MOUTH DAILY.  . simvastatin (ZOCOR) 20 MG tablet TAKE 1 TABLET BY MOUTH DAILY AT 6 PM. (Patient taking differently: Take 20 mg by mouth 2 (two) times a week. )  . Turmeric (QC TUMERIC COMPLEX PO) Take by mouth.     Allergies:   Patient has no known allergies.   Social History    Socioeconomic History  . Marital status: Divorced    Spouse name: Not on file  . Number of children: Not on file  . Years of education: Not on file  . Highest education level: Not on file  Occupational History  . Not on file  Tobacco Use  . Smoking status: Former Smoker    Types: Cigarettes    Quit date: 1981    Years since quitting: 40.8  . Smokeless tobacco: Never Used  Vaping Use  . Vaping Use: Never used  Substance and Sexual Activity  . Alcohol use: No  . Drug use: No  . Sexual activity: Not Currently    Birth control/protection: Post-menopausal, Surgical  Other Topics Concern  . 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 Difficulty of Paying Living Expenses: Not on file  Food Insecurity:   . Worried About Charity fundraiser in the Last Year: Not on file  . Ran Out of Food in the Last Year: Not on file  Transportation Needs:   . Lack of Transportation (Medical): Not on file  . Lack of Transportation (Non-Medical): Not on file  Physical Activity:   . Days of Exercise per Week: Not on file  . Minutes of Exercise per Session: Not on file  Stress:   . Feeling of Stress : Not on file  Social Connections:   . Frequency of Communication with Friends and Family: Not on file  . Frequency of Social Gatherings with Friends and Family: Not on file  . Attends Religious Services: Not on file  . Active Member of Clubs or Organizations: Not on file  . Attends Archivist Meetings: Not on file  . Marital Status: Not on file     Family History: The patient's family history includes Cancer in an other family member; Diabetes in her brother; Heart disease in her brother and another family member; Heart disease (age of onset: 47) in her mother; Hyperlipidemia in her brother; Pulmonary embolism in her mother; Stomach cancer in her paternal aunt; Stroke in her  father. There is no history of Colon cancer, Colon polyps, Other, Esophageal cancer, or Rectal cancer. ROS:   Please see the history of present illness.    All other systems reviewed and are negative.  EKGs/Labs/Other Studies Reviewed:    The following studies were reviewed today:   Recent Labs: 02/18/2020: ALT 18; BUN 17; Creatinine, Ser 1.02; Hemoglobin 10.4; Platelets 277.0; Potassium 3.5; Sodium 136  Recent Lipid Panel    Component Value Date/Time   CHOL 212 (H) 02/18/2020 1425   TRIG 82.0 02/18/2020 1425   HDL 62.30 02/18/2020 1425   CHOLHDL 3 02/18/2020 1425   VLDL 16.4 02/18/2020 1425   LDLCALC 133 (H) 02/18/2020 1425   LDLDIRECT 156.9 02/01/2012 1442  Physical Exam:    VS:  BP (!) 142/70 (BP Location: Right Arm, Patient Position: Sitting, Cuff Size: Normal)   Pulse 84   Ht 5' 0.5" (1.537 m)   Wt 211 lb (95.7 kg)   SpO2 98%   BMI 40.53 kg/m     Wt Readings from Last 3 Encounters:  10/13/20 211 lb (95.7 kg)  09/01/20 211 lb (95.7 kg)  05/27/20 210 lb 6.4 oz (95.4 kg)    Repeat blood pressure by me 130/64 GEN:  Well nourished, well developed in no acute distress HEENT: Normal NECK: No JVD; No carotid bruits LYMPHATICS: No lymphadenopathy CARDIAC: RRR, no murmurs, rubs, gallops RESPIRATORY:  Clear to auscultation without rales, wheezing or rhonchi  ABDOMEN: Soft, non-tender, non-distended MUSCULOSKELETAL:  No edema; No deformity  SKIN: Warm and dry NEUROLOGIC:  Alert and oriented x 3 PSYCHIATRIC:  Normal affect    Signed, Shirlee More, MD  10/13/2020 2:04 PM    [place]

## 2020-10-13 NOTE — Patient Instructions (Signed)
Medication Instructions:  Your physician recommends that you continue on your current medications as directed. Please refer to the Current Medication list given to you today.  *If you need a refill on your cardiac medications before your next appointment, please call your pharmacy*   Lab Work: Your physician recommends that you return for lab work in: TODAY CMP, Lipids, Hgb A1C If you have labs (blood work) drawn today and your tests are completely normal, you will receive your results only by: Marland Kitchen MyChart Message (if you have MyChart) OR . A paper copy in the mail If you have any lab test that is abnormal or we need to change your treatment, we will call you to review the results.   Testing/Procedures: None   Follow-Up: At [facility], you and your health needs are our priority.  As part of our continuing mission to provide you with exceptional heart care, we have created designated Provider Care Teams.  These Care Teams include your primary Cardiologist (physician) and Advanced Practice Providers (APPs -  Physician Assistants and Nurse Practitioners) who all work together to provide you with the care you need, when you need it.  We recommend signing up for the patient portal called "MyChart".  Sign up information is provided on this After Visit Summary.  MyChart is used to connect with patients for Virtual Visits (Telemedicine).  Patients are able to view lab/test results, encounter notes, upcoming appointments, etc.  Non-urgent messages can be sent to your provider as well.   To learn more about what you can do with MyChart, go to [email or web address].    Your next appointment:   As needed  The format for your next appointment:   In Person  Provider:   Shirlee More, MD   Other Instructions

## 2020-10-14 ENCOUNTER — Telehealth: Payer: Self-pay

## 2020-10-14 DIAGNOSIS — H5712 Ocular pain, left eye: Secondary | ICD-10-CM | POA: Diagnosis not present

## 2020-10-14 DIAGNOSIS — H11432 Conjunctival hyperemia, left eye: Secondary | ICD-10-CM | POA: Diagnosis not present

## 2020-10-14 DIAGNOSIS — H10812 Pingueculitis, left eye: Secondary | ICD-10-CM | POA: Diagnosis not present

## 2020-10-14 LAB — COMPREHENSIVE METABOLIC PANEL
ALT: 15 IU/L (ref 0–32)
AST: 14 IU/L (ref 0–40)
Albumin/Globulin Ratio: 1.3 (ref 1.2–2.2)
Albumin: 4.3 g/dL (ref 3.8–4.8)
Alkaline Phosphatase: 116 IU/L (ref 44–121)
BUN/Creatinine Ratio: 15 (ref 12–28)
BUN: 16 mg/dL (ref 8–27)
Bilirubin Total: 0.2 mg/dL (ref 0.0–1.2)
CO2: 26 mmol/L (ref 20–29)
Calcium: 10.8 mg/dL — ABNORMAL HIGH (ref 8.7–10.3)
Chloride: 101 mmol/L (ref 96–106)
Creatinine, Ser: 1.05 mg/dL — ABNORMAL HIGH (ref 0.57–1.00)
GFR calc Af Amer: 63 mL/min/{1.73_m2} (ref >59)
GFR calc non Af Amer: 55 mL/min/{1.73_m2} — ABNORMAL LOW (ref >59)
Globulin, Total: 3.2 g/dL (ref 1.5–4.5)
Glucose: 91 mg/dL (ref 65–99)
Potassium: 4.1 mmol/L (ref 3.5–5.2)
Sodium: 140 mmol/L (ref 134–144)
Total Protein: 7.5 g/dL (ref 6.0–8.5)

## 2020-10-14 LAB — LIPID PANEL
Chol/HDL Ratio: 3.7 ratio (ref 0.0–4.4)
Cholesterol, Total: 209 mg/dL — ABNORMAL HIGH (ref 100–199)
HDL: 57 mg/dL (ref >39)
LDL Chol Calc (NIH): 127 mg/dL — ABNORMAL HIGH (ref 0–99)
Triglycerides: 143 mg/dL (ref 0–149)
VLDL Cholesterol Cal: 25 mg/dL (ref 5–40)

## 2020-10-14 LAB — HEMOGLOBIN A1C
Est. average glucose Bld gHb Est-mCnc: 131 mg/dL
Hgb A1c MFr Bld: 6.2 % — ABNORMAL HIGH (ref 4.8–5.6)

## 2020-10-14 NOTE — Telephone Encounter (Signed)
-----   Message from Richardo Priest, MD sent at 10/14/2020  7:32 AM EDT ----- Stable no changes

## 2020-10-14 NOTE — Telephone Encounter (Signed)
Spoke with patient regarding results and recommendation.  Patient verbalizes understanding and is agreeable to plan of care. Advised patient to call back with any issues or concerns.  

## 2020-12-03 DIAGNOSIS — D3102 Benign neoplasm of left conjunctiva: Secondary | ICD-10-CM | POA: Diagnosis not present

## 2020-12-03 DIAGNOSIS — Z83511 Family history of glaucoma: Secondary | ICD-10-CM | POA: Diagnosis not present

## 2020-12-03 DIAGNOSIS — H04123 Dry eye syndrome of bilateral lacrimal glands: Secondary | ICD-10-CM | POA: Diagnosis not present

## 2020-12-03 DIAGNOSIS — H524 Presbyopia: Secondary | ICD-10-CM | POA: Diagnosis not present

## 2020-12-03 DIAGNOSIS — H2513 Age-related nuclear cataract, bilateral: Secondary | ICD-10-CM | POA: Diagnosis not present

## 2020-12-03 DIAGNOSIS — H43812 Vitreous degeneration, left eye: Secondary | ICD-10-CM | POA: Diagnosis not present

## 2020-12-03 DIAGNOSIS — H52203 Unspecified astigmatism, bilateral: Secondary | ICD-10-CM | POA: Diagnosis not present

## 2020-12-03 DIAGNOSIS — H11153 Pinguecula, bilateral: Secondary | ICD-10-CM | POA: Diagnosis not present

## 2020-12-03 DIAGNOSIS — H5203 Hypermetropia, bilateral: Secondary | ICD-10-CM | POA: Diagnosis not present

## 2020-12-24 ENCOUNTER — Other Ambulatory Visit: Payer: Self-pay | Admitting: Internal Medicine

## 2020-12-25 DIAGNOSIS — H52223 Regular astigmatism, bilateral: Secondary | ICD-10-CM | POA: Diagnosis not present

## 2020-12-25 DIAGNOSIS — H524 Presbyopia: Secondary | ICD-10-CM | POA: Diagnosis not present

## 2021-02-08 ENCOUNTER — Ambulatory Visit (INDEPENDENT_AMBULATORY_CARE_PROVIDER_SITE_OTHER): Payer: Medicare HMO | Admitting: Internal Medicine

## 2021-02-08 ENCOUNTER — Encounter: Payer: Self-pay | Admitting: Internal Medicine

## 2021-02-08 ENCOUNTER — Other Ambulatory Visit: Payer: Self-pay

## 2021-02-08 VITALS — BP 138/78 | HR 91 | Temp 98.2°F | Resp 18 | Ht 60.5 in | Wt 198.4 lb

## 2021-02-08 DIAGNOSIS — M25511 Pain in right shoulder: Secondary | ICD-10-CM | POA: Diagnosis not present

## 2021-02-08 DIAGNOSIS — I10 Essential (primary) hypertension: Secondary | ICD-10-CM | POA: Diagnosis not present

## 2021-02-08 LAB — COMPREHENSIVE METABOLIC PANEL
ALT: 16 U/L (ref 0–35)
AST: 17 U/L (ref 0–37)
Albumin: 4.5 g/dL (ref 3.5–5.2)
Alkaline Phosphatase: 100 U/L (ref 39–117)
BUN: 15 mg/dL (ref 6–23)
CO2: 29 mEq/L (ref 19–32)
Calcium: 11.2 mg/dL — ABNORMAL HIGH (ref 8.4–10.5)
Chloride: 102 mEq/L (ref 96–112)
Creatinine, Ser: 0.96 mg/dL (ref 0.40–1.20)
GFR: 60.58 mL/min (ref >60.00)
Glucose, Bld: 80 mg/dL (ref 70–99)
Potassium: 3.7 mEq/L (ref 3.5–5.1)
Sodium: 138 mEq/L (ref 135–145)
Total Bilirubin: 0.4 mg/dL (ref 0.2–1.2)
Total Protein: 8.5 g/dL — ABNORMAL HIGH (ref 6.0–8.3)

## 2021-02-08 LAB — CBC
HCT: 34.5 % — ABNORMAL LOW (ref 36.0–46.0)
Hemoglobin: 11.1 g/dL — ABNORMAL LOW (ref 12.0–15.0)
MCHC: 32.1 g/dL (ref 30.0–36.0)
MCV: 79.4 fl (ref 78.0–100.0)
Platelets: 290 10*3/uL (ref 150.0–400.0)
RBC: 4.34 Mil/uL (ref 3.87–5.11)
RDW: 14.4 % (ref 11.5–15.5)
WBC: 8.3 10*3/uL (ref 4.0–10.5)

## 2021-02-08 LAB — HEMOGLOBIN A1C: Hgb A1c MFr Bld: 6.1 % (ref 4.6–6.5)

## 2021-02-08 LAB — LIPID PANEL
Cholesterol: 217 mg/dL — ABNORMAL HIGH (ref 0–200)
HDL: 70.4 mg/dL (ref >39.00)
LDL Cholesterol: 127 mg/dL — ABNORMAL HIGH (ref 0–99)
NonHDL: 146.26
Total CHOL/HDL Ratio: 3
Triglycerides: 97 mg/dL (ref 0.0–149.0)
VLDL: 19.4 mg/dL (ref 0.0–40.0)

## 2021-02-08 MED ORDER — CYCLOBENZAPRINE HCL 5 MG PO TABS: 5.0000 mg | ORAL_TABLET | Freq: Three times a day (TID) | ORAL | 1 refills | Status: DC | PRN

## 2021-02-08 NOTE — Patient Instructions (Signed)
We have sent in the muscle relaxer flexeril to use 1/2 pill in the evening for the muscle pain. It is also okay to take 2 tylenol to see if this helps more for pain.
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
## 2021-02-09 DIAGNOSIS — M25511 Pain in right shoulder: Secondary | ICD-10-CM | POA: Insufficient documentation

## 2021-02-09 NOTE — Assessment & Plan Note (Signed)
Checking labs as due soon since she is here today. BP at goal on her losartan/hctz.
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
## 2021-02-12 DIAGNOSIS — G4733 Obstructive sleep apnea (adult) (pediatric): Secondary | ICD-10-CM | POA: Diagnosis not present

## 2021-04-13 ENCOUNTER — Other Ambulatory Visit: Payer: Self-pay

## 2021-04-13 ENCOUNTER — Ambulatory Visit (INDEPENDENT_AMBULATORY_CARE_PROVIDER_SITE_OTHER): Payer: Medicare HMO | Admitting: Internal Medicine

## 2021-04-13 ENCOUNTER — Encounter: Payer: Self-pay | Admitting: Internal Medicine

## 2021-04-13 VITALS — BP 132/68 | HR 93 | Temp 98.4°F | Resp 18 | Ht 60.5 in | Wt 206.6 lb

## 2021-04-13 DIAGNOSIS — E785 Hyperlipidemia, unspecified: Secondary | ICD-10-CM

## 2021-04-13 DIAGNOSIS — E210 Primary hyperparathyroidism: Secondary | ICD-10-CM | POA: Diagnosis not present

## 2021-04-13 DIAGNOSIS — Z0000 Encounter for general adult medical examination without abnormal findings: Secondary | ICD-10-CM | POA: Diagnosis not present

## 2021-04-13 DIAGNOSIS — I10 Essential (primary) hypertension: Secondary | ICD-10-CM | POA: Diagnosis not present

## 2021-04-13 NOTE — Assessment & Plan Note (Signed)
Recent labs normal on 20 mg daily simvastatin. Continue.

## 2021-04-13 NOTE — Assessment & Plan Note (Signed)
Flu shot counseled. Covid-19 3 shots up to date. Pneumonia counseled declines. Shingrix counseled to get at pharmacy. Tetanus due declines. Colonoscopy due 2023. Mammogram due 2023, pap smear aged out and dexa due 2023. Counseled about sun safety and mole surveillance. Counseled about the dangers of distracted driving. Given 10 year screening recommendations.

## 2021-04-13 NOTE — Patient Instructions (Signed)
Health Maintenance, Female Adopting a healthy lifestyle and getting preventive care are important in promoting health and wellness. Ask your health care provider about:  The right schedule for you to have regular tests and exams.  Things you can do on your own to prevent diseases and keep yourself healthy. What should I know about diet, weight, and exercise? Eat a healthy diet  Eat a diet that includes plenty of vegetables, fruits, low-fat dairy products, and lean protein.  Do not eat a lot of foods that are high in solid fats, added sugars, or sodium.   Maintain a healthy weight Body mass index (BMI) is used to identify weight problems. It estimates body fat based on height and weight. Your health care provider can help determine your BMI and help you achieve or maintain a healthy weight. Get regular exercise Get regular exercise. This is one of the most important things you can do for your health. Most adults should:  Exercise for at least 150 minutes each week. The exercise should increase your heart rate and make you sweat (moderate-intensity exercise).  Do strengthening exercises at least twice a week. This is in addition to the moderate-intensity exercise.  Spend less time sitting. Even light physical activity can be beneficial. Watch cholesterol and blood lipids Have your blood tested for lipids and cholesterol at 69 years of age, then have this test every 5 years. Have your cholesterol levels checked more often if:  Your lipid or cholesterol levels are high.  You are older than 69 years of age.  You are at high risk for heart disease. What should I know about cancer screening? Depending on your health history and family history, you may need to have cancer screening at various ages. This may include screening for:  Breast cancer.  Cervical cancer.  Colorectal cancer.  Skin cancer.  Lung cancer. What should I know about heart disease, diabetes, and high blood  pressure? Blood pressure and heart disease  High blood pressure causes heart disease and increases the risk of stroke. This is more likely to develop in people who have high blood pressure readings, are of African descent, or are overweight.  Have your blood pressure checked: ? Every 3-5 years if you are 18-39 years of age. ? Every year if you are 40 years old or older. Diabetes Have regular diabetes screenings. This checks your fasting blood sugar level. Have the screening done:  Once every three years after age 40 if you are at a normal weight and have a low risk for diabetes.  More often and at a younger age if you are overweight or have a high risk for diabetes. What should I know about preventing infection? Hepatitis B If you have a higher risk for hepatitis B, you should be screened for this virus. Talk with your health care provider to find out if you are at risk for hepatitis B infection. Hepatitis C Testing is recommended for:  Everyone born from 1945 through 1965.  Anyone with known risk factors for hepatitis C. Sexually transmitted infections (STIs)  Get screened for STIs, including gonorrhea and chlamydia, if: ? You are sexually active and are younger than 69 years of age. ? You are older than 69 years of age and your health care provider tells you that you are at risk for this type of infection. ? Your sexual activity has changed since you were last screened, and you are at increased risk for chlamydia or gonorrhea. Ask your health care provider   if you are at risk.  Ask your health care provider about whether you are at high risk for HIV. Your health care provider may recommend a prescription medicine to help prevent HIV infection. If you choose to take medicine to prevent HIV, you should first get tested for HIV. You should then be tested every 3 months for as long as you are taking the medicine. Pregnancy  If you are about to stop having your period (premenopausal) and  you may become pregnant, seek counseling before you get pregnant.  Take 400 to 800 micrograms (mcg) of folic acid every day if you become pregnant.  Ask for birth control (contraception) if you want to prevent pregnancy. Osteoporosis and menopause Osteoporosis is a disease in which the bones lose minerals and strength with aging. This can result in bone fractures. If you are 65 years old or older, or if you are at risk for osteoporosis and fractures, ask your health care provider if you should:  Be screened for bone loss.  Take a calcium or vitamin D supplement to lower your risk of fractures.  Be given hormone replacement therapy (HRT) to treat symptoms of menopause. Follow these instructions at home: Lifestyle  Do not use any products that contain nicotine or tobacco, such as cigarettes, e-cigarettes, and chewing tobacco. If you need help quitting, ask your health care provider.  Do not use street drugs.  Do not share needles.  Ask your health care provider for help if you need support or information about quitting drugs. Alcohol use  Do not drink alcohol if: ? Your health care provider tells you not to drink. ? You are pregnant, may be pregnant, or are planning to become pregnant.  If you drink alcohol: ? Limit how much you use to 0-1 drink a day. ? Limit intake if you are breastfeeding.  Be aware of how much alcohol is in your drink. In the [place] one drink equals one 12 oz bottle of beer (355 mL), one 5 oz glass of wine (148 mL), or one 1 oz glass of hard liquor (44 mL). General instructions  Schedule regular health, dental, and eye exams.  Stay current with your vaccines.  Tell your health care provider if: ? You often feel depressed. ? You have ever been abused or do not feel safe at home. Summary  Adopting a healthy lifestyle and getting preventive care are important in promoting health and wellness.  Follow your health care provider's instructions about healthy  diet, exercising, and getting tested or screened for diseases.  Follow your health care provider's instructions on monitoring your cholesterol and blood pressure. This information is not intended to replace advice given to you by your health care provider. Make sure you discuss any questions you have with your health care provider. Document Revised: 12/05/2018 Document Reviewed: 12/05/2018 Elsevier Patient Education  2021 [employer]  

## 2021-04-13 NOTE — Assessment & Plan Note (Signed)
Overall stable, due for DEXA 2023. Discussed with her today.

## 2021-04-13 NOTE — Progress Notes (Signed)
Subjective:   Patient ID: Sarah Santos, female    DOB: 1951/12/02, 70 y.o.   MRN: [id number]  HPI Here for medicare wellness and physical, no new complaints. Please see A/P for status and treatment of chronic medical problems.   Diet: heart healthy Physical activity: sedentary 2-3 times a week Depression/mood screen: negative Hearing: intact to whispered voice Visual acuity: grossly normal, performs annual eye exam  ADLs: capable Fall risk: none Home safety: good Cognitive evaluation: intact to orientation, naming, recall and repetition EOL planning: adv directives discussed, in place  [employer] Visit from 04/13/2021 in [place] at [facility] Total Score 0      I have personally reviewed and have noted 1. The patient's medical and social history - reviewed today no changes 2. Their use of alcohol, tobacco or illicit drugs 3. Their current medications and supplements 4. The patient's functional ability including ADL's, fall risks, home safety risks and hearing or visual impairment. 5. Diet and physical activities 6. Evidence for depression or mood disorders 7. Care team reviewed and updated 8.  The patient is not on an opioid pain medication.  Patient Care Team: Hoyt Koch, MD as PCP - General (Internal Medicine) Bettina Gavia Hilton Cork, MD as PCP - Cardiology (Cardiology) Gatha Mayer, MD (Gastroenterology) Marylynn Pearson, MD (Obstetrics and Gynecology) Bjorn Loser, MD (Urology) Past Medical History:  Diagnosis Date  . ALLERGIC RHINITIS   . Anemia   . Atypical chest pain 07/17/2019  . 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 Chronic pain of left knee 07/17/2019  . Degenerative arthritis of left knee 08/02/2019  . Dyslipidemia 05/08/2009    On Simvastatin   . Dyspnea on exertion 07/17/2019  . 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 GERD   . HEADACHE, CHRONIC   . Heart murmur   . Hematuria,  microscopic   . History of anemia   . Hx of adenomatous colonic polyps   . Hypercalcemia   . HYPERLIPIDEMIA   . Hypertension   . 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 Menopausal symptom 05/26/2018  . Microscopic hematuria 07/02/2009   Qualifier: Diagnosis of  By: Asa Lente MD, Valerie A   . OSA on CPAP   . Personal history of colonic polyps 01/04/2008  . Primary hyperparathyroidism ([place])   . Routine general medical examination at a health care facility 06/13/2015   Past Surgical History:  Procedure Laterality Date  . carpal tunnel Bilateral   . COLONOSCOPY W/ POLYPECTOMY  2009, 08/18/11   2009:adenomas, largest 4 cm, rectal carcinoid, hemorrhoids  . OVARIAN CYST REMOVAL  70's  . TUBAL LIGATION     Family History  Problem Relation Age of Onset  . Heart disease Mother 68       PCI  . Pulmonary embolism Mother   . Cancer Other        Lung  . Heart disease Other        Coronary Atery Disease  . Stomach cancer Paternal Aunt   . Stroke Father   . Diabetes Brother   . Hyperlipidemia Brother   . Heart disease Brother   . Colon cancer Neg Hx   . Colon polyps Neg Hx   . Other Neg Hx        hyperparathyroidism  . Esophageal cancer Neg Hx   . Rectal cancer Neg Hx    Review of Systems  Constitutional: Negative.   HENT: Negative.  Eyes: Negative.   Respiratory: Negative for cough, chest tightness and shortness of breath.   Cardiovascular: Negative for chest pain, palpitations and leg swelling.  Gastrointestinal: Negative for abdominal distention, abdominal pain, constipation, diarrhea, nausea and vomiting.  Musculoskeletal: Positive for arthralgias and myalgias.  Skin: Negative.   Neurological: Negative.   Psychiatric/Behavioral: Negative.     Objective:  Physical Exam Constitutional:      Appearance: She is well-developed. She is obese.  HENT:     Head: Normocephalic and atraumatic.  Cardiovascular:     Rate and Rhythm: Normal rate and regular rhythm.  Pulmonary:     Effort:  Pulmonary effort is normal. No respiratory distress.     Breath sounds: Normal breath sounds. No wheezing or rales.  Abdominal:     General: Bowel sounds are normal. There is no distension.     Palpations: Abdomen is soft.     Tenderness: There is no abdominal tenderness. There is no rebound.  Musculoskeletal:     Cervical back: Normal range of motion.  Skin:    General: Skin is warm and dry.     Comments: Skin tags neck   Neurological:     Mental Status: She is alert and oriented to person, place, and time.     Coordination: Coordination normal.     Vitals:   04/13/21 1249  BP: 132/68  Pulse: 93  Resp: 18  Temp: 98.4 F (36.9 C)  TempSrc: Oral  SpO2: 100%  Weight: 206 lb 9.6 oz (93.7 kg)  Height: 5' 0.5" (1.537 m)   This visit occurred during the SARS-CoV-2 public health emergency.  Safety protocols were in place, including screening questions prior to the visit, additional usage of staff PPE, and extensive cleaning of exam room while observing appropriate contact time as indicated for disinfecting solutions.   Assessment & Plan:

## 2021-04-13 NOTE — Assessment & Plan Note (Signed)
BP at goal on losartan and recent CMP normal.

## 2021-09-30 ENCOUNTER — Ambulatory Visit: Payer: Medicare HMO | Admitting: Gastroenterology

## 2021-09-30 DIAGNOSIS — G4733 Obstructive sleep apnea (adult) (pediatric): Secondary | ICD-10-CM | POA: Diagnosis not present

## 2021-10-04 ENCOUNTER — Encounter: Payer: Self-pay | Admitting: Internal Medicine

## 2021-10-04 ENCOUNTER — Other Ambulatory Visit: Payer: Self-pay

## 2021-10-04 ENCOUNTER — Ambulatory Visit (INDEPENDENT_AMBULATORY_CARE_PROVIDER_SITE_OTHER): Payer: Medicare HMO | Admitting: Internal Medicine

## 2021-10-04 VITALS — BP 124/80 | HR 72 | Resp 18 | Ht 60.5 in | Wt 208.0 lb

## 2021-10-04 DIAGNOSIS — R0609 Other forms of dyspnea: Secondary | ICD-10-CM

## 2021-10-04 DIAGNOSIS — K921 Melena: Secondary | ICD-10-CM | POA: Diagnosis not present

## 2021-10-04 DIAGNOSIS — I10 Essential (primary) hypertension: Secondary | ICD-10-CM | POA: Diagnosis not present

## 2021-10-04 DIAGNOSIS — R1013 Epigastric pain: Secondary | ICD-10-CM

## 2021-10-04 DIAGNOSIS — Z23 Encounter for immunization: Secondary | ICD-10-CM

## 2021-10-04 LAB — FERRITIN: Ferritin: 16.9 ng/mL (ref 10.0–291.0)

## 2021-10-04 LAB — CBC
HCT: 34.5 % — ABNORMAL LOW (ref 36.0–46.0)
Hemoglobin: 11.1 g/dL — ABNORMAL LOW (ref 12.0–15.0)
MCHC: 32.2 g/dL (ref 30.0–36.0)
MCV: 80.6 fl (ref 78.0–100.0)
Platelets: 285 10*3/uL (ref 150.0–400.0)
RBC: 4.28 Mil/uL (ref 3.87–5.11)
RDW: 14.3 % (ref 11.5–15.5)
WBC: 5.9 10*3/uL (ref 4.0–10.5)

## 2021-10-04 NOTE — Assessment & Plan Note (Signed)
New problem and EKG done to assess which is unchanged from prior 2021. She does have known problem of GERD and is taking protonix 40 mg daily regularly without missing making ulcer less likely. She could still have breakthrough GERD symptoms and advised to consider dietary changes. Checking CBC to rule out bleeding upper source causing concurrent blood in stool.

## 2021-10-04 NOTE — Assessment & Plan Note (Signed)
BP is at goal and will continue losartan/hctz and losartan for total dosing 100/12.5 mg daily.

## 2021-10-04 NOTE — Patient Instructions (Signed)
The EKG looks good today.  We will check the labs to monitor the blood counts.

## 2021-10-04 NOTE — Progress Notes (Signed)
   Subjective:   Patient ID: Sarah Santos, female    DOB: [birth year], 69 y.o.   MRN: [id number]  HPI The patient is a 69 YO female coming in for new epigastric pain and new blood in stool.   Review of Systems  Constitutional: Negative.   HENT: Negative.    Eyes: Negative.   Respiratory:  Negative for cough, chest tightness and shortness of breath.   Cardiovascular:  Negative for chest pain, palpitations and leg swelling.  Gastrointestinal:  Positive for abdominal pain and blood in stool. Negative for abdominal distention, anal bleeding, constipation, diarrhea, nausea, rectal pain and vomiting.  Musculoskeletal: Negative.   Skin: Negative.   Neurological: Negative.   Psychiatric/Behavioral: Negative.     Objective:  Physical Exam Constitutional:      Appearance: She is well-developed.  HENT:     Head: Normocephalic and atraumatic.  Cardiovascular:     Rate and Rhythm: Normal rate and regular rhythm.  Pulmonary:     Effort: Pulmonary effort is normal. No respiratory distress.     Breath sounds: Normal breath sounds. No wheezing or rales.  Abdominal:     General: Bowel sounds are normal. There is no distension.     Palpations: Abdomen is soft. There is no mass.     Tenderness: There is no abdominal tenderness. There is no guarding or rebound.     Hernia: No hernia is present.  Musculoskeletal:     Cervical back: Normal range of motion.  Skin:    General: Skin is warm and dry.  Neurological:     Mental Status: She is alert and oriented to person, place, and time.     Coordination: Coordination normal.    Vitals:   10/04/21 0842  BP: 124/80  Pulse: 72  Resp: 18  SpO2: 97%  Weight: 208 lb (94.3 kg)  Height: 5' 0.5" (1.537 m)   EKG: Rate 64, axis left, interval normal, sinus, no st or t wave changes, no significant change compared to prior 2021  This visit occurred during the SARS-CoV-2 public health emergency.  Safety protocols were in place, including  screening questions prior to the visit, additional usage of staff PPE, and extensive cleaning of exam room while observing appropriate contact time as indicated for disinfecting solutions.   Assessment & Plan:  Flu shot given at visit

## 2021-10-04 NOTE — Assessment & Plan Note (Signed)
Overall is stable.

## 2021-10-04 NOTE — Assessment & Plan Note (Signed)
New problem present for 1-2 weeks with intermittent episodes of drops of blood in the stool itself not coating the stool. There have been intermittent episodes of blood on toilet tissue. Last colonoscopy 2018 and report reviewed and reviewed with patient during visit with internal hemorrhoids and polyp removal. Due for repeat May 2023. She had visit with GI last week which was cancelled due to staff not being present and was rescheduled for 10/26/21. Checking CBC and ferritin to assess for amount of blood lost and if significant will contact her GI provider to make that apt more urgent.

## 2021-10-08 ENCOUNTER — Other Ambulatory Visit: Payer: Self-pay | Admitting: Internal Medicine

## 2021-10-26 ENCOUNTER — Ambulatory Visit (INDEPENDENT_AMBULATORY_CARE_PROVIDER_SITE_OTHER): Payer: Medicare HMO | Admitting: Physician Assistant

## 2021-10-26 ENCOUNTER — Encounter: Payer: Self-pay | Admitting: Physician Assistant

## 2021-10-26 ENCOUNTER — Other Ambulatory Visit (INDEPENDENT_AMBULATORY_CARE_PROVIDER_SITE_OTHER): Payer: Medicare HMO

## 2021-10-26 VITALS — BP 112/60 | HR 96 | Ht 60.25 in | Wt 208.5 lb

## 2021-10-26 DIAGNOSIS — R140 Abdominal distension (gaseous): Secondary | ICD-10-CM

## 2021-10-26 DIAGNOSIS — K921 Melena: Secondary | ICD-10-CM

## 2021-10-26 DIAGNOSIS — R198 Other specified symptoms and signs involving the digestive system and abdomen: Secondary | ICD-10-CM | POA: Diagnosis not present

## 2021-10-26 DIAGNOSIS — K219 Gastro-esophageal reflux disease without esophagitis: Secondary | ICD-10-CM

## 2021-10-26 DIAGNOSIS — R1010 Upper abdominal pain, unspecified: Secondary | ICD-10-CM

## 2021-10-26 LAB — COMPREHENSIVE METABOLIC PANEL
ALT: 16 U/L (ref 0–35)
AST: 16 U/L (ref 0–37)
Albumin: 4.5 g/dL (ref 3.5–5.2)
Alkaline Phosphatase: 99 U/L (ref 39–117)
BUN: 21 mg/dL (ref 6–23)
CO2: 29 mEq/L (ref 19–32)
Calcium: 10.7 mg/dL — ABNORMAL HIGH (ref 8.4–10.5)
Chloride: 102 mEq/L (ref 96–112)
Creatinine, Ser: 1.19 mg/dL (ref 0.40–1.20)
GFR: 46.58 mL/min — ABNORMAL LOW (ref >60.00)
Glucose, Bld: 88 mg/dL (ref 70–99)
Potassium: 3.6 mEq/L (ref 3.5–5.1)
Sodium: 138 mEq/L (ref 135–145)
Total Bilirubin: 0.3 mg/dL (ref 0.2–1.2)
Total Protein: 8.1 g/dL (ref 6.0–8.3)

## 2021-10-26 LAB — CBC WITH DIFFERENTIAL/PLATELET
Basophils Absolute: 0.1 10*3/uL (ref 0.0–0.1)
Basophils Relative: 0.8 % (ref 0.0–3.0)
Eosinophils Absolute: 0.2 10*3/uL (ref 0.0–0.7)
Eosinophils Relative: 1.9 % (ref 0.0–5.0)
HCT: 34.9 % — ABNORMAL LOW (ref 36.0–46.0)
Hemoglobin: 11.2 g/dL — ABNORMAL LOW (ref 12.0–15.0)
Lymphocytes Relative: 39.4 % (ref 12.0–46.0)
Lymphs Abs: 3.3 10*3/uL (ref 0.7–4.0)
MCHC: 32.1 g/dL (ref 30.0–36.0)
MCV: 80.7 fl (ref 78.0–100.0)
Monocytes Absolute: 0.8 10*3/uL (ref 0.1–1.0)
Monocytes Relative: 9.4 % (ref 3.0–12.0)
Neutro Abs: 4.1 10*3/uL (ref 1.4–7.7)
Neutrophils Relative %: 48.5 % (ref 43.0–77.0)
Platelets: 275 10*3/uL (ref 150.0–400.0)
RBC: 4.33 Mil/uL (ref 3.87–5.11)
RDW: 14.1 % (ref 11.5–15.5)
WBC: 8.4 10*3/uL (ref 4.0–10.5)

## 2021-10-26 LAB — C-REACTIVE PROTEIN: CRP: <1 mg/dL (ref 0.5–20.0)

## 2021-10-26 LAB — SEDIMENTATION RATE: Sed Rate: 57 mm/hr — ABNORMAL HIGH (ref 0–30)

## 2021-10-26 NOTE — Patient Instructions (Signed)
If you are age 69 or older, your body mass index should be between 23-30. Your Body mass index is 40.38 kg/m. If this is out of the aforementioned range listed, please consider follow up with your Primary Care Provider. ________________________________________________________  The [facility] providers would like to encourage you to use [facility] to communicate with providers for non-urgent requests or questions.  Due to long hold times on the telephone, sending your provider a message by [facility] may be a faster and more efficient way to get a response.  Please allow 48 business hours for a response.  Please remember that this is for non-urgent requests.  _______________________________________________________  Your provider has requested that you go to the basement level for lab work before leaving today. Press "B" on the elevator. The lab is located at the first door on the left as you exit the elevator.  You have been scheduled for an endoscopy and colonoscopy. Please follow the written instructions given to you at your visit today. Please pick up your prep supplies at the pharmacy within the next 1-3 days. If you use inhalers (even only as needed), please bring them with you on the day of your procedure.  Call the office if your rectal bleeding worsens.  Follow up pending the results of your Colonoscopy/Endoscopy  Thank you for entrusting me with your care and choosing [facility].  Amy Esterwood, PA-C

## 2021-10-26 NOTE — Progress Notes (Signed)
Subjective:    Patient ID: Sarah Santos, female    DOB: [birth year], 70 y.o.   MRN: [id number]  HPI Sarah Santos is a pleasant 70 year old African-American female, established with Dr. Carlean Purl who comes in today with complaints of blood mixed in with her bowel movements over the past month.  She says she has also been having some vague abdominal discomfort over the past several months.  Recently she had been awakened in the middle of the night with an epigastric pain which she then determined was acid reflux.  She has been having some fullness and discomfort after eating frequently.  She feels as if her abdominal discomfort is somewhat migratory at times.  A month or so ago she had noticed blood on the tissue with wiping and then started paying attention to her bowel movements and has been noticing on a regular basis that she is seeing spots of blood or red blood [place] mixed in with the bowel movements, she is no longer seeing any blood on the tissue.  Stools have changed somewhat and are softer.  No dysphagia or odynophagia.  She is maintained on Protonix 40 mg p.o. every morning which she has been taking regularly. She had labs done early October with hemoglobin 11.1/hematocrit 34.5/MCV of 80.  She does have history of chronic mild normocytic anemia  Last colonoscopy was done in May 2018 for follow-up of adenomatous polyps.  She has history of a 4 cm adenoma removed in 2009 and also had a prior rectal carcinoid removed which was less than 1 cm.  At colonoscopy 2018 she had 1 5 mm polyp in the transverse colon which was a tubular adenoma and was indicated for 5-year interval follow-up.  No prior EGD. Other medical issues include hypertension, sleep apnea, obesity, and hyperparathyroidism.  Review of Systems Pertinent positive and negative review of systems were noted in the above HPI section.  All other review of systems was otherwise negative.   Outpatient Encounter Medications as of  10/26/2021  Medication Sig   acetaminophen (TYLENOL) 500 MG tablet Take 500 mg by mouth every 6 (six) hours as needed.   cyclobenzaprine (FLEXERIL) 5 MG tablet Take 1 tablet (5 mg total) by mouth 3 (three) times daily as needed for muscle spasms.   Glucosamine HCl (GLUCOSAMINE PO) Take by mouth.   loratadine (CLARITIN) 10 MG tablet Take 10 mg by mouth daily as needed for allergies.   losartan (COZAAR) 50 MG tablet TAKE 1 TABLET EVERY DAY   losartan-hydrochlorothiazide (HYZAAR) 50-12.5 MG tablet TAKE 1 TABLET EVERY DAY   pantoprazole (PROTONIX) 40 MG tablet TAKE 1 TABLET EVERY DAY   simvastatin (ZOCOR) 20 MG tablet TAKE 1 TABLET BY MOUTH DAILY AT 6 PM. (Patient taking differently: Take 20 mg by mouth 2 (two) times a week.)   Turmeric (QC TUMERIC COMPLEX PO) Take by mouth.   No facility-administered encounter medications on file as of 10/26/2021.   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   Hypertension 04/04/2012   Primary hyperparathyroidism ([place]) 01/25/2011   GERD 01/19/2011  MICROSCOPIC HEMATURIA 07/02/2009   Dyslipidemia 05/08/2009   ALLERGIC RHINITIS 05/08/2009   Personal history of colonic polyps 01/04/2008   Carcinoid tumor of colon 2009   Social History   Socioeconomic History   Marital status: Divorced    Spouse name: Not on file   Number of children: 2   Years of education: Not on file   Highest education level: Not on file  Occupational History   Occupation: retired  Tobacco Use   Smoking status: Former    Types: Cigarettes    Quit date: 1980    Years  since quitting: 42.8   Smokeless tobacco: Never  Vaping Use   Vaping Use: Never used  Substance and Sexual Activity   Alcohol use: No   Drug use: No   Sexual activity: Not Currently    Birth control/protection: Post-menopausal, Surgical  Other Topics Concern   Not on file  Social History Narrative   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   Social Determinants of Health   Financial Resource Strain: Not on file  Food Insecurity: Not on file  Transportation Needs: Not on file  Physical Activity: Not on file  Stress: Not on file  Social Connections: Not on file  Intimate Partner Violence: Not on file    Sarah Santos's family history includes Cancer in her paternal aunt; Colon polyps in her brother and sister; Diabetes in her father and paternal grandmother; Heart disease in her brother and father; Heart failure in her father; Hyperlipidemia in her brother and daughter; Hypertension in her mother; Other in her brother and daughter; Pulmonary embolism in her mother; Stomach cancer in her paternal aunt; Stroke in her father.      Objective:    Vitals:   10/26/21 1422  BP: 112/60  Pulse: 96    Physical Exam Well-developed well-nourished older African-American female in no acute distress.  Height, Weight, 208 BMI 40.3  HEENT; nontraumatic normocephalic, EOMI, PE R LA, sclera anicteric. Oropharynx; not examined today Neck; supple, no JVD Cardiovascular; regular rate and rhythm with S1-S2, no murmur rub or gallop Pulmonary; Clear bilaterally Abdomen; soft, obese nondistended, there is some mild tenderness in the left upper quadrant left mid quadrant, and epigastrium, no guarding or rebound, no palpable mass or hepatosplenomegaly, bowel sounds are active Rectal; not done today Skin; benign exam, no jaundice rash or appreciable lesions Extremities; no clubbing cyanosis or edema skin warm and dry Neuro/Psych; alert and oriented x4, grossly nonfocal mood and affect  appropriate        Assessment & Plan:   #84 70 year old African-Americanican female presenting with bright red blood mixed in with her bowel movements over the past month, mild change in bowel habits with somewhat softer stools, fullness and upper abdominal discomfort postprandially as well as some intermittent more migratory abdominal discomfort. Patient has chronic GERD and is maintained on PPI.  Etiology of the hematochezia is not clear, does not sound consistent with hemorrhoidal bleeding, rule out occult colon lesion.  Patient with prior history of adenomatous colon polyps, last colonoscopy May 2018 with 1 5 mm tubular adenoma.  In 2009 she had a large adenomatous polyp removed as well as removal of a less than 1 cm rectal carcinoid.  With upper abdominal fullness/discomfort will also need to rule out gastropathy, peptic ulcer disease, component of gastroparesis, other intra-abdominal inflammatory process  #2 hypertension #3.  Sleep apnea -no oxygen use #4.  Primary hyperparathyroid #5 chronic mild normocytic anemia  Plan; continue Protonix 40 mg p.o.  every morning AC breakfast CBC with differential, c-Met, sed rate Patient will be scheduled for colonoscopy and EGD with Dr. Carlean Purl.  Both procedures were discussed in detail with the patient including indications risk and benefits and she is agreeable to proceed.  No procedure availability for both of these procedures to be done on the same day until mid December.  Patient prefers Dr. Carlean Purl to perform the procedures and also wishes to have both done on the same day. I have asked her to call if she has any worsening of above symptoms over the next couple of weeks, if so will obtain cross-sectional imaging prior to planned endoscopic evaluation.  Lycia Sachdeva Genia Harold PA-C 10/26/2021   Cc: Hoyt Koch, *

## 2021-10-27 ENCOUNTER — Other Ambulatory Visit: Payer: Self-pay | Admitting: Internal Medicine

## 2021-10-27 NOTE — Telephone Encounter (Signed)
Ok to refill? Hasn't been refilled since Sept 2020

## 2021-10-27 NOTE — Telephone Encounter (Signed)
1.Medication Requested: simvastatin (ZOCOR) 20 MG tablet  2. Pharmacy (Name, [facility]): [place], [place]  3. On Med List: yes  4. Last Visit with PCP: 10-04-2021  5. Next visit date with PCP: n/a   Agent: Please be advised that RX refills may take up to 3 business days. We ask that you follow-up with your pharmacy.    Patient states she only has 3 doses of medication left

## 2021-10-28 NOTE — Telephone Encounter (Signed)
Can you verify how she is taking this? Last sig reported by her is twice a day but rx states daily that way we can ensure script is accurate.

## 2021-10-29 MED ORDER — SIMVASTATIN 20 MG PO TABS: 20.0000 mg | ORAL_TABLET | ORAL | 3 refills | Status: DC

## 2021-10-29 NOTE — Telephone Encounter (Signed)
Patient states she takes 1 tablet twice a week

## 2021-11-03 ENCOUNTER — Ambulatory Visit (INDEPENDENT_AMBULATORY_CARE_PROVIDER_SITE_OTHER): Payer: Medicare HMO | Admitting: Obstetrics & Gynecology

## 2021-11-03 ENCOUNTER — Encounter: Payer: Self-pay | Admitting: Obstetrics & Gynecology

## 2021-11-03 ENCOUNTER — Other Ambulatory Visit: Payer: Self-pay

## 2021-11-03 VITALS — BP 108/62 | HR 92 | Resp 16 | Ht 60.5 in | Wt 208.0 lb

## 2021-11-03 DIAGNOSIS — Z780 Asymptomatic menopausal state: Secondary | ICD-10-CM | POA: Diagnosis not present

## 2021-11-03 DIAGNOSIS — L292 Pruritus vulvae: Secondary | ICD-10-CM

## 2021-11-03 DIAGNOSIS — Z01411 Encounter for gynecological examination (general) (routine) with abnormal findings: Secondary | ICD-10-CM

## 2021-11-03 DIAGNOSIS — M8589 Other specified disorders of bone density and structure, multiple sites: Secondary | ICD-10-CM

## 2021-11-03 DIAGNOSIS — Z9189 Other specified personal risk factors, not elsewhere classified: Secondary | ICD-10-CM | POA: Diagnosis not present

## 2021-11-03 DIAGNOSIS — Z6839 Body mass index (BMI) 39.0-39.9, adult: Secondary | ICD-10-CM

## 2021-11-03 MED ORDER — NYSTATIN-TRIAMCINOLONE 100000-0.1 UNIT/GM-% EX CREA: 1.0000 "application " | TOPICAL_CREAM | Freq: Every day | CUTANEOUS | 4 refills | Status: AC | PRN

## 2021-11-03 NOTE — Progress Notes (Addendum)
Sarah Santos July 04, 1951 [id number]   History:    70 y.o. G3P2A1L2    RP:  Established patient presenting for annual gyn exam    HPI: Postmenopause, well on no hormone replacement therapy.  No postmenopausal bleeding.  No pelvic pain.  Abstinent.  Pap Neg 08/2020. Urine normal.  Breasts normal.  Will schedule her screening mammo now.  Body mass index 39.95.  Back to the gym twice a week.  Good nutrition, will go on a low calorie/carb diet.  Health labs with family physician.  Very mild Osteopenia T-Score -1.3 in 2018, will schedule BD through her Fam MD.  Colono/Endoscopy planned.   Past medical history,surgical history, family history and social history were all reviewed and documented in the EPIC chart.  Gynecologic History No LMP recorded. Patient is postmenopausal.  Obstetric History OB History  Gravida Para Term Preterm AB Living  3 2     1 2   SAB IAB Ectopic Multiple Live Births               # Outcome Date GA Lbr Len/2nd Weight Sex Delivery Anes PTL Lv  3 AB           2 Para           1 Para              ROS: A ROS was performed and pertinent positives and negatives are included in the history.  GENERAL: No fevers or chills. HEENT: No change in vision, no earache, sore throat or sinus congestion. NECK: No pain or stiffness. CARDIOVASCULAR: No chest pain or pressure. No palpitations. PULMONARY: No shortness of breath, cough or wheeze. GASTROINTESTINAL: No abdominal pain, nausea, vomiting or diarrhea, melena or bright red blood per rectum. GENITOURINARY: No urinary frequency, urgency, hesitancy or dysuria. MUSCULOSKELETAL: No joint or muscle pain, no back pain, no recent trauma. DERMATOLOGIC: No rash, no itching, no lesions. ENDOCRINE: No polyuria, polydipsia, no heat or cold intolerance. No recent change in weight. HEMATOLOGICAL: No anemia or easy bruising or bleeding. NEUROLOGIC: No headache, seizures, numbness, tingling or weakness. PSYCHIATRIC: No depression, no  loss of interest in normal activity or change in sleep pattern.     Exam:   BP 108/62   Pulse 92   Resp 16   Ht 5' 0.5" (1.537 m)   Wt 208 lb (94.3 kg)   BMI 39.95 kg/m   Body mass index is 39.95 kg/m.  General appearance : Well developed well nourished female. No acute distress HEENT: Eyes: no retinal hemorrhage or exudates,  Neck supple, trachea midline, no carotid bruits, no thyroidmegaly Lungs: Clear to auscultation, no rhonchi or wheezes, or rib retractions  Heart: Regular rate and rhythm, no murmurs or gallops Breast:Examined in sitting and supine position were symmetrical in appearance, no palpable masses or tenderness,  no skin retraction, no nipple inversion, no nipple discharge, no skin discoloration, no axillary or supraclavicular lymphadenopathy Abdomen: no palpable masses or tenderness, no rebound or guarding Extremities: no edema or skin discoloration or tenderness  Pelvic: Vulva: Normal             Vagina: No gross lesions or discharge  Cervix: No gross lesions or discharge  Uterus  AV, normal size, shape and consistency, non-tender and mobile  Adnexa  Without masses or tenderness  Anus: Normal   Assessment/Plan:  70 y.o. female for annual exam   1. Wellness Gyn exam at high risk for complication of fracture Postmenopause, well on no hormone  replacement therapy.  No postmenopausal bleeding.  No pelvic pain.  Abstinent.  Pap Neg 08/2020. Urine normal.  Breasts normal.  Will schedule her screening mammo now.  Body mass index 39.95.  Back to the gym twice a week.  Good nutrition, will go on a low calorie/carb diet.  Health labs with family physician.  Very mild Osteopenia T-Score -1.3 in 2018, will schedule BD through her Fam MD.  Colono/Endoscopy planned.    2. Postmenopausal Well on no HRT.  No PMB.  3. Osteopenia of multiple sites Will schedule BD through her Fam MD.  4. Vulvar itching Probable yeast/inflammation.  Mycolog cream as needed.  Prescription  sent to pharmacy.  5. Class 2 severe obesity due to excess calories with serious comorbidity and body mass index (BMI) of 39.0 to 39.9 in adult [facility]) Continue on a low Calorie/Carb diet and increased fitness activities.  6. Other specified personal risk factors, not elsewhere classified  Other orders - Multiple Vitamin (MULTIVITAMIN PO); Take by mouth.   Princess Bruins MD, 1:48 PM 11/03/2021

## 2021-11-03 NOTE — Addendum Note (Signed)
Addended by: Princess Bruins on: 11/03/2021 02:24 PM   Modules accepted: Orders, Level of Service

## 2021-12-08 ENCOUNTER — Encounter: Payer: Self-pay | Admitting: Internal Medicine

## 2021-12-08 ENCOUNTER — Ambulatory Visit (AMBULATORY_SURGERY_CENTER): Payer: Medicare HMO | Admitting: Internal Medicine

## 2021-12-08 ENCOUNTER — Other Ambulatory Visit: Payer: Self-pay

## 2021-12-08 VITALS — BP 142/67 | HR 76 | Temp 96.8°F | Resp 17 | Ht 60.0 in | Wt 208.0 lb

## 2021-12-08 DIAGNOSIS — K921 Melena: Secondary | ICD-10-CM | POA: Diagnosis not present

## 2021-12-08 DIAGNOSIS — D132 Benign neoplasm of duodenum: Secondary | ICD-10-CM | POA: Diagnosis not present

## 2021-12-08 DIAGNOSIS — R1010 Upper abdominal pain, unspecified: Secondary | ICD-10-CM

## 2021-12-08 DIAGNOSIS — G4733 Obstructive sleep apnea (adult) (pediatric): Secondary | ICD-10-CM | POA: Diagnosis not present

## 2021-12-08 DIAGNOSIS — K219 Gastro-esophageal reflux disease without esophagitis: Secondary | ICD-10-CM | POA: Diagnosis not present

## 2021-12-08 DIAGNOSIS — D125 Benign neoplasm of sigmoid colon: Secondary | ICD-10-CM | POA: Diagnosis not present

## 2021-12-08 DIAGNOSIS — K6389 Other specified diseases of intestine: Secondary | ICD-10-CM

## 2021-12-08 DIAGNOSIS — I10 Essential (primary) hypertension: Secondary | ICD-10-CM | POA: Diagnosis not present

## 2021-12-08 DIAGNOSIS — K648 Other hemorrhoids: Secondary | ICD-10-CM

## 2021-12-08 DIAGNOSIS — K317 Polyp of stomach and duodenum: Secondary | ICD-10-CM

## 2021-12-08 MED ORDER — SODIUM CHLORIDE 0.9 % IV SOLN: 500.0000 mL | Freq: Once | INTRAVENOUS | Status: DC

## 2021-12-08 NOTE — Progress Notes (Signed)
Approx 1504 pt asystole.  Dr Carlean Purl aware and removed air and HR came back slowly with EKG changes.  Possibly junctional but could not capture on Strip.  Sedation halted.  Dr Darnell Level tried starting again and samething.  Asystole cured with air release and slow possiblr Junctional rhythm return.  Robinul was given.  Epi and atropine readied by path nurse but not needed.  EKG returned to normal both times.  BP once it cycled was never low.  Sedation never restatrted.  As pt woke up more she becames nauseated and zofran given and oropharynx suctioned.  PACU nurse made aware Pt vitals and EKG normal in PACU

## 2021-12-08 NOTE — Progress Notes (Signed)
Pt's states no medical or surgical changes since previsit or office visit. 

## 2021-12-08 NOTE — Patient Instructions (Addendum)
I found a small polyp in the upper intestine and removed it.  There was a small colon polyp near the rectum and I think that was likely cause of the bleeding you have had.  Your heart rate slowed down - a lot - briefly. That is called a vagal reaction and you also gagged a lot with the upper exam. I think any future procedures will be at hospital for extra safety.  I will let you know pathology results by mail and/or My Chart.   I appreciate the opportunity to care for you. Gatha Mayer, MD, [facility]  Please read handouts provided. Continue present medications. Await pathology results. No NSAIDS and aspirin for 2 weeks.    YOU HAD AN ENDOSCOPIC PROCEDURE TODAY AT [facility]:   Refer to the procedure report that was given to you for any specific questions about what was found during the examination.  If the procedure report does not answer your questions, please call your gastroenterologist to clarify.  If you requested that your care partner not be given the details of your procedure findings, then the procedure report has been included in a sealed envelope for you to review at your convenience later.  YOU SHOULD EXPECT: Some feelings of bloating in the abdomen. Passage of more gas than usual.  Walking can help get rid of the air that was put into your GI tract during the procedure and reduce the bloating. If you had a lower endoscopy (such as a colonoscopy or flexible sigmoidoscopy) you may notice spotting of blood in your stool or on the toilet paper. If you underwent a bowel prep for your procedure, you may not have a normal bowel movement for a few days.  Please Note:  You might notice some irritation and congestion in your nose or some drainage.  This is from the oxygen used during your procedure.  There is no need for concern and it should clear up in a day or so.  SYMPTOMS TO REPORT IMMEDIATELY:  Following lower endoscopy (colonoscopy or flexible  sigmoidoscopy):  Excessive amounts of blood in the stool  Significant tenderness or worsening of abdominal pains  Swelling of the abdomen that is new, acute  Fever of 100F or higher  Following upper endoscopy (EGD)  Vomiting of blood or coffee ground material  New chest pain or pain under the shoulder blades  Painful or persistently difficult swallowing  New shortness of breath  Fever of 100F or higher  Black, tarry-looking stools  For urgent or emergent issues, a gastroenterologist can be reached at any hour by calling [phone]. Do not use MyChart messaging for urgent concerns.    DIET:  We do recommend a small meal at first, but then you may proceed to your regular diet.  Drink plenty of fluids but you should avoid alcoholic beverages for 24 hours.  ACTIVITY:  You should plan to take it easy for the rest of today and you should NOT DRIVE or use heavy machinery until tomorrow (because of the sedation medicines used during the test).    FOLLOW UP: Our staff will call the number listed on your records 48-72 hours following your procedure to check on you and address any questions or concerns that you may have regarding the information given to you following your procedure. If we do not reach you, we will leave a message.  We will attempt to reach you two times.  During this call, we will ask if you have  developed any symptoms of COVID 19. If you develop any symptoms (ie: fever, flu-like symptoms, shortness of breath, cough etc.) before then, please call [phone].  If you test positive for Covid 19 in the 2 weeks post procedure, please call and report this information to [place].    If any biopsies were taken you will be contacted by phone or by letter within the next 1-3 weeks.  Please call us at [phone] if you have not heard about the biopsies in 3 weeks.    SIGNATURES/CONFIDENTIALITY: You and/or your care partner have signed paperwork which will be entered into your  electronic medical record.  These signatures attest to the fact that that the information above on your After Visit Summary has been reviewed and is understood.  Full responsibility of the confidentiality of this discharge information lies with you and/or your care-partner.

## 2021-12-08 NOTE — Op Note (Signed)
[place] Patient Name: Sarah Santos Procedure Date: 12/08/2021 2:21 PM MRN: [id number] Endoscopist: Gatha Mayer , MD Age: 70 Referring MD:  Date of Birth: Jun 26, 1951 Gender: Female Account #: [id number] Procedure:                Colonoscopy Indications:              Hematochezia Medicines:                Propofol per Anesthesia, Monitored Anesthesia Care Procedure:                Pre-Anesthesia Assessment:                           - Prior to the procedure, a History and Physical                            was performed, and patient medications and                            allergies were reviewed. The patient's tolerance of                            previous anesthesia was also reviewed. The risks                            and benefits of the procedure and the sedation                            options and risks were discussed with the patient.                            All questions were answered, and informed consent                            was obtained. Prior Anticoagulants: The patient has                            taken no previous anticoagulant or antiplatelet                            agents. ASA Grade Assessment: II - A patient with                            mild systemic disease. After reviewing the risks                            and benefits, the patient was deemed in                            satisfactory condition to undergo the procedure.                           After obtaining informed consent, the colonoscope  was passed under direct vision. Throughout the                            procedure, the patient's blood pressure, pulse, and                            oxygen saturations were monitored continuously. The                            CF [id number] #[id number] was introduced through the anus                            and advanced to the the cecum, identified by                            appendiceal  orifice and ileocecal valve. The                            colonoscopy was somewhat difficult due to                            significant looping and vagal effects causing                            tranisent cardiac pause.. Successful completion of                            the procedure was aided by applying abdominal                            pressure. The patient tolerated the procedure. The                            quality of the bowel preparation was good. The                            ileocecal valve, appendiceal orifice, and rectum                            were photographed. Scope In: 2:56:20 PM Scope Out: 3:09:42 PM Scope Withdrawal Time: 0 hours 8 minutes 1 second  Total Procedure Duration: 0 hours 13 minutes 22 seconds  Findings:                 The perianal and digital rectal examinations were                            normal.                           A 7 mm polyp was found in the distal sigmoid colon.                            The polyp was semi-pedunculated. The polyp was  removed with a hot snare. Resection and retrieval                            were complete. Verification of patient                            identification for the specimen was done. Estimated                            blood loss: none.                           Internal hemorrhoids were found.                           The exam was otherwise without abnormality on                            direct and retroflexion views. Complications:            No immediate complications.Did have sig vagal                            effects w/ scope in loop + abdominal pressure -                            resolved w/ glycopyrrolate and also w/drawel of                            scope Estimated Blood Loss:     Estimated blood loss: none. Impression:               - One 7 mm polyp in the distal sigmoid colon,                            removed with a hot snare. Resected and  retrieved.                           - Internal hemorrhoids.                           - The examination was otherwise normal on direct                            and retroflexion views.                           - Personal history of colonic polyps. 4 cm adenoma                            + other polyps 2009, 5 mm adenoma 29018 and a hx of                            small rectal carcinoid 2009 Recommendation:           - Patient has a contact  number available for                            emergencies. The signs and symptoms of potential                            delayed complications were discussed with the                            patient. Return to normal activities tomorrow.                            Written discharge instructions were provided to the                            patient.                           - Resume previous diet.                           - Continue present medications.                           - Repeat colonoscopy is recommended for                            surveillance. The colonoscopy date will be                            determined after pathology results from today's                            exam become available for review. Would liekely do                            at hospital, consider abdominal binder also. Gatha Mayer, MD 12/08/2021 3:26:12 PM This report has been signed electronically.

## 2021-12-08 NOTE — Progress Notes (Signed)
Called to room to assist during endoscopic procedure.  Patient ID and intended procedure confirmed with present staff. Received instructions for my participation in the procedure from the performing physician.  

## 2021-12-08 NOTE — Op Note (Signed)
[place] Patient Name: Sarah Santos Procedure Date: 12/08/2021 2:21 PM MRN: [id number] Endoscopist: Gatha Mayer , MD Age: 70 Referring MD:  Date of Birth: 07-Apr-1951 Gender: Female Account #: [id number] Procedure:                Upper GI endoscopy Indications:              Upper abdominal pain Medicines:                Propofol per Anesthesia, Monitored Anesthesia Care Procedure:                Pre-Anesthesia Assessment:                           - Prior to the procedure, a History and Physical                            was performed, and patient medications and                            allergies were reviewed. The patient's tolerance of                            previous anesthesia was also reviewed. The risks                            and benefits of the procedure and the sedation                            options and risks were discussed with the patient.                            All questions were answered, and informed consent                            was obtained. Prior Anticoagulants: The patient has                            taken no previous anticoagulant or antiplatelet                            agents. ASA Grade Assessment: II - A patient with                            mild systemic disease. After reviewing the risks                            and benefits, the patient was deemed in                            satisfactory condition to undergo the procedure.                           After obtaining informed consent, the endoscope was  passed under direct vision. Throughout the                            procedure, the patient's blood pressure, pulse, and                            oxygen saturations were monitored continuously. The                            GIF HQ190 #[id number] was introduced through the                            mouth, and advanced to the second part of duodenum.                             The patient tolerated the procedure fairly well.                            The upper GI endoscopy was accomplished without                            difficulty. Scope In: Scope Out: Findings:                 The examined esophagus was normal.                           The entire examined stomach was normal.                           The cardia and gastric fundus were normal on                            retroflexion.                           A single 6 mm sessile polyp was found in the second                            portion of the duodenum. The polyp was removed with                            a cold snare. Resection and retrieval were                            complete. Verification of patient identification                            for the specimen was done. Estimated blood loss was                            minimal.                           The exam was otherwise without abnormality. Complications:  No immediate complications. Estimated Blood Loss:     Estimated blood loss was minimal. Impression:               - Normal esophagus.                           - Normal stomach.                           - A single duodenal polyp. Resected and retrieved.                           - The examination was otherwise normal. Recommendation:           - Patient has a contact number available for                            emergencies. The signs and symptoms of potential                            delayed complications were discussed with the                            patient. Return to normal activities tomorrow.                            Written discharge instructions were provided to the                            patient.                           - Continue present medications.                           - Follow an antireflux regimen.                           - See the other procedure note for documentation of                            additional recommendations.  She had some gagging                            during this procedure and had sig vagal episode                            with colonoscopy so future procedures likely at                            hospital Gatha Mayer, MD 12/08/2021 3:19:22 PM This report has been signed electronically.

## 2021-12-08 NOTE — Progress Notes (Signed)
[facility] History and Physical   Primary Care Physician:  Hoyt Koch, MD   Reason for Procedure:   Upper abdominal pain and blood in stool  Plan:    EGD and colonoscopy     HPI: Sarah Santos is a 70 y.o. female here for evaluation of upper abdominal discomfort and hematochezia. 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   GERD    HEADACHE, CHRONIC    Heart murmur    Hematuria, microscopic    History of anemia    Hx of adenomatous colonic polyps    Hypercalcemia    HYPERLIPIDEMIA    Hypertension    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   Menopausal symptom 05/26/2018   Microscopic hematuria 07/02/2009   Qualifier: Diagnosis of  By: Asa Lente MD, Mateo Flow A    OSA on CPAP    Personal history of colonic polyps 01/04/2008   Plantar fasciitis    Primary hyperparathyroidism ([place])    Routine general medical examination at a health care facility 06/13/2015    Past Surgical History:  Procedure Laterality Date   carpal tunnel Bilateral    COLONOSCOPY W/ POLYPECTOMY  2009, 08/18/11   2009:adenomas, largest 4 cm, rectal carcinoid, hemorrhoids   OVARIAN CYST REMOVAL  70's   TUBAL LIGATION      Prior to Admission medications   Medication Sig Start Date End Date Taking? Authorizing Provider  acetaminophen (TYLENOL) 500 MG tablet Take 500 mg by mouth every 6 (six) hours as needed.   Yes [provider]  Glucosamine HCl (GLUCOSAMINE PO) Take by mouth.   Yes [provider]   loratadine (CLARITIN) 10 MG tablet Take 10 mg by mouth daily as needed for allergies.   Yes [provider]  losartan (COZAAR) 50 MG tablet TAKE 1 TABLET EVERY DAY 10/08/21  Yes Hoyt Koch, MD  losartan-hydrochlorothiazide [facility]) 50-12.5 MG tablet TAKE 1 TABLET EVERY DAY 10/08/21  Yes Hoyt Koch, MD  Multiple Vitamin (MULTIVITAMIN PO) Take by mouth.   Yes [provider]  pantoprazole (PROTONIX) 40 MG tablet TAKE 1 TABLET EVERY DAY 10/08/21  Yes Hoyt Koch, MD  simvastatin (ZOCOR) 20 MG tablet Take 1 tablet (20 mg total) by mouth 2 (two) times a week. 11/01/21  Yes Hoyt Koch, MD  Turmeric (QC TUMERIC COMPLEX PO) Take by mouth.   Yes [provider]  nystatin-triamcinolone (MYCOLOG II) cream Apply 1 application topically daily as needed. 11/03/21   Princess Bruins, MD    Current Outpatient Medications  Medication Sig Dispense Refill   acetaminophen (TYLENOL) 500 MG tablet Take 500 mg by mouth every 6 (six) hours as needed.     Glucosamine HCl (GLUCOSAMINE PO) Take by mouth.     loratadine (CLARITIN) 10 MG tablet Take 10 mg by mouth daily as needed for allergies.     losartan (COZAAR) 50 MG tablet TAKE 1 TABLET EVERY DAY 90 tablet 2   losartan-hydrochlorothiazide (HYZAAR) 50-12.5 MG tablet  TAKE 1 TABLET EVERY DAY 90 tablet 2   Multiple Vitamin (MULTIVITAMIN PO) Take by mouth.     pantoprazole (PROTONIX) 40 MG tablet TAKE 1 TABLET EVERY DAY 90 tablet 2   simvastatin (ZOCOR) 20 MG tablet Take 1 tablet (20 mg total) by mouth 2 (two) times a week. 24 tablet 3   Turmeric (QC TUMERIC COMPLEX PO) Take by mouth.     nystatin-triamcinolone (MYCOLOG II) cream Apply 1 application topically daily as needed. 30 g 4   Current Facility-Administered Medications  Medication Dose Route Frequency Provider Last Rate Last Admin   0.9 %  sodium chloride infusion  500 mL Intravenous Once Gatha Mayer, MD        Allergies as of  12/08/2021   (No Known Allergies)    Family History  Problem Relation Age of Onset   Pulmonary embolism Mother    Hypertension Mother    Stroke Father    Diabetes Father    Heart failure Father    Heart disease Father    Colon polyps Sister    Other Brother        prediabetes   Hyperlipidemia Brother    Heart disease Brother    Colon polyps Brother    Diabetes Paternal Grandmother    Stomach cancer Paternal Aunt    Cancer Paternal Aunt        Lung   Hyperlipidemia Daughter    Other Daughter        prediabetes   Colon cancer Neg Hx    Esophageal cancer Neg Hx    Rectal cancer Neg Hx     Social History   Socioeconomic History   Marital status: Divorced    Spouse name: Not on file   Number of children: 2   Years of education: Not on file   Highest education level: Not on file  Occupational History   Occupation: retired  Tobacco Use   Smoking status: Former    Types: Cigarettes    Quit date: 1980    Years since quitting: 42.9   Smokeless tobacco: Never  Vaping Use   Vaping Use: Never used  Substance and Sexual Activity   Alcohol use: No   Drug use: No   Sexual activity: Not Currently    Birth control/protection: Post-menopausal, Surgical    Comment: BTL, greater than 16, more than 5  Other Topics Concern   Not on file  Social History Narrative   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  Physical Exam: Vital signs BP (!) 147/72    Pulse 92    Temp (!) 96.8 F (36 C)    Resp 20    Ht 5' (1.524 m)    Wt 208 lb (94.3 kg)    SpO2 100%    BMI 40.62 kg/m   General:   Alert,  Well-developed, well-nourished, pleasant and cooperative in NAD Lungs:  Clear throughout to auscultation.   Heart:  Regular rate and rhythm; no murmurs, clicks, rubs,  or gallops. Abdomen:  Soft, nontender and nondistended. Normal bowel sounds.   Neuro/Psych:  Alert and cooperative. Normal  mood and affect. A and O x 3   @Alona Danford  Simonne Maffucci, MD, [facility] [phone] (pager) 12/08/2021 2:30 PM@

## 2021-12-08 NOTE — Progress Notes (Signed)
C.W. vital signs. 

## 2021-12-10 ENCOUNTER — Telehealth: Payer: Self-pay | Admitting: *Deleted

## 2021-12-10 NOTE — Telephone Encounter (Signed)
°  Follow up Call-  Call back number 12/08/2021  Post procedure Call Back phone  # [phone]  Permission to leave phone message Yes  Some recent data might be hidden     Patient questions:  Do you have a fever, pain , or abdominal swelling? No. Pain Score  0 *  Have you tolerated food without any problems? Yes.    Have you been able to return to your normal activities? Yes.    Do you have any questions about your discharge instructions: Diet   No. Medications  No. Follow up visit  No.  Do you have questions or concerns about your Care? No.  Actions: * If pain score is 4 or above: No action needed, pain <4.  Have you developed a fever since your procedure? no  2.   Have you had an respiratory symptoms (SOB or cough) since your procedure? no  3.   Have you tested positive for COVID 19 since your procedure no  4.   Have you had any family members/close contacts diagnosed with the COVID 19 since your procedure?  no   If yes to any of these questions please route to Joylene John, RN and Joella Prince, RN

## 2021-12-14 ENCOUNTER — Encounter: Payer: Self-pay | Admitting: Internal Medicine

## 2021-12-14 DIAGNOSIS — D132 Benign neoplasm of duodenum: Secondary | ICD-10-CM

## 2021-12-14 HISTORY — DX: Benign neoplasm of duodenum: D13.2

## 2021-12-23 DIAGNOSIS — H5203 Hypermetropia, bilateral: Secondary | ICD-10-CM | POA: Diagnosis not present

## 2021-12-24 DIAGNOSIS — H52223 Regular astigmatism, bilateral: Secondary | ICD-10-CM | POA: Diagnosis not present

## 2021-12-24 DIAGNOSIS — H524 Presbyopia: Secondary | ICD-10-CM | POA: Diagnosis not present

## 2022-01-18 IMAGING — DX DG KNEE AP/LAT W/ SUNRISE*R*
3 series · 3 of 3 positions shown · non-contrast
Comparison: 08/02/2019

CLINICAL DATA: Generalized pain for 2 months, no known injury

EXAM:
RIGHT KNEE 3 VIEWS

[knee ap]
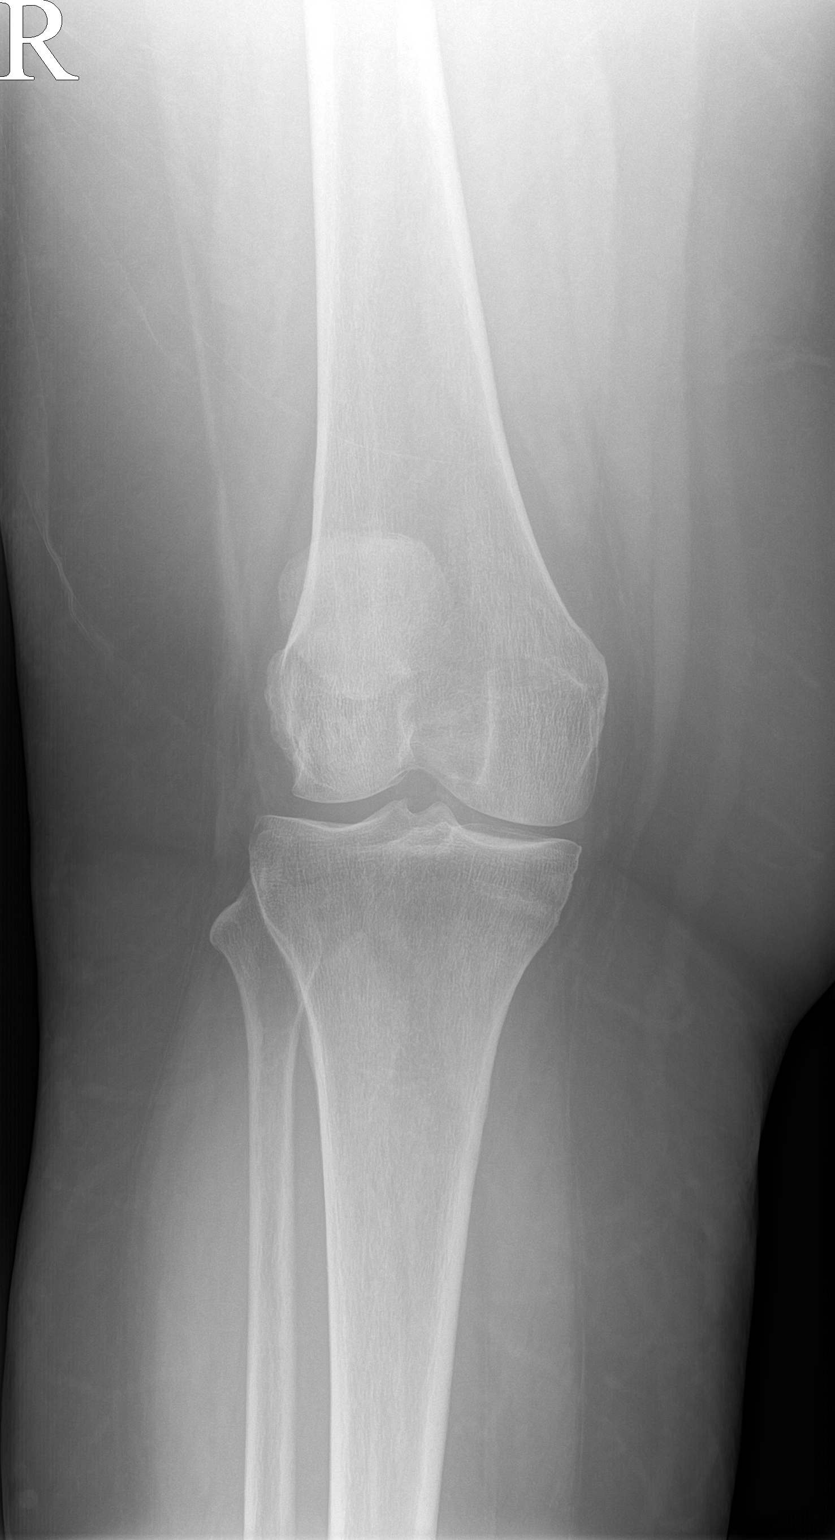

[knee lat]
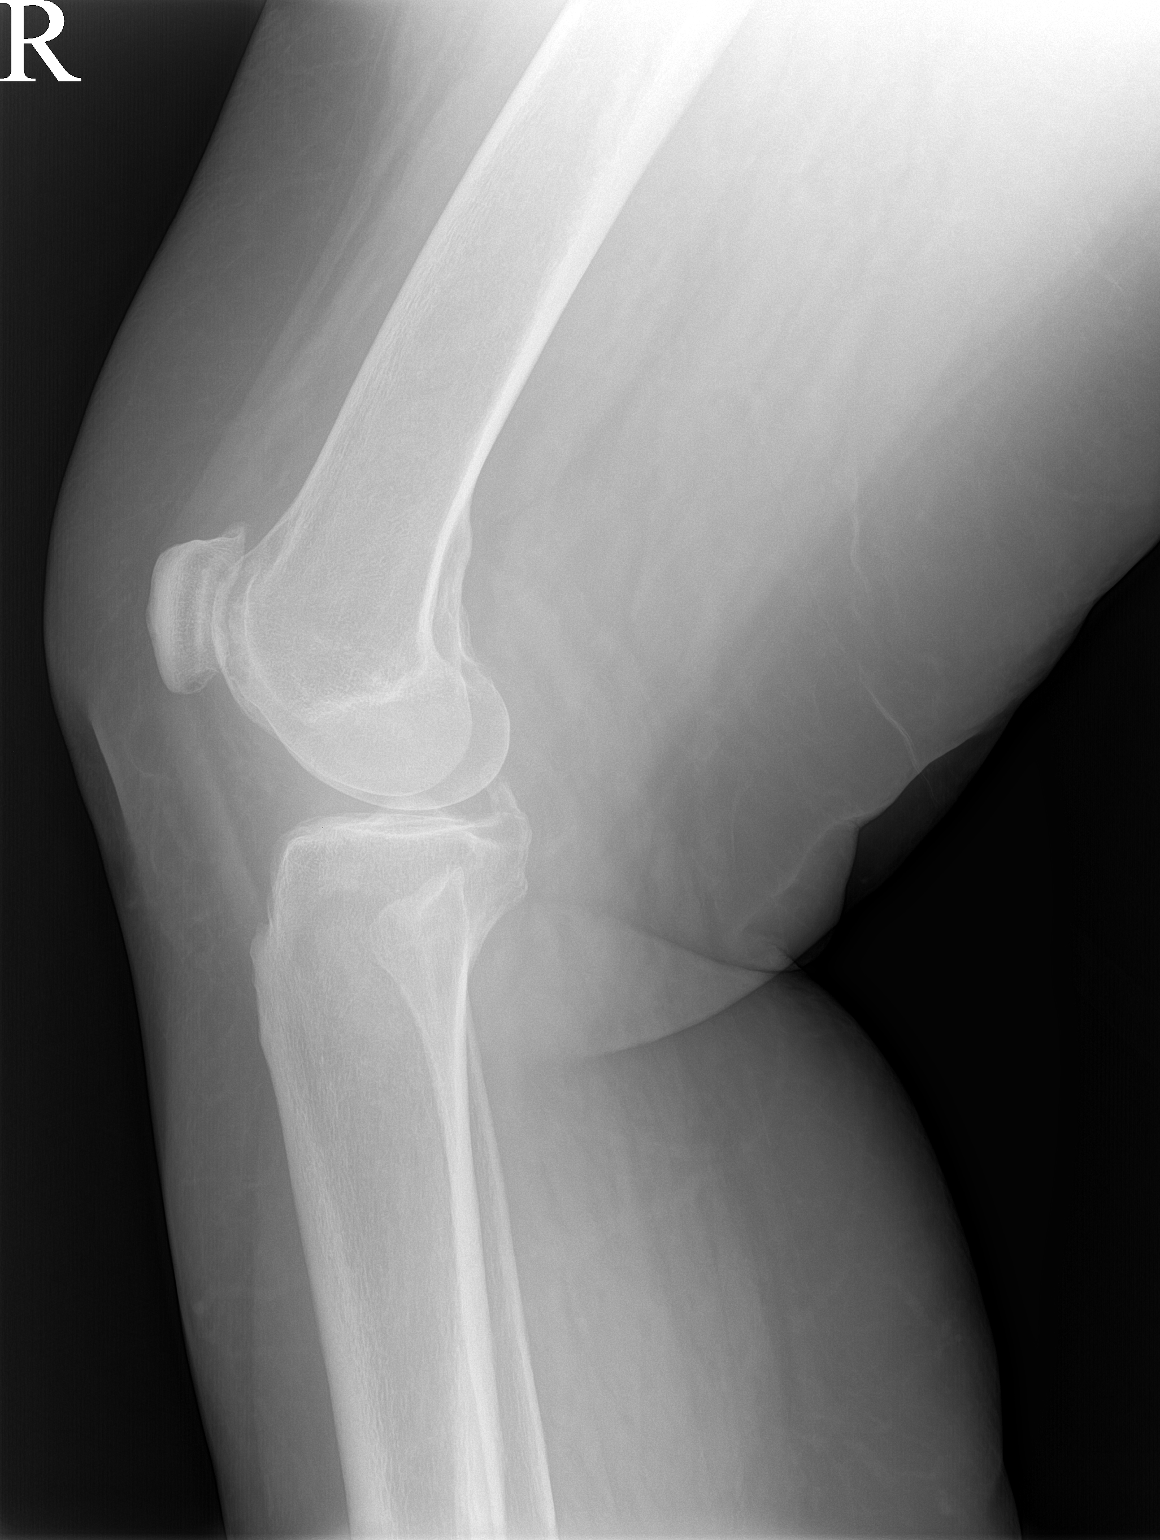

[patella]
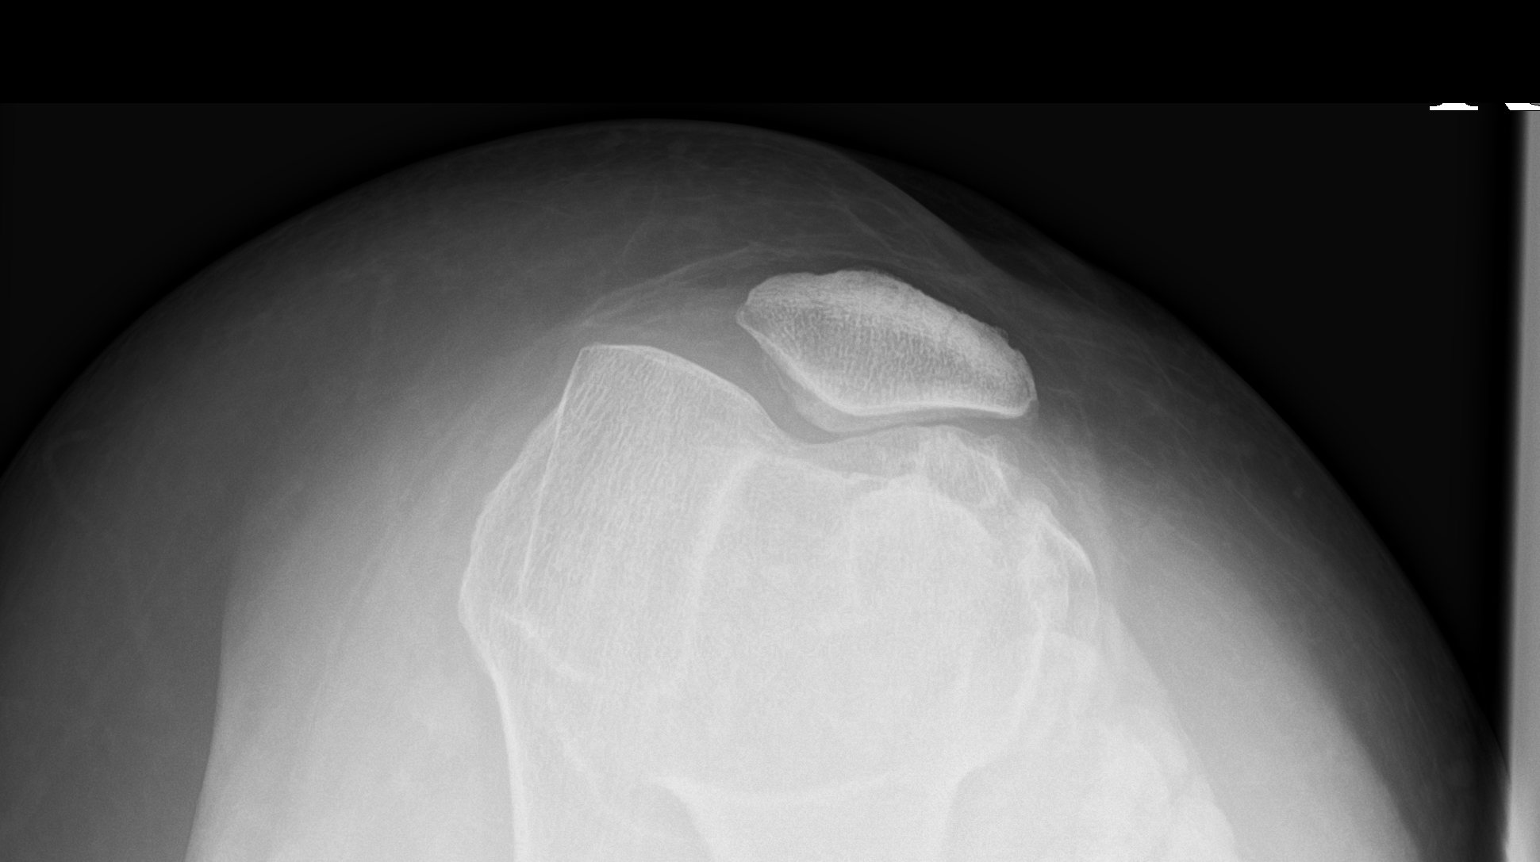

[3 of 3 positions shown; findings below may reference images not displayed]

FINDINGS: Osseous demineralization.

Mild narrowing at medial compartment and patellofemoral joint.

Small cranial spur at articular margin of patella.

No fracture, dislocation, or bone destruction.

No knee joint effusion.
IMPRESSION: Degenerative changes RIGHT knee without acute bony abnormalities.

## 2022-04-12 ENCOUNTER — Telehealth: Payer: Self-pay | Admitting: Internal Medicine

## 2022-04-12 NOTE — Telephone Encounter (Signed)
N/A unable to leave a message for patient to call back to schedule Medicare Annual Wellness Visit  ? ?Last AWV  04/13/21 ? ?Please schedule at anytime with [facility] if patient calls the office back.   ? ? ?Any questions, please call me at [phone]  ?

## 2022-04-18 ENCOUNTER — Ambulatory Visit (INDEPENDENT_AMBULATORY_CARE_PROVIDER_SITE_OTHER): Payer: Medicare HMO

## 2022-04-18 DIAGNOSIS — Z0000 Encounter for general adult medical examination without abnormal findings: Secondary | ICD-10-CM

## 2022-04-18 DIAGNOSIS — Z1231 Encounter for screening mammogram for malignant neoplasm of breast: Secondary | ICD-10-CM | POA: Diagnosis not present

## 2022-04-18 NOTE — Patient Instructions (Signed)
[name] , ?Thank you for taking time to come for your Medicare Wellness Visit. I appreciate your ongoing commitment to your health goals. Please review the following plan we discussed and let me know if I can assist you in the future.  ? ?Screening recommendations/referrals: ?Colonoscopy: 12/08/2021 ?Mammogram: referral 04/18/2021 ?Bone Density: 04/07/2017 ?Recommended yearly ophthalmology/optometry visit for glaucoma screening and checkup ?Recommended yearly dental visit for hygiene and checkup ? ?Vaccinations: ?Influenza vaccine: completed  ?Pneumococcal vaccine: due  ?Tdap vaccine: due  ?Shingles vaccine: will consider    ? ?Advanced directives: none  ? ?Conditions/risks identified: none  ? ?Next appointment: none  ? ? ?Preventive Care 70 Years and Older, Female ?Preventive care refers to lifestyle choices and visits with your health care provider that can promote health and wellness. ?What does preventive care include? ?A yearly physical exam. This is also called an annual well check. ?Dental exams once or twice a year. ?Routine eye exams. Ask your health care provider how often you should have your eyes checked. ?Personal lifestyle choices, including: ?Daily care of your teeth and gums. ?Regular physical activity. ?Eating a healthy diet. ?Avoiding tobacco and drug use. ?Limiting alcohol use. ?Practicing safe sex. ?Taking low-dose aspirin every day. ?Taking vitamin and mineral supplements as recommended by your health care provider. ?What happens during an annual well check? ?The services and screenings done by your health care provider during your annual well check will depend on your age, overall health, lifestyle risk factors, and family history of disease. ?Counseling  ?Your health care provider may ask you questions about your: ?Alcohol use. ?Tobacco use. ?Drug use. ?Emotional well-being. ?Home and relationship well-being. ?Sexual activity. ?Eating habits. ?History of falls. ?Memory and ability  to understand (cognition). ?Work and work Statistician. ?Reproductive health. ?Screening  ?You may have the following tests or measurements: ?Height, weight, and BMI. ?Blood pressure. ?Lipid and cholesterol levels. These may be checked every 5 years, or more frequently if you are over 70 years old. ?Skin check. ?Lung cancer screening. You may have this screening every year starting at age 70 if you have a 30-pack-year history of smoking and currently smoke or have quit within the past 15 years. ?Fecal occult blood test (FOBT) of the stool. You may have this test every year starting at age 70. ?Flexible sigmoidoscopy or colonoscopy. You may have a sigmoidoscopy every 5 years or a colonoscopy every 10 years starting at age 70. ?Hepatitis C blood test. ?Hepatitis B blood test. ?Sexually transmitted disease (STD) testing. ?Diabetes screening. This is done by checking your blood sugar (glucose) after you have not eaten for a while (fasting). You may have this done every 1-3 years. ?Bone density scan. This is done to screen for osteoporosis. You may have this done starting at age 70. ?Mammogram. This may be done every 1-2 years. Talk to your health care provider about how often you should have regular mammograms. ?Talk with your health care provider about your test results, treatment options, and if necessary, the need for more tests. ?Vaccines  ?Your health care provider may recommend certain vaccines, such as: ?Influenza vaccine. This is recommended every year. ?Tetanus, diphtheria, and acellular pertussis (Tdap, Td) vaccine. You may need a Td booster every 10 years. ?Zoster vaccine. You may need this after age 80. ?Pneumococcal 13-valent conjugate (PCV13) vaccine. One dose is recommended after age 70. ?Pneumococcal polysaccharide (PPSV23) vaccine. One dose is recommended after age 70. ?Talk to your health care provider about which screenings and vaccines you need and  how often you need them. ?This information is not  intended to replace advice given to you by your health care provider. Make sure you discuss any questions you have with your health care provider. ?Document Released: 01/08/2016 Document Revised: 08/31/2016 Document Reviewed: 10/13/2015 ?Elsevier Interactive Patient Education ? 2017 [place]. ? ?Fall Prevention in the Home ?Falls can cause injuries. They can happen to people of all ages. There are many things you can do to make your home safe and to help prevent falls. ?What can I do on the outside of my home? ?Regularly fix the edges of walkways and driveways and fix any cracks. ?Remove anything that might make you trip as you walk through a door, such as a raised step or threshold. ?Trim any bushes or trees on the path to your home. ?Use bright outdoor lighting. ?Clear any walking paths of anything that might make someone trip, such as rocks or tools. ?Regularly check to see if handrails are loose or broken. Make sure that both sides of any steps have handrails. ?Any raised decks and porches should have guardrails on the edges. ?Have any leaves, snow, or ice cleared regularly. ?Use sand or salt on walking paths during winter. ?Clean up any spills in your garage right away. This includes oil or grease spills. ?What can I do in the bathroom? ?Use night lights. ?Install grab bars by the toilet and in the tub and shower. Do not use towel bars as grab bars. ?Use non-skid mats or decals in the tub or shower. ?If you need to sit down in the shower, use a plastic, non-slip stool. ?Keep the floor dry. Clean up any water that spills on the floor as soon as it happens. ?Remove soap buildup in the tub or shower regularly. ?Attach bath mats securely with double-sided non-slip rug tape. ?Do not have throw rugs and other things on the floor that can make you trip. ?What can I do in the bedroom? ?Use night lights. ?Make sure that you have a light by your bed that is easy to reach. ?Do not use any sheets or blankets that are  too big for your bed. They should not hang down onto the floor. ?Have a firm chair that has side arms. You can use this for support while you get dressed. ?Do not have throw rugs and other things on the floor that can make you trip. ?What can I do in the kitchen? ?Clean up any spills right away. ?Avoid walking on wet floors. ?Keep items that you use a lot in easy-to-reach places. ?If you need to reach something above you, use a strong step stool that has a grab bar. ?Keep electrical cords out of the way. ?Do not use floor polish or wax that makes floors slippery. If you must use wax, use non-skid floor wax. ?Do not have throw rugs and other things on the floor that can make you trip. ?What can I do with my stairs? ?Do not leave any items on the stairs. ?Make sure that there are handrails on both sides of the stairs and use them. Fix handrails that are broken or loose. Make sure that handrails are as long as the stairways. ?Check any carpeting to make sure that it is firmly attached to the stairs. Fix any carpet that is loose or worn. ?Avoid having throw rugs at the top or bottom of the stairs. If you do have throw rugs, attach them to the floor with carpet tape. ?Make sure that you have  a light switch at the top of the stairs and the bottom of the stairs. If you do not have them, ask someone to add them for you. ?What else can I do to help prevent falls? ?Wear shoes that: ?Do not have high heels. ?Have rubber bottoms. ?Are comfortable and fit you well. ?Are closed at the toe. Do not wear sandals. ?If you use a stepladder: ?Make sure that it is fully opened. Do not climb a closed stepladder. ?Make sure that both sides of the stepladder are locked into place. ?Ask someone to hold it for you, if possible. ?Clearly mark and make sure that you can see: ?Any grab bars or handrails. ?First and last steps. ?Where the edge of each step is. ?Use tools that help you move around (mobility aids) if they are needed. These  include: ?Canes. ?Walkers. ?Scooters. ?Crutches. ?Turn on the lights when you go into a dark area. Replace any light bulbs as soon as they burn out. ?Set up your furniture so you have a clear path. Avoid moving

## 2022-04-18 NOTE — Progress Notes (Signed)
? ?Subjective:  ? Sarah Santos is a 71 y.o. female who presents for Medicare Annual (Subsequent) preventive examination. ? ?I connected with Gaston Islam  today by telephone and verified that I am speaking with the correct person using two identifiers. ?Location patient: home ?Location provider: work ?Persons participating in the virtual visit: patient, provider. ?  ?I discussed the limitations, risks, security and privacy concerns of performing an evaluation and management service by telephone and the availability of in person appointments. I also discussed with the patient that there may be a patient responsible charge related to this service. The patient expressed understanding and verbally consented to this telephonic visit.  ?  ?Interactive audio and video telecommunications were attempted between this provider and patient, however failed, due to patient having technical difficulties OR patient did not have access to video capability.  We continued and completed visit with audio only. ? ?  ?Review of Systems    ? ?Cardiac Risk Factors include: advanced age (>33mn, >>58women) ? ?   ?Objective:  ?  ?Today's Vitals  ? ?There is no height or weight on file to calculate BMI. ? ? ?  04/18/2022  ?  9:10 AM 06/09/2020  ?  2:30 PM 05/12/2017  ?  7:35 AM  ?Advanced Directives  ?Does Patient Have a Medical Advance Directive? Yes Yes Yes  ?Type of [place]  [place]  ?Does patient want to make changes to medical advance directive?  No - Patient declined   ?Copy of [place]? No - copy requested  No - copy requested  ? ? ?Current Medications (verified) ?Outpatient Encounter Medications as of 04/18/2022  ?Medication Sig  ? acetaminophen (TYLENOL) 500 MG tablet Take 500 mg by mouth every 6 (six) hours as needed.  ? Glucosamine HCl (GLUCOSAMINE PO) Take by mouth.  ? loratadine (CLARITIN) 10 MG tablet Take  10 mg by mouth daily as needed for allergies.  ? losartan (COZAAR) 50 MG tablet TAKE 1 TABLET EVERY DAY  ? losartan-hydrochlorothiazide (HYZAAR) 50-12.5 MG tablet TAKE 1 TABLET EVERY DAY  ? Multiple Vitamin (MULTIVITAMIN PO) Take by mouth.  ? nystatin-triamcinolone (MYCOLOG II) cream Apply 1 application topically daily as needed.  ? pantoprazole (PROTONIX) 40 MG tablet TAKE 1 TABLET EVERY DAY  ? simvastatin (ZOCOR) 20 MG tablet Take 1 tablet (20 mg total) by mouth 2 (two) times a week.  ? Turmeric (QC TUMERIC COMPLEX PO) Take by mouth.  ? ?No facility-administered encounter medications on file as of 04/18/2022.  ? ? ?Allergies (verified) ?Patient has no known allergies.  ? ?History: ?Past Medical History:  ?Diagnosis Date  ? ALLERGIC RHINITIS   ? Anemia   ? Atypical chest pain 07/17/2019  ? Carcinoid tumor of colon 2009  ? rectum - removed with colonoscopy  ? Chronic pain of left knee 07/17/2019  ? Degenerative arthritis of left knee 08/02/2019  ? 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 Dyslipidemia 05/08/2009  ?  On Simvastatin   ? Dyspnea on exertion 07/17/2019  ? 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 GERD   ? HEADACHE, CHRONIC   ? Heart murmur   ? Hematuria, microscopic   ? History of anemia   ? Hx of adenomatous colonic polyps   ? Hypercalcemia   ? HYPERLIPIDEMIA   ? Hypertension   ? 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 Menopausal symptom 05/26/2018  ? Microscopic hematuria 07/02/2009  ? Qualifier: Diagnosis of  By: Asa Lente MD, Valerie A   ? OSA on CPAP   ? Personal history of colonic polyps 01/04/2008  ? Plantar fasciitis   ? Primary hyperparathyroidism ([place])   ? Routine general medical examination at a health care facility 06/13/2015  ? ?Past Surgical History:  ?Procedure Laterality Date  ? carpal tunnel Bilateral   ? COLONOSCOPY W/ POLYPECTOMY  2009, 08/18/11  ? 2009:adenomas, largest 4 cm, rectal carcinoid, hemorrhoids  ? OVARIAN CYST REMOVAL  70's  ? TUBAL LIGATION    ? ?Family History  ?Problem Relation Age of Onset   ? Pulmonary embolism Mother   ? Hypertension Mother   ? Stroke Father   ? Diabetes Father   ? Heart failure Father   ? Heart disease Father   ? Colon polyps Sister   ? Other Brother   ?     prediabetes  ? Hyperlipidemia Brother   ? Heart disease Brother   ? Colon polyps Brother   ? Diabetes Paternal Grandmother   ? Stomach cancer Paternal Aunt   ? Cancer Paternal Aunt   ?     Lung  ? Hyperlipidemia Daughter   ? Other Daughter   ?     prediabetes  ? Colon cancer Neg Hx   ? Esophageal cancer Neg Hx   ? Rectal cancer Neg Hx   ? ?Social History  ? ?Socioeconomic History  ? Marital status: Divorced  ?  Spouse name: Not on file  ? Number of children: 2  ? Years of education: Not on file  ? Highest education level: Not on file  ?Occupational History  ? Occupation: retired  ?Tobacco Use  ? Smoking status: Former  ?  Types: Cigarettes  ?  Quit date: 8  ?  Years since quitting: 43.3  ? Smokeless tobacco: Never  ?Vaping Use  ? Vaping Use: Never used  ?Substance and Sexual Activity  ? Alcohol use: No  ? Drug use: No  ? Sexual activity: Not Currently  ?  Birth control/protection: Post-menopausal, Surgical  ?  Comment: BTL, greater than 16, more than 5  ?Other Topics Concern  ? Not on file  ?Social History Narrative  ? 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 ?Social Determinants of Health  ? ?Financial Resource Strain: Low Risk   ? Difficulty of Paying Living Expenses: Not hard at all  ?Food Insecurity: No Food Insecurity  ? Worried About Charity fundraiser in the Last Year: Never true  ? Ran Out of Food in the Last Year: Never true  ?Transportation Needs: No Transportation Needs  ? Lack of Transportation (Medical): No  ? Lack of Transportation (Non-Medical): No  ?Physical Activity: Insufficiently Active  ? Days of Exercise per Week: 3 days  ? Minutes of Exercise per Session: 40 min  ?Stress: No Stress Concern Present  ? Feeling of Stress : Not at all  ?Social Connections: Moderately Isolated  ? Frequency of  Communication with Friends and Family: Twice a week  ? Frequency of Social Gatherings with Friends and Family: Twice a week  ? Attends Religious Services: More than 4 times per year  ? Active Member of Clubs or Organizations: No  ? Attends Archivist Meetings: Never  ? Marital Status: Divorced  ? ? ?Tobacco Counseling ?Counseling given: Not Answered ? ? ?Clinical Intake: ? ?Pre-visit preparation completed: Yes ? ?Pain : No/denies pain ? ?  ? ?Nutritional Risks: None ?Diabetes: No ? ?How often do you need to have  someone help you when you read instructions, pamphlets, or other written materials from your doctor or pharmacy?: 1 - Never ?What is the last grade level you completed in school?: college ? ?Diabetic?no  ? ?Interpreter Needed?: No ? ?Information entered by :: [email or web address] ? ? ?Activities of Daily Living ? ?  04/18/2022  ?  9:17 AM  ?In your present state of health, do you have any difficulty performing the following activities:  ?Hearing? 0  ?Vision? 0  ?Difficulty concentrating or making decisions? 0  ?Walking or climbing stairs? 0  ?Dressing or bathing? 0  ?Doing errands, shopping? 0  ?Preparing Food and eating ? N  ?Using the Toilet? N  ?In the past six months, have you accidently leaked urine? N  ?Do you have problems with loss of bowel control? N  ?Managing your Medications? N  ?Managing your Finances? N  ?Housekeeping or managing your Housekeeping? N  ? ? ?Patient Care Team: ?Hoyt Koch, MD as PCP - General (Internal Medicine) ?Richardo Priest, MD as PCP - Cardiology (Cardiology) ?Gatha Mayer, MD (Gastroenterology) ?Marylynn Pearson, MD (Obstetrics and Gynecology) ?Bjorn Loser, MD (Urology) ? ?Indicate any recent Medical Services you may have received from other than [facility] providers in the past year (date may be approximate). ? ?   ?Assessment:  ? This is a routine wellness examination for [employer] ? ?Hearing/Vision screen ?Vision Screening - Comments:: Annual eye exams  wear glasses  ? ?Dietary issues and exercise activities discussed: ?Current Exercise Habits: Home exercise routine, Type of exercise: walking, Time (Minutes): 40, Frequency (Times/Week): 3, Weekly Exercise (Mi

## 2022-04-25 ENCOUNTER — Ambulatory Visit: Payer: Medicare HMO

## 2022-04-26 ENCOUNTER — Encounter: Payer: Self-pay | Admitting: Internal Medicine

## 2022-04-26 ENCOUNTER — Ambulatory Visit
Admission: RE | Admit: 2022-04-26 | Discharge: 2022-04-26 | Disposition: A | Discharge status: Home / Self Care | Payer: Medicare HMO | Source: Ambulatory Visit | Attending: Internal Medicine | Admitting: Internal Medicine

## 2022-04-26 ENCOUNTER — Ambulatory Visit (INDEPENDENT_AMBULATORY_CARE_PROVIDER_SITE_OTHER): Payer: Medicare HMO | Admitting: Internal Medicine

## 2022-04-26 ENCOUNTER — Encounter: Payer: Medicare HMO | Admitting: Internal Medicine

## 2022-04-26 VITALS — BP 124/80 | HR 77 | Resp 18 | Ht 60.0 in | Wt 212.8 lb

## 2022-04-26 DIAGNOSIS — Z0000 Encounter for general adult medical examination without abnormal findings: Secondary | ICD-10-CM | POA: Diagnosis not present

## 2022-04-26 DIAGNOSIS — K118 Other diseases of salivary glands: Secondary | ICD-10-CM

## 2022-04-26 DIAGNOSIS — R0609 Other forms of dyspnea: Secondary | ICD-10-CM

## 2022-04-26 DIAGNOSIS — Z23 Encounter for immunization: Secondary | ICD-10-CM | POA: Diagnosis not present

## 2022-04-26 DIAGNOSIS — Z1231 Encounter for screening mammogram for malignant neoplasm of breast: Secondary | ICD-10-CM | POA: Diagnosis not present

## 2022-04-26 DIAGNOSIS — K219 Gastro-esophageal reflux disease without esophagitis: Secondary | ICD-10-CM | POA: Diagnosis not present

## 2022-04-26 DIAGNOSIS — E785 Hyperlipidemia, unspecified: Secondary | ICD-10-CM

## 2022-04-26 DIAGNOSIS — R7303 Prediabetes: Secondary | ICD-10-CM | POA: Diagnosis not present

## 2022-04-26 DIAGNOSIS — G4733 Obstructive sleep apnea (adult) (pediatric): Secondary | ICD-10-CM | POA: Diagnosis not present

## 2022-04-26 DIAGNOSIS — Z9989 Dependence on other enabling machines and devices: Secondary | ICD-10-CM

## 2022-04-26 DIAGNOSIS — I10 Essential (primary) hypertension: Secondary | ICD-10-CM | POA: Diagnosis not present

## 2022-04-26 DIAGNOSIS — E210 Primary hyperparathyroidism: Secondary | ICD-10-CM

## 2022-04-26 DIAGNOSIS — R600 Localized edema: Secondary | ICD-10-CM

## 2022-04-26 LAB — HEMOGLOBIN A1C: Hgb A1c MFr Bld: 6.1 % (ref 4.6–6.5)

## 2022-04-26 LAB — LIPID PANEL
Cholesterol: 251 mg/dL — ABNORMAL HIGH (ref 0–200)
HDL: 65.9 mg/dL (ref >39.00)
LDL Cholesterol: 164 mg/dL — ABNORMAL HIGH (ref 0–99)
NonHDL: 185.21
Total CHOL/HDL Ratio: 4
Triglycerides: 107 mg/dL (ref 0.0–149.0)
VLDL: 21.4 mg/dL (ref 0.0–40.0)

## 2022-04-26 LAB — CBC
HCT: 34.1 % — ABNORMAL LOW (ref 36.0–46.0)
Hemoglobin: 11.1 g/dL — ABNORMAL LOW (ref 12.0–15.0)
MCHC: 32.7 g/dL (ref 30.0–36.0)
MCV: 80.6 fl (ref 78.0–100.0)
Platelets: 276 10*3/uL (ref 150.0–400.0)
RBC: 4.23 Mil/uL (ref 3.87–5.11)
RDW: 14.2 % (ref 11.5–15.5)
WBC: 7.3 10*3/uL (ref 4.0–10.5)

## 2022-04-26 MED ORDER — LOSARTAN POTASSIUM 50 MG PO TABS
50.0000 mg | ORAL_TABLET | Freq: Every day | ORAL | 3 refills | Status: DC
Start: 2022-04-26 — End: 2023-05-01

## 2022-04-26 MED ORDER — LOSARTAN POTASSIUM-HCTZ 50-12.5 MG PO TABS: 1.0000 | ORAL_TABLET | Freq: Every day | ORAL | 3 refills | Status: DC

## 2022-04-26 NOTE — Assessment & Plan Note (Signed)
Overall stable and no infection in lungs on prior workup. Checking CBC to rule out anemia. ?

## 2022-04-26 NOTE — Assessment & Plan Note (Signed)
Still using CPAP.  ?

## 2022-04-26 NOTE — Assessment & Plan Note (Signed)
Flu shot yearly. Covid-19 counseled. Pneumonia 20 given today. Shingrix declines. Tetanus declines. Colonoscopy due 2027. Mammogram getting done today, pap smear aged out and dexa due 2023 or 2024. Counseled about sun safety and mole surveillance. Counseled about the dangers of distracted driving. Given 10 year screening recommendations.  ? ?

## 2022-04-26 NOTE — Assessment & Plan Note (Addendum)
Due for DEXA later 2023 or 2024. Checking CMP. ?

## 2022-04-26 NOTE — Assessment & Plan Note (Signed)
Checking lipid panel and adjust simvastatin 20 mg twice a week.  ?

## 2022-04-26 NOTE — Assessment & Plan Note (Signed)
Checking lipid panel and DEY8X for complications. Counseled about diet and exercise to help. ?

## 2022-04-26 NOTE — Assessment & Plan Note (Signed)
Taking protonix 40 mg daily and controlled will continue.  ?

## 2022-04-26 NOTE — Progress Notes (Signed)
? ?  Subjective:  ? ?Patient ID: Sarah Santos, female    DOB: 06-Oct-1951, 71 y.o.   MRN: [id number] ? ?HPI ?The patient is here for physical. Has some concerns as well.  ? ?PMH, [facility], social history reviewed and updated ? ?Review of Systems  ?Constitutional: Negative.   ?HENT: Negative.    ?     Neck swelling  ?Eyes: Negative.   ?Respiratory:  Negative for cough, chest tightness and shortness of breath.   ?Cardiovascular:  Negative for chest pain, palpitations and leg swelling.  ?Gastrointestinal:  Negative for abdominal distention, abdominal pain, constipation, diarrhea, nausea and vomiting.  ?Musculoskeletal:  Positive for arthralgias.  ?Skin: Negative.   ?Neurological: Negative.   ?Psychiatric/Behavioral: Negative.    ? ?Objective:  ?Physical Exam ?Constitutional:   ?   Appearance: She is well-developed. She is obese.  ?HENT:  ?   Head: Normocephalic and atraumatic.  ?Cardiovascular:  ?   Rate and Rhythm: Normal rate and regular rhythm.  ?Pulmonary:  ?   Effort: Pulmonary effort is normal. No respiratory distress.  ?   Breath sounds: Normal breath sounds. No wheezing or rales.  ?Abdominal:  ?   General: Bowel sounds are normal. There is no distension.  ?   Palpations: Abdomen is soft.  ?   Tenderness: There is no abdominal tenderness. There is no rebound.  ?Musculoskeletal:  ?   Cervical back: Normal range of motion.  ?Skin: ?   General: Skin is warm and dry.  ?Neurological:  ?   Mental Status: She is alert and oriented to person, place, and time.  ?   Coordination: Coordination normal.  ? ? ?Vitals:  ? 04/26/22 0840  ?BP: 124/80  ?Pulse: 77  ?Resp: 18  ?SpO2: 97%  ?Weight: 212 lb 12.8 oz (96.5 kg)  ?Height: 5' (1.524 m)  ? ?This visit occurred during the SARS-CoV-2 public health emergency.  Safety protocols were in place, including screening questions prior to the visit, additional usage of staff PPE, and extensive cleaning of exam room while observing appropriate contact time as indicated for  disinfecting solutions.  ? ?Assessment & Plan:  ?Prevnar 20 given at visit ?

## 2022-04-26 NOTE — Assessment & Plan Note (Signed)
Ordered US soft tissue neck to assess, the area has gotten bigger lately. Prior CT 2014 without mass.  ?

## 2022-05-03 ENCOUNTER — Ambulatory Visit
Admission: RE | Admit: 2022-05-03 | Discharge: 2022-05-03 | Disposition: A | Discharge status: Home / Self Care | Payer: Medicare HMO | Source: Ambulatory Visit | Attending: Internal Medicine | Admitting: Internal Medicine

## 2022-05-03 DIAGNOSIS — K118 Other diseases of salivary glands: Secondary | ICD-10-CM | POA: Diagnosis not present

## 2022-05-03 DIAGNOSIS — E041 Nontoxic single thyroid nodule: Secondary | ICD-10-CM | POA: Diagnosis not present

## 2022-05-03 DIAGNOSIS — R600 Localized edema: Secondary | ICD-10-CM

## 2022-05-30 ENCOUNTER — Encounter: Payer: Self-pay | Admitting: Nurse Practitioner

## 2022-05-30 ENCOUNTER — Telehealth (INDEPENDENT_AMBULATORY_CARE_PROVIDER_SITE_OTHER): Payer: Medicare HMO | Admitting: Nurse Practitioner

## 2022-05-30 VITALS — BP 133/75 | HR 102 | Temp 99.9°F

## 2022-05-30 DIAGNOSIS — U071 COVID-19: Secondary | ICD-10-CM

## 2022-05-30 MED ORDER — MOLNUPIRAVIR EUA 200MG CAPSULE: 4.0000 | ORAL_CAPSULE | Freq: Two times a day (BID) | ORAL | 0 refills | Status: AC

## 2022-05-30 MED ORDER — BENZONATATE 100 MG PO CAPS: 100.0000 mg | ORAL_CAPSULE | Freq: Two times a day (BID) | ORAL | 0 refills | Status: DC | PRN

## 2022-05-30 NOTE — Progress Notes (Signed)
Established Patient Office Visit  An audio-only tele-health visit was completed today for this patient. I connected with  Keerthana Vanrossum on 05/30/22 utilizing audio-only technology and verified that I am speaking with the correct person using two identifiers. The patient was located at their home, and I was located at the office of [place] at [facility] during the encounter. I discussed the limitations of evaluation and management by telemedicine. The patient expressed understanding and agreed to proceed.     Subjective   Patient ID: Sarah Santos, female    DOB: 07-Mar-1951  Age: 71 y.o. MRN: [id number]  Chief Complaint  Patient presents with   Covid Positive    Patient arrives for telephone visit for the above.  She reports that her symptoms started approximately 2 days ago.  That evening she started coughing and has been experiencing fatigue since then as well.  She is also experiencing low-grade fever, sore throat, sputum production, and loose stools.  She denies any shortness of breath or wheezing.  She has been fully vaccinated against COVID-19 infection.  She has not had COVID-19 in the past.  Significant past medical history is positive for hypertension, hyperlipidemia, obesity, chronic kidney disease with last eGFR of 46, and heart murmur.  She has been taking Tylenol over-the-counter as needed.   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  HENT:  Positive for sore throat.   Respiratory:  Positive for cough and sputum production. Negative for shortness of breath and wheezing.   Cardiovascular:  Negative for chest pain and palpitations.  Gastrointestinal:  Positive for diarrhea (loose stools). Negative for abdominal pain, nausea and vomiting.  Musculoskeletal:  Positive for back pain, joint pain (right shoulder) and myalgias.  Neurological:  Positive for headaches.     Objective:     BP 133/75   Pulse (!) 102   Temp 99.9 F (37.7 C) Comment: earlier this morning BP Readings from Last 3 Encounters:  05/30/22 133/75  04/26/22 124/80  12/08/21 (!) 142/67   Wt Readings from Last 3 Encounters:  04/26/22 212 lb 12.8 oz (96.5 kg)  12/08/21 208 lb (94.3 kg)  11/03/21 208 lb (94.3 kg)      Physical Exam Comprehensive physical exam not completed today as office visit was conducted remotely.  Patient sounded well over the phone, she coughed  a couple of times.  She did not need to stop talking in order to breathe..  Patient was alert and oriented, and appeared to have appropriate judgment.   No results found for any visits on 05/30/22.  Last metabolic panel Lab Results  Component Value Date   GLUCOSE 88 10/26/2021   NA 138 10/26/2021   K 3.6 10/26/2021   CL 102 10/26/2021   CO2 29 10/26/2021   BUN 21 10/26/2021   CREATININE 1.19 10/26/2021   GFRNONAA 55 (L) 10/13/2020   CALCIUM 10.7 (H) 10/26/2021   PROT 8.1 10/26/2021   ALBUMIN 4.5 10/26/2021    LABGLOB 3.2 10/13/2020   AGRATIO 1.3 10/13/2020   BILITOT 0.3 10/26/2021   ALKPHOS 99 10/26/2021   AST 16 10/26/2021   ALT 16 10/26/2021      The 10-year ASCVD risk score (Arnett DK, et al., 2019) is: 15.9%    Assessment & Plan:   1.  COVID-19: Acute infection.  Recommend treatment with molnupiravir as patient has chronic kidney disease.  She was counseled to also rest, drink water, isolate through day 5 of illness.  Prescription for Ladona Ridgel sent to pharmacy for cough suppression.  She was also encouraged to consider Robitussin-DM as needed for cough suppression.  She was encouraged to drink plenty of fluids.  She was told if on day 5 of illness if symptoms improve she can consider ending her isolation but should wear a mask until day 10 of illness.  She reports her understanding.  She was counseled on calling 911 or proceeding to the emergency department if she experiences chest pain, fever, shortness of breath, and/or confusion.  She reports her understanding.  Problem List Items Addressed This Visit   None Visit Diagnoses     COVID-19    -  Primary   Relevant Medications   molnupiravir EUA (LAGEVRIO) 200 mg CAPS capsule   benzonatate (TESSALON) 100 MG capsule       Return if symptoms worsen or fail to improve.  To times from the telephone today was 20 minutes.   Ailene Ards, NP

## 2022-06-02 ENCOUNTER — Telehealth: Payer: Self-pay | Admitting: Internal Medicine

## 2022-06-02 NOTE — Telephone Encounter (Signed)
Patient started taking the monolnupiravir - she is experiencing dizziness this morning - patient wants to know if she should continue this medcication.

## 2022-06-06 NOTE — Telephone Encounter (Signed)
Called pt. LDVM at [phone] to give our office a call back to discuss her symptoms. Office number was provided

## 2022-09-19 ENCOUNTER — Other Ambulatory Visit: Payer: Self-pay | Admitting: Internal Medicine

## 2022-12-05 DIAGNOSIS — H524 Presbyopia: Secondary | ICD-10-CM | POA: Diagnosis not present

## 2022-12-05 DIAGNOSIS — H5203 Hypermetropia, bilateral: Secondary | ICD-10-CM | POA: Diagnosis not present

## 2022-12-05 DIAGNOSIS — H52223 Regular astigmatism, bilateral: Secondary | ICD-10-CM | POA: Diagnosis not present

## 2022-12-05 DIAGNOSIS — H25813 Combined forms of age-related cataract, bilateral: Secondary | ICD-10-CM | POA: Diagnosis not present

## 2022-12-13 DIAGNOSIS — H52209 Unspecified astigmatism, unspecified eye: Secondary | ICD-10-CM | POA: Diagnosis not present

## 2022-12-13 DIAGNOSIS — H524 Presbyopia: Secondary | ICD-10-CM | POA: Diagnosis not present

## 2022-12-13 DIAGNOSIS — H5203 Hypermetropia, bilateral: Secondary | ICD-10-CM | POA: Diagnosis not present

## 2023-01-27 ENCOUNTER — Telehealth: Payer: Self-pay | Admitting: Internal Medicine

## 2023-01-27 DIAGNOSIS — K317 Polyp of stomach and duodenum: Secondary | ICD-10-CM

## 2023-01-27 NOTE — Telephone Encounter (Signed)
Patient needs EGD at the hospital regarding duodenal polyp follow-up.  Please see if she can do this on February 12.  Encounter Diagnosis  Name Primary?   Polyp of duodenum Yes

## 2023-01-27 NOTE — Telephone Encounter (Signed)
Pt made aware of Dr. Carlean Purl recommendations: Pt stated that she cannot do 02/06/2023. Pt was notified that we will keep her on our wait list. Pt verbalized understanding with all questions answered.

## 2023-02-20 ENCOUNTER — Telehealth: Payer: Self-pay | Admitting: Internal Medicine

## 2023-02-20 NOTE — Telephone Encounter (Signed)
Pt was offered 3/14 and 3/28 for the EGD. Pt stated that she would have to talk to her daughter prior to scheduling and would call us back.

## 2023-02-20 NOTE — Telephone Encounter (Signed)
Please offer to do EGD 3/14 or 3/28 at [facility]  Dx duodenal polyp

## 2023-02-21 NOTE — Telephone Encounter (Signed)
Left message for pt to call back  °

## 2023-02-21 NOTE — Telephone Encounter (Signed)
Pt notified of Dr. Carlean Purl recommendations: Pt verbalized understanding with all questions answered.

## 2023-02-21 NOTE — Telephone Encounter (Signed)
Understand - we will hold off on scheduling Ask her to let us know when that test result is in and what she is told

## 2023-02-21 NOTE — Telephone Encounter (Signed)
Pt stated that she is unsure about scheduling an exam as of right now due to the fact that she is awaiting results from her heart monitor that she had to wear that she turned in last week to her PCP at the [place]. Pt stated that she was wearing the Heart Monitor for shortness of breath and lightheadedness.  Please advise

## 2023-03-04 ENCOUNTER — Other Ambulatory Visit: Payer: Self-pay | Admitting: Internal Medicine

## 2023-04-19 ENCOUNTER — Telehealth: Payer: Self-pay

## 2023-04-19 NOTE — Telephone Encounter (Signed)
Called patient to schedule Medicare Annual Wellness Visit (AWV). Left message for patient to call back and schedule Medicare Annual Wellness Visit (AWV).  Last date of AWV: 04/18/22  Please schedule an appointment at any time On Annual Wellness Visit Schedule.    

## 2023-04-30 ENCOUNTER — Other Ambulatory Visit: Payer: Self-pay | Admitting: Internal Medicine

## 2023-05-01 ENCOUNTER — Ambulatory Visit (INDEPENDENT_AMBULATORY_CARE_PROVIDER_SITE_OTHER): Payer: Medicare HMO | Admitting: Internal Medicine

## 2023-05-01 ENCOUNTER — Encounter: Payer: Self-pay | Admitting: Internal Medicine

## 2023-05-01 VITALS — BP 120/80 | HR 76 | Temp 98.3°F | Ht 60.0 in | Wt 216.0 lb

## 2023-05-01 DIAGNOSIS — K219 Gastro-esophageal reflux disease without esophagitis: Secondary | ICD-10-CM | POA: Diagnosis not present

## 2023-05-01 DIAGNOSIS — R7303 Prediabetes: Secondary | ICD-10-CM

## 2023-05-01 DIAGNOSIS — I10 Essential (primary) hypertension: Secondary | ICD-10-CM | POA: Diagnosis not present

## 2023-05-01 DIAGNOSIS — E210 Primary hyperparathyroidism: Secondary | ICD-10-CM

## 2023-05-01 DIAGNOSIS — Z0000 Encounter for general adult medical examination without abnormal findings: Secondary | ICD-10-CM

## 2023-05-01 DIAGNOSIS — E785 Hyperlipidemia, unspecified: Secondary | ICD-10-CM | POA: Diagnosis not present

## 2023-05-01 LAB — COMPREHENSIVE METABOLIC PANEL
ALT: 16 U/L (ref 0–35)
AST: 16 U/L (ref 0–37)
Albumin: 4.4 g/dL (ref 3.5–5.2)
Alkaline Phosphatase: 92 U/L (ref 39–117)
BUN: 18 mg/dL (ref 6–23)
CO2: 27 mEq/L (ref 19–32)
Calcium: 10.6 mg/dL — ABNORMAL HIGH (ref 8.4–10.5)
Chloride: 103 mEq/L (ref 96–112)
Creatinine, Ser: 0.96 mg/dL (ref 0.40–1.20)
GFR: 59.64 mL/min — ABNORMAL LOW (ref >60.00)
Glucose, Bld: 76 mg/dL (ref 70–99)
Potassium: 4.1 mEq/L (ref 3.5–5.1)
Sodium: 139 mEq/L (ref 135–145)
Total Bilirubin: 0.4 mg/dL (ref 0.2–1.2)
Total Protein: 8 g/dL (ref 6.0–8.3)

## 2023-05-01 LAB — HEMOGLOBIN A1C: Hgb A1c MFr Bld: 6.3 % (ref 4.6–6.5)

## 2023-05-01 LAB — LIPID PANEL
Cholesterol: 234 mg/dL — ABNORMAL HIGH (ref 0–200)
HDL: 60.8 mg/dL (ref >39.00)
LDL Cholesterol: 145 mg/dL — ABNORMAL HIGH (ref 0–99)
NonHDL: 173.62
Total CHOL/HDL Ratio: 4
Triglycerides: 145 mg/dL (ref 0.0–149.0)
VLDL: 29 mg/dL (ref 0.0–40.0)

## 2023-05-01 LAB — CBC
HCT: 34.9 % — ABNORMAL LOW (ref 36.0–46.0)
Hemoglobin: 11.3 g/dL — ABNORMAL LOW (ref 12.0–15.0)
MCHC: 32.5 g/dL (ref 30.0–36.0)
MCV: 81.2 fl (ref 78.0–100.0)
Platelets: 320 10*3/uL (ref 150.0–400.0)
RBC: 4.3 Mil/uL (ref 3.87–5.11)
RDW: 13.6 % (ref 11.5–15.5)
WBC: 7 10*3/uL (ref 4.0–10.5)

## 2023-05-01 MED ORDER — SCOPOLAMINE 1 MG/3DAYS TD PT72: 1.0000 | MEDICATED_PATCH | TRANSDERMAL | 0 refills | Status: DC

## 2023-05-01 MED ORDER — PANTOPRAZOLE SODIUM 40 MG PO TBEC: 40.0000 mg | DELAYED_RELEASE_TABLET | Freq: Every day | ORAL | 3 refills | Status: DC

## 2023-05-01 MED ORDER — LOSARTAN POTASSIUM 50 MG PO TABS: 50.0000 mg | ORAL_TABLET | Freq: Every day | ORAL | 3 refills | Status: DC

## 2023-05-01 MED ORDER — LOSARTAN POTASSIUM-HCTZ 50-12.5 MG PO TABS: 1.0000 | ORAL_TABLET | Freq: Every day | ORAL | 3 refills | Status: DC

## 2023-05-01 NOTE — Patient Instructions (Addendum)
The patch last 3 days and you place behind the ear. Switch ears with each patch. The main side effect is dry mouth which generally is mild. It can be bad enough that people remove the patch. Make sure to wash hands well after applying or removing patch as the medicine can be harmful if it accidentally gets into the eye.    Think about getting the tetanus booster and the shingles vaccine at the pharmacy.

## 2023-05-01 NOTE — Progress Notes (Unsigned)
Subjective:   Patient ID: Sarah Santos, female    DOB: 1951-10-08, 72 y.o.   MRN: [id number]  HPI Here for medicare wellness and physical, no new complaints. Please see A/P for status and treatment of chronic medical problems.   Diet: heart healthy Physical activity: 3-4 days per week exercise Depression/mood screen: negative Hearing: intact to whispered voice, intermittent tinnitus Visual acuity: grossly normal with lens, performs annual eye exam  ADLs: capable Fall risk: none Home safety: good Cognitive evaluation: intact to orientation, naming, recall and repetition EOL planning: adv directives discussed  Flowsheet Row Office Visit from 05/01/2023 in [facility] [place]  PHQ-2 Total Score 0       Flowsheet Row Office Visit from 05/01/2023 in [facility] [place]  PHQ-9 Total Score 0         04/13/2021   12:54 PM 04/18/2022    9:11 AM 04/18/2022    9:17 AM 04/26/2022    8:46 AM 05/01/2023    9:00 AM  Fall Risk  Falls in the past year? 0 0 0 0 0  Was there an injury with Fall? 0 0 0 0 0  Fall Risk Category Calculator 0 0 0 0 0  Fall Risk Category (Retired) Low Low Low Low   (RETIRED) Patient Fall Risk Level Low fall risk Low fall risk Low fall risk    Fall risk Follow up  Falls evaluation completed Falls evaluation completed  Falls evaluation completed    I have personally reviewed and have noted 1. The patient's medical and social history - reviewed today no changes 2. Their use of alcohol, tobacco or illicit drugs 3. Their current medications and supplements 4. The patient's functional ability including ADL's, fall risks, home safety risks and hearing or visual impairment. 5. Diet and physical activities 6. Evidence for depression or mood disorders 7. Care team reviewed and updated 8.  The patient is not on an opioid pain medication.  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   GERD    HEADACHE, CHRONIC    Heart murmur    Hematuria, microscopic    History of anemia    Hx of adenomatous colonic polyps    Hypercalcemia    HYPERLIPIDEMIA    Hypertension    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   Menopausal symptom 05/26/2018   Microscopic hematuria 07/02/2009   Qualifier: Diagnosis of  By: Felicity Coyer MD, Vikki Ports A    OSA on CPAP    Personal history of colonic polyps 01/04/2008   Plantar fasciitis    Primary hyperparathyroidism ([facility])    Routine general medical examination at a health care facility 06/13/2015   Past Surgical History:  Procedure Laterality Date   carpal tunnel Bilateral    COLONOSCOPY W/ POLYPECTOMY  2009, 08/18/11   2009:adenomas, largest 4 cm, rectal carcinoid,  hemorrhoids   OVARIAN CYST REMOVAL  70's   TUBAL LIGATION     Family History  Problem Relation Age of Onset   Pulmonary embolism Mother    Hypertension Mother    Stroke Father    Diabetes Father    Heart failure Father    Heart disease Father    Colon polyps Sister    Other Brother        prediabetes   Hyperlipidemia Brother    Heart disease Brother    Colon polyps Brother    Diabetes Paternal Grandmother    Stomach cancer Paternal Aunt    Cancer Paternal Aunt        Lung   Hyperlipidemia Daughter    Other Daughter        prediabetes   Colon cancer Neg Hx    Esophageal cancer  Neg Hx    Rectal cancer Neg Hx    Review of Systems  Constitutional: Negative.   HENT: Negative.    Eyes: Negative.   Respiratory:  Negative for cough, chest tightness and shortness of breath.   Cardiovascular:  Negative for chest pain, palpitations and leg swelling.  Gastrointestinal:  Negative for abdominal distention, abdominal pain, constipation, diarrhea, nausea and vomiting.  Musculoskeletal: Negative.   Skin: Negative.   Neurological: Negative.   Psychiatric/Behavioral: Negative.      Objective:  Physical Exam Constitutional:      Appearance: She is well-developed.  HENT:     Head: Normocephalic and atraumatic.  Cardiovascular:     Rate and Rhythm: Normal rate and regular rhythm.  Pulmonary:     Effort: Pulmonary effort is normal. No respiratory distress.     Breath sounds: Normal breath sounds. No wheezing or rales.  Abdominal:     General: Bowel sounds are normal. There is no distension.     Palpations: Abdomen is soft.     Tenderness: There is no abdominal tenderness. There is no rebound.  Musculoskeletal:     Cervical back: Normal range of motion.  Skin:    General: Skin is warm and dry.  Neurological:     Mental Status: She is alert and oriented to person, place, and time.     Coordination: Coordination normal.     Vitals:   05/01/23 0900  BP: 120/80  Pulse: 76  Temp: 98.3 F (36.8 C)  TempSrc: Oral  SpO2: 99%  Weight: 216 lb (98 kg)  Height: 5' (1.524 m)    Assessment & Plan:

## 2023-05-03 DIAGNOSIS — R7303 Prediabetes: Secondary | ICD-10-CM | POA: Insufficient documentation

## 2023-05-03 NOTE — Assessment & Plan Note (Signed)
Checking CMP and adjust as needed. No prior bone loss.

## 2023-05-03 NOTE — Assessment & Plan Note (Signed)
Counseled about weight loss and diet/exercise to help.

## 2023-05-03 NOTE — Assessment & Plan Note (Signed)
Taking protonix 40 mg daily and well controlled. Refilled and will continue.

## 2023-05-03 NOTE — Assessment & Plan Note (Signed)
Checking HgA1c and adjust as needed.  

## 2023-05-03 NOTE — Assessment & Plan Note (Signed)
Taking simvastatin 20 mg twice a week. Agrees to 3 times a week if levels high. Checking lipid panel today.

## 2023-05-03 NOTE — Assessment & Plan Note (Signed)
Flu shot yearly. Pneumonia complete. Shingrix due at pharmacy. Tetanus due at pharmacy. Colonoscopy due 2027. Mammogram due 2025, pap smear aged out and dexa  complete. Counseled about sun safety and mole surveillance. Counseled about the dangers of distracted driving. Given 10 year screening recommendations.

## 2023-05-03 NOTE — Assessment & Plan Note (Signed)
BP at goal on losartan 50 mg daily and losartan/hctz 50/12.5 mg daily. Checking CMP and adjust as needed.

## 2023-08-22 ENCOUNTER — Telehealth: Payer: Self-pay

## 2023-08-22 NOTE — Telephone Encounter (Signed)
Pt was contacted in regard to scheduling pt for her Upper Endoscopy on 08/29/2023. Pt stated that he will be out of town that date. Pt was notified that we will keep her on the wait list.  Pt verbalized understanding with all questions answered.

## 2023-09-14 ENCOUNTER — Telehealth: Payer: Self-pay | Admitting: Internal Medicine

## 2023-09-14 DIAGNOSIS — K317 Polyp of stomach and duodenum: Secondary | ICD-10-CM

## 2023-09-14 NOTE — Telephone Encounter (Signed)
Please have her set up an office visit w/ me to review her situation

## 2023-09-14 NOTE — Telephone Encounter (Signed)
Scheduled OV with patient for 01/02/23 at 11:10 am with Dr. Leone Payor.

## 2023-09-14 NOTE — Telephone Encounter (Signed)
Offered date to patient & she states she will be out of town.

## 2023-09-14 NOTE — Telephone Encounter (Signed)
Patient is on wait list to do EGD  Please see if she can do 9/24 at [facility] - I have had cancellations  Encounter Diagnosis  Name Primary?   Polyp of duodenum Yes

## 2023-09-18 ENCOUNTER — Telehealth: Payer: Self-pay | Admitting: Internal Medicine

## 2023-09-18 DIAGNOSIS — K317 Polyp of stomach and duodenum: Secondary | ICD-10-CM

## 2023-09-18 NOTE — Telephone Encounter (Signed)
Left message for pt to call back  °

## 2023-09-18 NOTE — Telephone Encounter (Signed)
I had Kaira set up an OV w/ me as she has been unable to make EGD appt at [facility] due to being out of town  I was looking at list and openings - please see if she can make Nov or Dec and set up for EGd  Encounter Diagnosis  Name Primary?   Polyp of duodenum Yes

## 2023-09-19 NOTE — Telephone Encounter (Signed)
Pt was made aware of Dr. Leone Payor recommendations and dates were provided for pt to set up procedure. Pt stated that she already has an office visit scheduled with Dr. Leone Payor in January  and that she will wait till after that to be  scheduled.  Routed at [facility]

## 2023-11-01 ENCOUNTER — Other Ambulatory Visit: Payer: Self-pay | Admitting: Internal Medicine

## 2023-12-18 IMAGING — MG MM DIGITAL SCREENING BILAT W/ TOMO AND CAD
8 series · 8 of 24 positions shown · non-contrast
Comparison: None Available.
COMPARISON: None Available.

Addendum:
CLINICAL DATA: Screening.

EXAM:
DIGITAL SCREENING BILATERAL MAMMOGRAM WITH TOMOSYNTHESIS AND CAD
TECHNIQUE: Bilateral screening digital craniocaudal and mediolateral oblique
mammograms were obtained. Bilateral screening digital breast
tomosynthesis was performed. The images were evaluated with
computer-aided detection.

[R CC synth-2D]
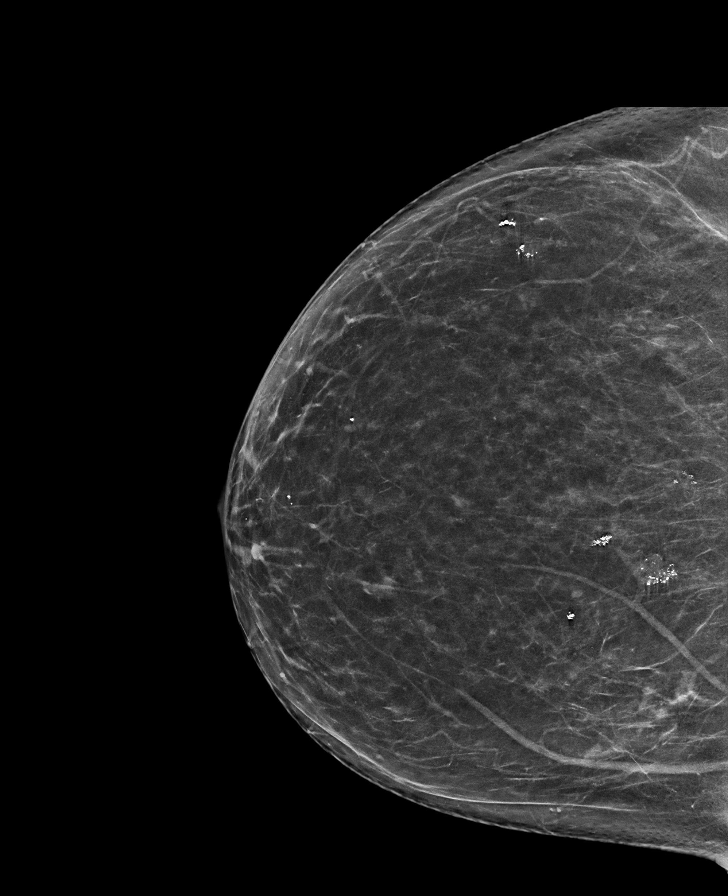

[L CC synth-2D]
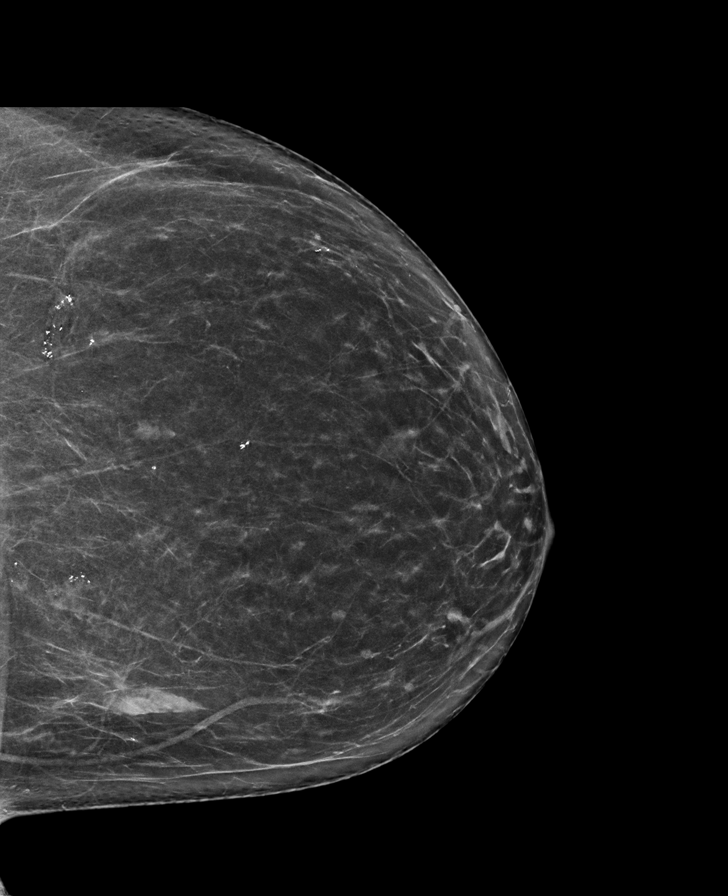

[L MLO synth-2D]
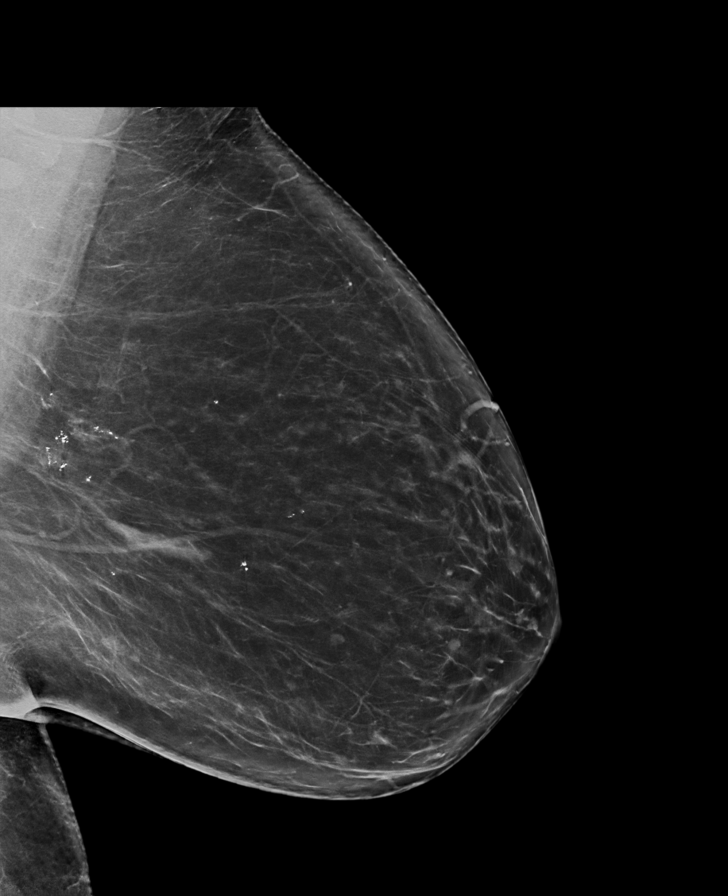

[R MLO synth-2D]
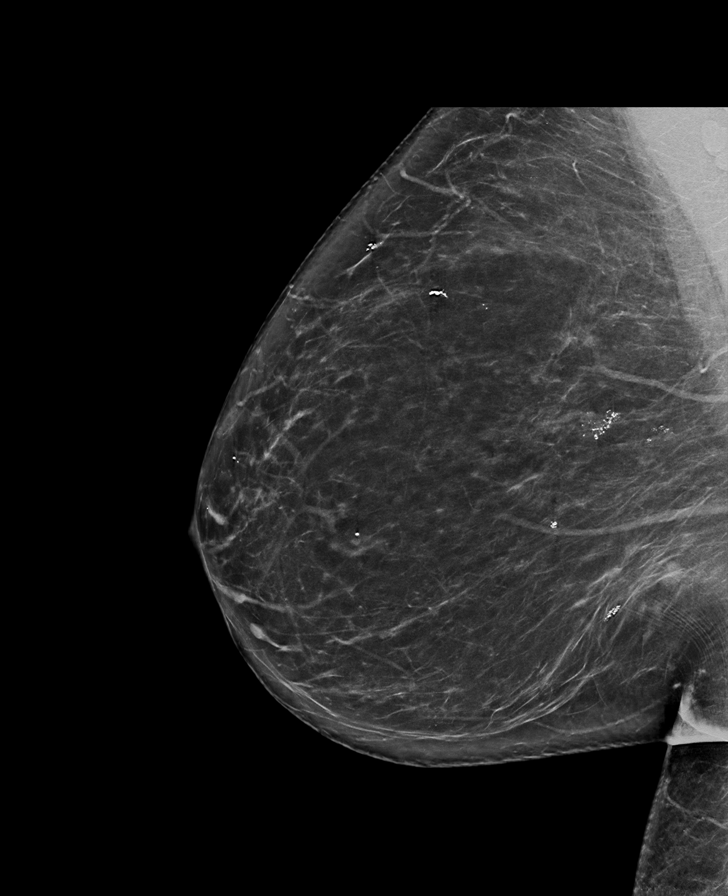

[L MLO tomo · tomo slice 43/85.0]
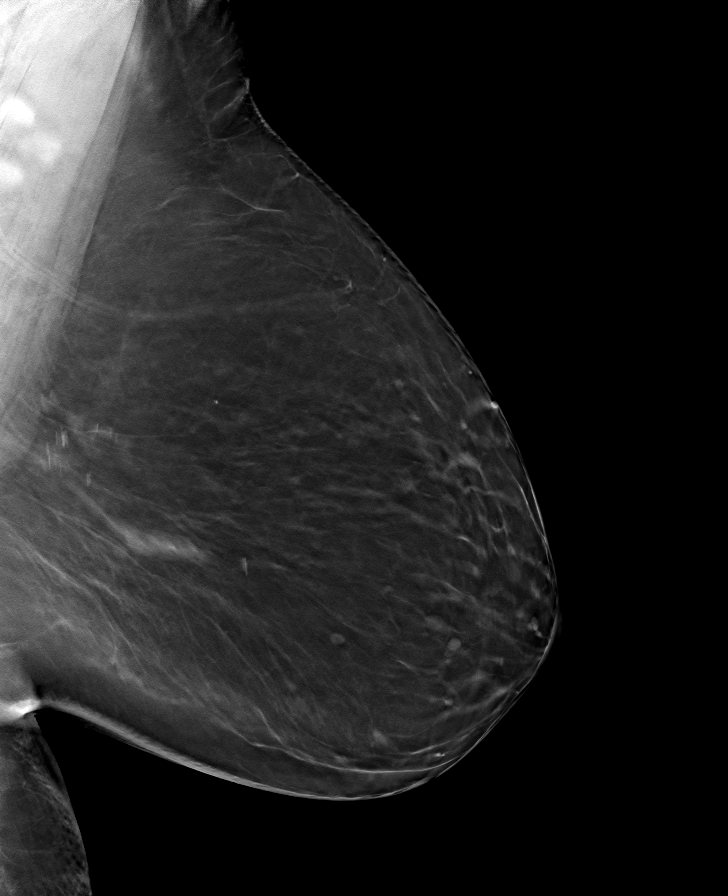

[L CC tomo · tomo slice 37/72.0]
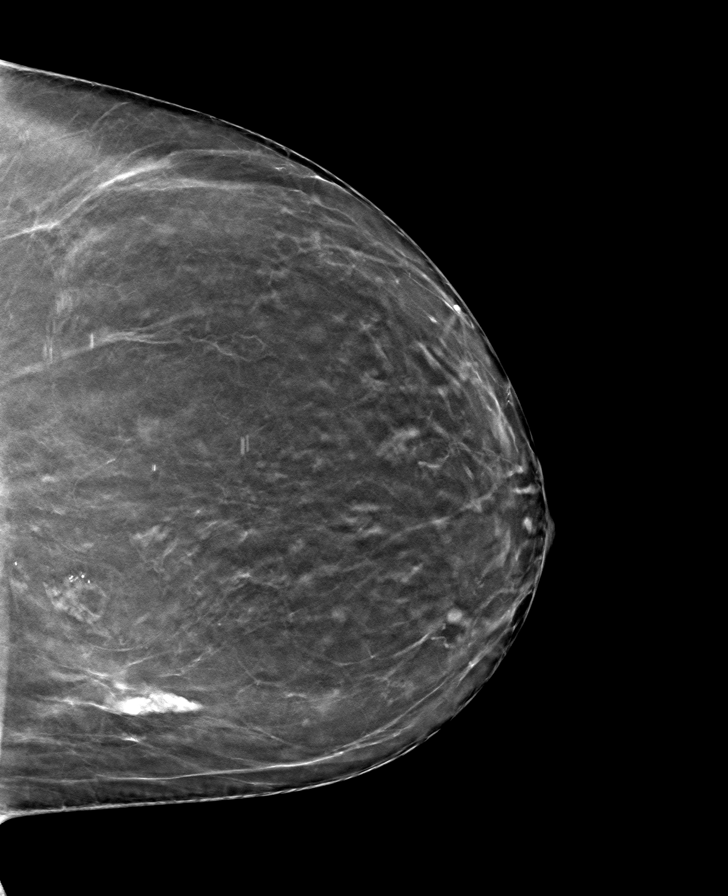

[R CC tomo · tomo slice 35/70.0]
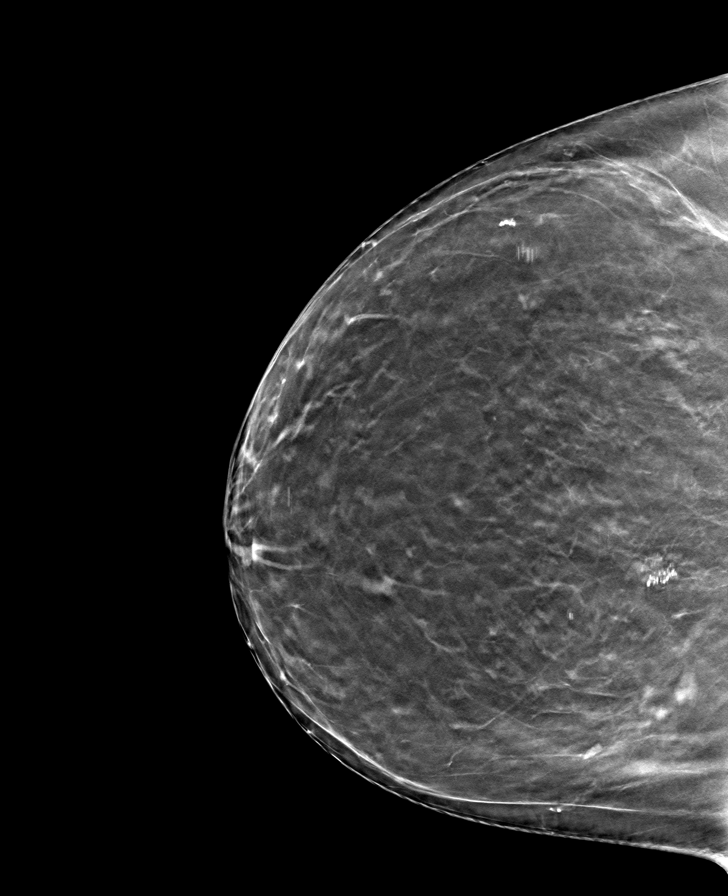

[R MLO tomo · tomo slice 42/83.0]
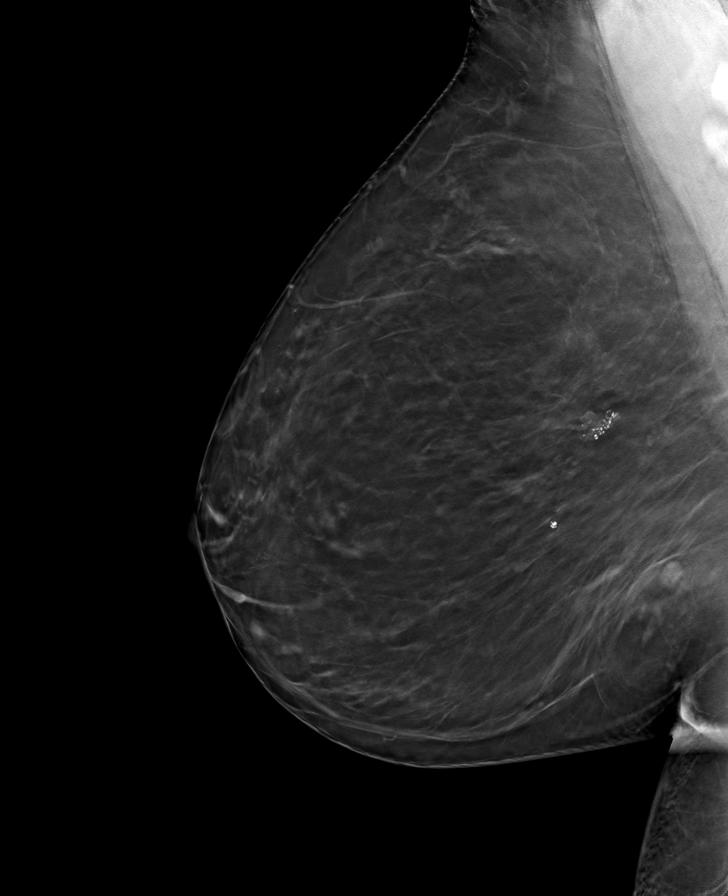

[8 of 24 positions shown; findings below may reference images not displayed]

ACR Breast Density Category b: There are scattered areas of
fibroglandular density.
FINDINGS: There are no findings suspicious for malignancy.
IMPRESSION: No mammographic evidence of malignancy. A result letter of this
screening mammogram will be mailed directly to the patient.

RECOMMENDATION:
Screening mammogram in one year. (Code:2X-S-IYE)

BI-RADS CATEGORY  1: Negative.

ADDENDUM:
Under COMPARISONS, it should state: Previous exams.

*** End of Addendum ***
ACR Breast Density Category b: There are scattered areas of
fibroglandular density.
FINDINGS: There are no findings suspicious for malignancy.
IMPRESSION: No mammographic evidence of malignancy. A result letter of this
screening mammogram will be mailed directly to the patient.

RECOMMENDATION:
Screening mammogram in one year. (Code:2X-S-IYE)

BI-RADS CATEGORY  1: Negative.

## 2024-01-03 ENCOUNTER — Ambulatory Visit: Payer: Medicare HMO | Admitting: Internal Medicine

## 2024-01-03 ENCOUNTER — Encounter: Payer: Self-pay | Admitting: Internal Medicine

## 2024-01-03 VITALS — BP 112/70 | HR 94 | Ht 60.0 in | Wt 210.5 lb

## 2024-01-03 DIAGNOSIS — R1013 Epigastric pain: Secondary | ICD-10-CM | POA: Diagnosis not present

## 2024-01-03 DIAGNOSIS — D132 Benign neoplasm of duodenum: Secondary | ICD-10-CM | POA: Diagnosis not present

## 2024-01-03 NOTE — Patient Instructions (Addendum)
 You have been scheduled for an endoscopy. Please follow written instructions given to you at your visit today.  If you use inhalers (even only as needed), please bring them with you on the day of your procedure.  If you take any of the following medications, they will need to be adjusted prior to your procedure:   DO NOT TAKE 7 DAYS PRIOR TO TEST- Trulicity (dulaglutide) Ozempic, Wegovy (semaglutide) Mounjaro (tirzepatide) Bydureon Bcise (exanatide extended release)  DO NOT TAKE 1 DAY PRIOR TO YOUR TEST Rybelsus (semaglutide) Adlyxin (lixisenatide) Victoza (liraglutide) Byetta (exanatide) ___________________________________________________________________________    Continue to avoid food's that bother you. And you may use Tums but try to use rarely as needed.  _______________________________________________________  If your blood pressure at your visit was 140/90 or greater, please contact your primary care physician to follow up on this.  _______________________________________________________  If you are age 67 or older, your body mass index should be between 23-30. Your Body mass index is 41.11 kg/m. If this is out of the aforementioned range listed, please consider follow up with your Primary Care Provider.  If you are age 33 or younger, your body mass index should be between 19-25. Your Body mass index is 41.11 kg/m. If this is out of the aformentioned range listed, please consider follow up with your Primary Care Provider.   ________________________________________________________  The [facility] providers would like to encourage you to use MYCHART to communicate with providers for non-urgent requests or questions.  Due to long hold times on the telephone, sending your provider a message by [facility] may be a faster and more efficient way to get a response.  Please allow 48 business hours for a response.  Please remember that this is for non-urgent requests.   _______________________________________________________  I appreciate the opportunity to care for you. Lupita Commander, MD, [facility]

## 2024-01-03 NOTE — Progress Notes (Signed)
 Sarah Santos 73 y.o. 05-Oct-1951 [id number]  Assessment & Plan:   Encounter Diagnoses  Name Primary?   Duodenal adenoma Yes   Dyspepsia    Schedule EGD to assess for removal of duodenal adenoma.  Procedure planned for hospital due to tolerance of procedure in the [facility] in 2022.  She will take the list of available dates and talk to her daughter who is her driver and call us  back.  She may continue rare use of Tums, caution advised due to mild hypercalcemia with hyperparathyroidism.  She is to continue to examine diet to see which foods correlate with dyspepsia/indigestion and to try to avoid those.    Subjective:   Chief Complaint: History of duodenal polyp and abdominal pain  HPI 73 year old woman with a history of duodenal adenoma, hyperparathyroidism, adenomatous colon polyps, obstructive sleep apnea on CPAP, and a remote rectal carcinoid.  She is here for follow-up with the following context:  The patient had been seen in November 2022 by Greig Corti PA-C because of bright red blood mixed in with the bowel movements some softer stools fullness of upper abdominal discomfort that occurred postprandial as well as intermittent migratory abdominal discomfort so she had an EGD and colonoscopy as below:  EGD 12/08/2021 - Normal esophagus. - Normal stomach. - A single duodenal polyp.  6 mm resected and retrieved. - The examination was otherwise normal.  The polyp was an adenoma   Colonoscopy 12/08/2021 - One 7 mm polyp in the distal sigmoid colon, removed with a hot snare. Resected and retrieved.  Tubular adenoma- Internal hemorrhoids. - The examination was otherwise normal on direct and retroflexion views. - Personal history of colonic polyps. 4 cm adenoma + other polyps 2009, 5 mm adenoma [id number] and a hx of small rectal carcinoid 2009   She had gagging with the upper endoscopy and also had a vagal episode with the colonoscopy so I had determined future procedures would  be best performed at the hospital repeat EGD was recommended 1 year after the last 1 but we have not accomplished that yet despite scheduling it at times.  She is here to discuss.  We reviewed that situation and how she needs a follow-up to ensure complete removal of the adenoma.  She indicates that once or twice a month she has to take a Tums at bedtime because of some upper abdominal periumbilical heartburn-like pain.  She has started drinking decaf coffee in the mornings and wonders if that is related but this is an intermittent phenomenon.  She noticed foods with pasta seem to make this worse.  She takes a Tums at bedtime (only 1) she will be fine in the morning.  She does have hyperparathyroidism, and [facility] off of losartan  and HCTZ but she has not arranged that yet.  No strong history of kidney stones.  A sister has smoldering myeloma she reports.  Labs last done in May 2024 with a stable slightly low hemoglobin at 11.3 otherwise normal CBC A1c 6.3 calcium slightly high at 10.6 GFR 59 otherwise normal CMET  No Known Allergies Current Meds  Medication Sig   acetaminophen (TYLENOL) 500 MG tablet Take 500 mg by mouth every 6 (six) hours as needed.   Glucosamine HCl (GLUCOSAMINE PO) Take by mouth.   loratadine  (CLARITIN ) 10 MG tablet Take 10 mg by mouth daily as needed for allergies.   losartan  (COZAAR ) 50 MG tablet Take 1 tablet (50 mg total) by mouth daily.  losartan -hydrochlorothiazide  (HYZAAR) 50-12.5 MG tablet Take 1 tablet by mouth daily.   Multiple Vitamin (MULTIVITAMIN PO) Take by mouth.   nystatin -triamcinolone  (MYCOLOG II) cream Apply 1 application topically daily as needed.   pantoprazole  (PROTONIX ) 40 MG tablet Take 1 tablet (40 mg total) by mouth daily.   simvastatin  (ZOCOR ) 20 MG tablet TAKE 1 TABLET TWICE WEEKLY   Turmeric (QC TUMERIC COMPLEX PO) Take by mouth.   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   Menopausal symptom 05/26/2018   Microscopic hematuria 07/02/2009   Qualifier: Diagnosis of  By: Inocencio MD, Berwyn A    OSA on CPAP    Personal history of colonic polyps 01/04/2008   Plantar fasciitis    Primary hyperparathyroidism (HCC)    Routine general medical examination at a health care facility 06/13/2015   Past Surgical History:  Procedure Laterality Date   carpal tunnel Bilateral    COLONOSCOPY W/ POLYPECTOMY  2009, 08/18/11   2009:adenomas, largest 4 cm, rectal carcinoid, hemorrhoids   OVARIAN CYST REMOVAL  70's   TUBAL LIGATION     Social History   Social History Narrative   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   Review of Systems As per HPI  Objective:   Physical Exam BP 112/70 (BP Location: Left Arm, Patient Position: Sitting, Cuff Size: Large)   Pulse 94   Ht 5' (1.524 m)   Wt 210 lb 8 oz (95.5 kg)   SpO2 98%   BMI 41.11 kg/m  Obese black woman in no acute distress Lungs clear Heart sounds normal  S1-S2 no rubs murmurs or gallops Abdomen is soft obese bowel sounds present nontender no mass

## 2024-01-17 ENCOUNTER — Ambulatory Visit (INDEPENDENT_AMBULATORY_CARE_PROVIDER_SITE_OTHER): Payer: Medicare HMO | Admitting: Obstetrics and Gynecology

## 2024-01-17 ENCOUNTER — Other Ambulatory Visit (HOSPITAL_COMMUNITY)
Admission: RE | Admit: 2024-01-17 | Discharge: 2024-01-17 | Disposition: A | Discharge status: Home / Self Care | Payer: Medicare HMO | Source: Ambulatory Visit | Attending: Obstetrics and Gynecology | Admitting: Obstetrics and Gynecology

## 2024-01-17 ENCOUNTER — Encounter: Payer: Self-pay | Admitting: Obstetrics and Gynecology

## 2024-01-17 VITALS — BP 124/62 | HR 80 | Temp 98.1°F | Ht 61.42 in | Wt 211.6 lb

## 2024-01-17 DIAGNOSIS — Z9189 Other specified personal risk factors, not elsewhere classified: Secondary | ICD-10-CM | POA: Diagnosis not present

## 2024-01-17 DIAGNOSIS — N905 Atrophy of vulva: Secondary | ICD-10-CM | POA: Insufficient documentation

## 2024-01-17 DIAGNOSIS — Z124 Encounter for screening for malignant neoplasm of cervix: Secondary | ICD-10-CM

## 2024-01-17 DIAGNOSIS — M8580 Other specified disorders of bone density and structure, unspecified site: Secondary | ICD-10-CM

## 2024-01-17 DIAGNOSIS — Z8619 Personal history of other infectious and parasitic diseases: Secondary | ICD-10-CM

## 2024-01-17 DIAGNOSIS — Z780 Asymptomatic menopausal state: Secondary | ICD-10-CM | POA: Insufficient documentation

## 2024-01-17 DIAGNOSIS — Z01419 Encounter for gynecological examination (general) (routine) without abnormal findings: Secondary | ICD-10-CM | POA: Insufficient documentation

## 2024-01-17 NOTE — Patient Instructions (Signed)
For patients under 50-72yo, I recommend 1200mg  calcium daily and 600IU of vitamin D daily. For patients over 72yo, I recommend 1200mg  calcium daily and 800IU of vitamin D daily.  Health Maintenance, Female Adopting a healthy lifestyle and getting preventive care are important in promoting health and wellness. Ask your health care provider about: The right schedule for you to have regular tests and exams. Things you can do on your own to prevent diseases and keep yourself healthy. What should I know about diet, weight, and exercise? Eat a healthy diet  Eat a diet that includes plenty of vegetables, fruits, low-fat dairy products, and lean protein. Do not eat a lot of foods that are high in solid fats, added sugars, or sodium. Maintain a healthy weight Body mass index (BMI) is used to identify weight problems. It estimates body fat based on height and weight. Your health care provider can help determine your BMI and help you achieve or maintain a healthy weight. Get regular exercise Get regular exercise. This is one of the most important things you can do for your health. Most adults should: Exercise for at least 150 minutes each week. The exercise should increase your heart rate and make you sweat (moderate-intensity exercise). Do strengthening exercises at least twice a week. This is in addition to the moderate-intensity exercise. Spend less time sitting. Even light physical activity can be beneficial. Watch cholesterol and blood lipids Have your blood tested for lipids and cholesterol at 72 years of age, then have this test every 5 years. Have your cholesterol levels checked more often if: Your lipid or cholesterol levels are high. You are older than 73 years of age. You are at high risk for heart disease. What should I know about cancer screening? Depending on your health history and family history, you may need to have cancer screening at various ages. This may include screening  for: Breast cancer. Cervical cancer. Colorectal cancer. Skin cancer. Lung cancer. What should I know about heart disease, diabetes, and high blood pressure? Blood pressure and heart disease High blood pressure causes heart disease and increases the risk of stroke. This is more likely to develop in people who have high blood pressure readings or are overweight. Have your blood pressure checked: Every 3-5 years if you are 83-72 years of age. Every year if you are 4 years old or older. Diabetes Have regular diabetes screenings. This checks your fasting blood sugar level. Have the screening done: Once every three years after age 68 if you are at a normal weight and have a low risk for diabetes. More often and at a younger age if you are overweight or have a high risk for diabetes. What should I know about preventing infection? Hepatitis B If you have a higher risk for hepatitis B, you should be screened for this virus. Talk with your health care provider to find out if you are at risk for hepatitis B infection. Hepatitis C Testing is recommended for: Everyone born from [birth year] through 1964. Anyone with known risk factors for hepatitis C. Sexually transmitted infections (STIs) Get screened for STIs, including gonorrhea and chlamydia, if: You are sexually active and are younger than 72 years of age. You are older than 72 years of age and your health care provider tells you that you are at risk for this type of infection. Your sexual activity has changed since you were last screened, and you are at increased risk for chlamydia or gonorrhea. Ask your health care provider if  you are at risk. Ask your health care provider about whether you are at high risk for HIV. Your health care provider may recommend a prescription medicine to help prevent HIV infection. If you choose to take medicine to prevent HIV, you should first get tested for HIV. You should then be tested every 3 months for as long as you  are taking the medicine. Osteoporosis and menopause Osteoporosis is a disease in which the bones lose minerals and strength with aging. This can result in bone fractures. If you are 44 years old or older, or if you are at risk for osteoporosis and fractures, ask your health care provider if you should: Be screened for bone loss. Take a calcium or vitamin D supplement to lower your risk of fractures. Be given hormone replacement therapy (HRT) to treat symptoms of menopause. Follow these instructions at home: Alcohol use Do not drink alcohol if: Your health care provider tells you not to drink. You are pregnant, may be pregnant, or are planning to become pregnant. If you drink alcohol: Limit how much you have to: 0-1 drink a day. Know how much alcohol is in your drink. In the [place] one drink equals one 12 oz bottle of beer (355 mL), one 5 oz glass of wine (148 mL), or one 1 oz glass of hard liquor (44 mL). Lifestyle Do not use any products that contain nicotine or tobacco. These products include cigarettes, chewing tobacco, and vaping devices, such as e-cigarettes. If you need help quitting, ask your health care provider. Do not use street drugs. Do not share needles. Ask your health care provider for help if you need support or information about quitting drugs. General instructions Schedule regular health, dental, and eye exams. Stay current with your vaccines. Tell your health care provider if: You often feel depressed. You have ever been abused or do not feel safe at home. Summary Adopting a healthy lifestyle and getting preventive care are important in promoting health and wellness. Follow your health care provider's instructions about healthy diet, exercising, and getting tested or screened for diseases. Follow your health care provider's instructions on monitoring your cholesterol and blood pressure. This information is not intended to replace advice given to you by your health  care provider. Make sure you discuss any questions you have with your health care provider. Document Revised: 05/03/2021 Document Reviewed: 05/03/2021 Elsevier Patient Education  2024 [employer].

## 2024-01-17 NOTE — Assessment & Plan Note (Addendum)
Mild, chronic LLQ pain. Normal exam today without tenderness or distension. Recommend continued conservative management. Return for worsening symptoms. Cervical cancer screening performed according to ASCCP guidelines. Encouraged annual mammogram screening, patient desires biannual screening Colonoscopy UTD DXA ordered Labs and immunizations with her primary Encouraged safe sexual practices as indicated Encouraged healthy lifestyle practices with diet and exercise For patients over 70yo, I recommend 1200mg  calcium daily and 800IU of vitamin D daily.

## 2024-01-17 NOTE — Progress Notes (Signed)
73 y.o. [id number] postmenopausal female with history of carcinoid colon cancer (2009), osteopenia here for annual exam- high risk medicare exam. Divorced. Retired Warden/ranger.  Reports chronic pelvic pain on left side - comes and goes. Rated 1/10 today, very mild pain but noticing more frequently. Now a few times a month. Notices in the afternoon. Improves with tylenol. Not related to diet or activity. Previously told it may be related to scar tissue. Normal BMs. No urinary issues.  Also noted vulvar itching, using clobetasol cream as needed.   Postmenopausal bleeding: none Pelvic discharge or pain: none Breast mass, nipple discharge or skin changes : none Last PAP: No results found for: "DIAGPAP", "HPVHIGH", "ADEQPAP" Last mammogram: 04/26/2022 BI-RADS 1, density B Last colonoscopy: 12/08/2021 Last DXA: 2018, osteopenia Sexually active: no  Exercising: Yes. Walk, Spin cycle, strength x 5d/wk Smoker: no   GYN HISTORY: No significant history  OB History  Gravida Para Term Preterm AB Living  3 2   1 2   SAB IAB Ectopic Multiple Live Births          # Outcome Date GA Lbr Len/2nd Weight Sex Type Anes PTL Lv  3 AB           2 Para           1 Para             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   GERD    HEADACHE, CHRONIC    Heart murmur    Hematuria, microscopic    History of anemia    Hx of adenomatous colonic polyps    Hypercalcemia    HYPERLIPIDEMIA    Hypertension    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   Menopausal symptom 05/26/2018   Microscopic hematuria 07/02/2009   Qualifier: Diagnosis of   By: Felicity Coyer MD, Vikki Ports A    OSA on CPAP    Personal history of colonic polyps 01/04/2008   Plantar fasciitis    Primary hyperparathyroidism (HCC)    Routine general medical examination at a health care facility 06/13/2015    Past Surgical History:  Procedure Laterality Date   carpal tunnel Bilateral    COLONOSCOPY W/ POLYPECTOMY  2009, 08/18/11   2009:adenomas, largest 4 cm, rectal carcinoid, hemorrhoids   OVARIAN CYST REMOVAL  70's   TUBAL LIGATION      Current Outpatient Medications on File Prior to Visit  Medication Sig Dispense Refill   acetaminophen (TYLENOL) 500 MG tablet Take 500 mg by mouth every 6 (six) hours as needed.     Cholecalciferol 50 MCG (2000 UT) TABS Take 1 tablet by mouth daily.     Glucosamine HCl (GLUCOSAMINE PO) Take by mouth.     loratadine (CLARITIN) 10 MG tablet Take 10 mg by mouth daily as needed for allergies.     losartan (COZAAR) 50 MG tablet Take 1 tablet (50 mg total) by mouth daily. 90 tablet 3   losartan-hydrochlorothiazide (HYZAAR) 50-12.5 MG tablet Take 1 tablet by mouth daily. 90 tablet 3   Multiple Vitamin (MULTIVITAMIN PO) Take by mouth.  nystatin-triamcinolone (MYCOLOG II) cream Apply 1 application topically daily as needed. 30 g 4   pantoprazole (PROTONIX) 40 MG tablet Take 1 tablet (40 mg total) by mouth daily. 90 tablet 3   simvastatin (ZOCOR) 20 MG tablet TAKE 1 TABLET TWICE WEEKLY 24 tablet 3   Turmeric (QC TUMERIC COMPLEX PO) Take by mouth.     No current facility-administered medications on file prior to visit.    Social History   Socioeconomic History   Marital status: Divorced    Spouse name: Not on file   Number of children: 2   Years of education: Not on file   Highest education level: Not on file  Occupational History   Occupation: retired  Tobacco Use   Smoking status: Former    Current packs/day: 0.00    Types: Cigarettes    Quit date: 1980    Years since quitting: 45.0   Smokeless tobacco: Never  Vaping Use    Vaping status: Never Used  Substance and Sexual Activity   Alcohol use: No   Drug use: No   Sexual activity: Not Currently    Birth control/protection: Post-menopausal, Surgical    Comment: BTL, greater than 16, more than 5  Other Topics Concern   Not on file  Social History Narrative   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   Social Drivers of Health   Financial Resource Strain: Low Risk  (04/18/2022)   Overall Financial Resource Strain (CARDIA)    Difficulty of Paying Living Expenses: Not hard at all  Food Insecurity: No Food Insecurity (04/18/2022)   Hunger Vital Sign    Worried About Running Out of Food in the Last Year: Never true    Ran Out of Food in the Last Year: Never true  Transportation Needs: No Transportation Needs (04/18/2022)   PRAPARE - Administrator, Civil Service (Medical): No    Lack of Transportation (Non-Medical): No  Physical Activity: Insufficiently Active (04/18/2022)   Exercise Vital Sign    Days of Exercise per Week: 3 days    Minutes of Exercise per Session: 40 min  Stress: No Stress Concern Present (04/18/2022)   [employer] of Occupational Health - Occupational Stress Questionnaire    Feeling of Stress : Not at all  Social Connections: Moderately Isolated (04/18/2022)   Social Connection and Isolation Panel [NHANES]    Frequency of Communication with Friends and Family: Twice a week    Frequency of Social Gatherings with Friends and Family: Twice a week    Attends Religious Services: More than 4 times per year    Active Member of [employer] or Organizations: No    Attends Banker Meetings: Never    Marital Status: Divorced  Catering manager Violence: Not At Risk (04/18/2022)   Humiliation, Afraid, Rape, and Kick questionnaire    Fear of Current or Ex-Partner: No    Emotionally Abused: No    Physically Abused: No    Sexually Abused: No    Family History  Problem Relation Age of Onset   Pulmonary embolism  Mother    Hypertension Mother    Stroke Father    Diabetes Father    Heart failure Father    Heart disease Father    Colon polyps Sister    Multiple myeloma Sister    Other Brother        prediabetes   Hyperlipidemia Brother    Heart disease Brother    Colon polyps Brother    Stomach  cancer Paternal Aunt    Cancer Paternal Aunt        Lung   Diabetes Paternal Grandmother    Hyperlipidemia Daughter    Other Daughter        prediabetes   Colon cancer Neg Hx    Esophageal cancer Neg Hx    Rectal cancer Neg Hx     No Known Allergies    PE Today's Vitals   01/17/24 1607  BP: 124/62  Pulse: 80  Temp: 98.1 F (36.7 C)  TempSrc: Oral  SpO2: 98%  Weight: 211 lb 9.6 oz (96 kg)  Height: 5' 1.42" (1.56 m)   Body mass index is 39.44 kg/m.  Physical Exam Vitals reviewed. Exam conducted with a chaperone present.  Constitutional:      General: She is not in acute distress.    Appearance: Normal appearance.  HENT:     Head: Normocephalic and atraumatic.     Nose: Nose normal.  Eyes:     Extraocular Movements: Extraocular movements intact.     Conjunctiva/sclera: Conjunctivae normal.  Neck:     Thyroid: No thyroid mass, thyromegaly or thyroid tenderness.  Pulmonary:     Effort: Pulmonary effort is normal.  Chest:     Chest wall: No mass or tenderness.  Breasts:    Right: Normal. No swelling, mass, nipple discharge, skin change or tenderness.     Left: Normal. No swelling, mass, nipple discharge, skin change or tenderness.  Abdominal:     General: There is no distension.     Palpations: Abdomen is soft.     Tenderness: There is no abdominal tenderness.  Genitourinary:    General: Normal vulva.     Exam position: Lithotomy position.     Urethra: No prolapse.     Vagina: Normal. No vaginal discharge or bleeding.     Cervix: Normal. No lesion.     Uterus: Normal. Not enlarged and not tender.      Adnexa: Right adnexa normal and left adnexa normal.   Musculoskeletal:        General: Normal range of motion.     Cervical back: Normal range of motion.  Lymphadenopathy:     Upper Body:     Right upper body: No axillary adenopathy.     Left upper body: No axillary adenopathy.     Lower Body: No right inguinal adenopathy. No left inguinal adenopathy.  Skin:    General: Skin is warm and dry.  Neurological:     General: No focal deficit present.     Mental Status: She is alert.  Psychiatric:        Mood and Affect: Mood normal.        Behavior: Behavior normal.       Assessment and Plan:        Well woman exam with routine gynecological exam Assessment & Plan: Mild, chronic LLQ pain. Normal exam today without tenderness or distension. Recommend continued conservative management. Return for worsening symptoms. Cervical cancer screening performed according to ASCCP guidelines. Encouraged annual mammogram screening, patient desires biannual screening Colonoscopy UTD DXA ordered Labs and immunizations with her primary Encouraged safe sexual practices as indicated Encouraged healthy lifestyle practices with diet and exercise For patients over 70yo, I recommend 1200mg  calcium daily and 800IU of vitamin D daily.    Osteopenia after menopause Assessment & Plan: Continue vitamin D+Calcium Encouraged weight based exercise DXA due    Orders: -     DG Bone Density; Future  Cervical cancer screening -     Cytology - PAP  Vulvar atrophy Assessment & Plan: Dryness of vulva noted Considering applying aquaphor or coconut oil to the vulva after showers. You could also using daily vaginal moisturizers by brands like Good Clean Love and Ah! Yes. No indication for continued topical steroids.    Rosalyn Gess, MD

## 2024-01-17 NOTE — Assessment & Plan Note (Signed)
 Continue vitamin D+Calcium Encouraged weight based exercise DXA due

## 2024-01-17 NOTE — Assessment & Plan Note (Signed)
Dryness of vulva noted Considering applying aquaphor or coconut oil to the vulva after showers. You could also using daily vaginal moisturizers by brands like Good Clean Love and Ah! Yes. No indication for continued topical steroids.

## 2024-01-23 ENCOUNTER — Encounter: Payer: Self-pay | Admitting: Obstetrics and Gynecology

## 2024-01-23 LAB — CYTOLOGY - PAP
Adequacy: ABSENT
Diagnosis: NEGATIVE

## 2024-02-17 ENCOUNTER — Other Ambulatory Visit: Payer: Self-pay | Admitting: Internal Medicine

## 2024-02-19 ENCOUNTER — Telehealth: Payer: Self-pay | Admitting: Gastroenterology

## 2024-02-19 NOTE — Telephone Encounter (Signed)
 Procedure:EGD Procedure date: 02/29/24 Procedure location: [facility] Arrival Time: 6:00 am Spoke with the patient Y/N: Yes Any prep concerns? No  Has the patient obtained the prep from the pharmacy ? No prep neded Do you have a care partner and transportation: Yes Any additional concerns? No

## 2024-02-22 ENCOUNTER — Encounter (HOSPITAL_COMMUNITY): Payer: Self-pay | Admitting: Internal Medicine

## 2024-02-22 NOTE — Progress Notes (Signed)
 Attempted to obtain medical history for pre op call via telephone, unable to reach at this time. HIPAA compliant voicemail message left requesting return call to pre surgical testing department.

## 2024-02-28 NOTE — Anesthesia Preprocedure Evaluation (Addendum)
 Anesthesia Evaluation  Patient identified by MRN, date of birth, ID band Patient awake    Reviewed: Allergy & Precautions, NPO status , Patient's Chart, lab work & pertinent test results  History of Anesthesia Complications (+) history of anesthetic complications (per pt HR dropped last time she had EGD)  Airway Mallampati: III  TM Distance: >3 FB Neck ROM: Full    Dental  (+) Teeth Intact, Dental Advisory Given   Pulmonary sleep apnea and Continuous Positive Airway Pressure Ventilation , former smoker   Pulmonary exam normal breath sounds clear to auscultation       Cardiovascular hypertension (149/67 preop, per pt normally 120-130SBP), Pt. on medications Normal cardiovascular exam Rhythm:Regular Rate:Normal  Echo 2020  1. The average left ventricular global longitudinal strain is normal at  -25.5 %.   2. The left ventricle has hyperdynamic systolic function, with an  ejection fraction of >65%. The cavity size was normal. Left ventricular  diastolic Doppler parameters are consistent with impaired relaxation.   3. The right ventricle has normal systolic function. The cavity was  normal. There is no increase in right ventricular wall thickness. Right  ventricular systolic pressure could not be assessed.   4. Left atrial size was mildly dilated.   5. The aortic valve is tricuspid. Mild sclerosis of the aortic valve.  Aortic valve regurgitation was not assessed by color flow Doppler.   6. The aorta is normal unless otherwise noted.     Neuro/Psych  Headaches  negative psych ROS   GI/Hepatic Neg liver ROS,GERD  Medicated and Controlled,,Duodenal adenoma    Endo/Other    Class 3 obesityBMI 39  Renal/GU negative Renal ROS  negative genitourinary   Musculoskeletal  (+) Arthritis , Osteoarthritis,    Abdominal  (+) + obese  Peds  Hematology negative hematology ROS (+)   Anesthesia Other Findings    Reproductive/Obstetrics negative OB ROS                             Anesthesia Physical Anesthesia Plan  ASA: 3  Anesthesia Plan: MAC   Post-op Pain Management:    Induction:   PONV Risk Score and Plan: 2 and Propofol infusion and TIVA  Airway Management Planned: Natural Airway and Simple Face Mask  Additional Equipment: None  Intra-op Plan:   Post-operative Plan:   Informed Consent: I have reviewed the patients History and Physical, chart, labs and discussed the procedure including the risks, benefits and alternatives for the proposed anesthesia with the patient or authorized representative who has indicated his/her understanding and acceptance.       Plan Discussed with: CRNA  Anesthesia Plan Comments: (Took losartan last night)       Anesthesia Quick Evaluation

## 2024-02-29 ENCOUNTER — Encounter (HOSPITAL_COMMUNITY)
Admission: RE | Disposition: A | Discharge status: Home / Self Care | Payer: Self-pay | Source: Home / Self Care | Attending: Internal Medicine

## 2024-02-29 ENCOUNTER — Other Ambulatory Visit: Payer: Self-pay

## 2024-02-29 ENCOUNTER — Ambulatory Visit (HOSPITAL_COMMUNITY): Payer: Self-pay | Admitting: Anesthesiology

## 2024-02-29 ENCOUNTER — Encounter (HOSPITAL_COMMUNITY): Payer: Self-pay | Admitting: Internal Medicine

## 2024-02-29 ENCOUNTER — Ambulatory Visit (HOSPITAL_COMMUNITY)
Admission: RE | Admit: 2024-02-29 | Discharge: 2024-02-29 | Disposition: A | Discharge status: Home / Self Care | Payer: Medicare HMO | Attending: Internal Medicine | Admitting: Internal Medicine

## 2024-02-29 DIAGNOSIS — E669 Obesity, unspecified: Secondary | ICD-10-CM | POA: Insufficient documentation

## 2024-02-29 DIAGNOSIS — K317 Polyp of stomach and duodenum: Secondary | ICD-10-CM

## 2024-02-29 DIAGNOSIS — D132 Benign neoplasm of duodenum: Secondary | ICD-10-CM

## 2024-02-29 DIAGNOSIS — Z86018 Personal history of other benign neoplasm: Secondary | ICD-10-CM | POA: Diagnosis not present

## 2024-02-29 DIAGNOSIS — G4733 Obstructive sleep apnea (adult) (pediatric): Secondary | ICD-10-CM

## 2024-02-29 DIAGNOSIS — E785 Hyperlipidemia, unspecified: Secondary | ICD-10-CM | POA: Insufficient documentation

## 2024-02-29 DIAGNOSIS — I351 Nonrheumatic aortic (valve) insufficiency: Secondary | ICD-10-CM | POA: Diagnosis not present

## 2024-02-29 DIAGNOSIS — Z79899 Other long term (current) drug therapy: Secondary | ICD-10-CM | POA: Diagnosis not present

## 2024-02-29 DIAGNOSIS — M1712 Unilateral primary osteoarthritis, left knee: Secondary | ICD-10-CM | POA: Insufficient documentation

## 2024-02-29 DIAGNOSIS — Z6838 Body mass index (BMI) 38.0-38.9, adult: Secondary | ICD-10-CM | POA: Insufficient documentation

## 2024-02-29 DIAGNOSIS — K219 Gastro-esophageal reflux disease without esophagitis: Secondary | ICD-10-CM | POA: Diagnosis not present

## 2024-02-29 DIAGNOSIS — Z09 Encounter for follow-up examination after completed treatment for conditions other than malignant neoplasm: Secondary | ICD-10-CM | POA: Insufficient documentation

## 2024-02-29 DIAGNOSIS — I10 Essential (primary) hypertension: Secondary | ICD-10-CM | POA: Insufficient documentation

## 2024-02-29 DIAGNOSIS — M1990 Unspecified osteoarthritis, unspecified site: Secondary | ICD-10-CM | POA: Diagnosis not present

## 2024-02-29 DIAGNOSIS — R519 Headache, unspecified: Secondary | ICD-10-CM | POA: Insufficient documentation

## 2024-02-29 DIAGNOSIS — Z87891 Personal history of nicotine dependence: Secondary | ICD-10-CM | POA: Diagnosis not present

## 2024-02-29 HISTORY — PX: ESOPHAGOGASTRODUODENOSCOPY (EGD) WITH PROPOFOL: SHX5813

## 2024-02-29 SURGERY — ESOPHAGOGASTRODUODENOSCOPY (EGD) WITH PROPOFOL
Anesthesia: Monitor Anesthesia Care

## 2024-02-29 MED ORDER — GLYCOPYRROLATE 0.2 MG/ML IJ SOLN
INTRAMUSCULAR | Status: DC | PRN
  Administered 2024-02-29: .2 mg via INTRAVENOUS

## 2024-02-29 MED ORDER — LIDOCAINE 2% (20 MG/ML) 5 ML SYRINGE
INTRAMUSCULAR | Status: DC | PRN
  Administered 2024-02-29: 100 mg via INTRAVENOUS

## 2024-02-29 MED ORDER — PROPOFOL 500 MG/50ML IV EMUL
INTRAVENOUS | Status: DC | PRN
  Administered 2024-02-29: 150 ug/kg/min via INTRAVENOUS

## 2024-02-29 MED ORDER — PROPOFOL 500 MG/50ML IV EMUL
INTRAVENOUS | Status: AC
  Filled 2024-02-29: qty 50

## 2024-02-29 MED ORDER — SODIUM CHLORIDE 0.9 % IV SOLN
INTRAVENOUS | Status: DC
Start: 2024-02-29 — End: 2024-02-29

## 2024-02-29 MED ORDER — PROPOFOL 10 MG/ML IV BOLUS
INTRAVENOUS | Status: DC | PRN
  Administered 2024-02-29: 20 mg via INTRAVENOUS
  Administered 2024-02-29: 80 mg via INTRAVENOUS

## 2024-02-29 SURGICAL SUPPLY — 14 items

## 2024-02-29 NOTE — Op Note (Signed)
 [facility] Patient Name: Sarah Santos Procedure Date: 02/29/2024 MRN: [id number] Attending MD: Iva Boop , MD, [id number] Date of Birth: 18-Oct-1951 CSN: [id number] Age: 73 Admit Type: Ambulatory Procedure:                Upper GI endoscopy Indications:              Follow-up of polyps in the duodenum 6 mm duodenal                            adenoma removed 11/2021 - to reassess. Providers:                Iva Boop, MD, Marja Kays, Technician,                            Lorenza Evangelist, RN Referring MD:              Medicines:                Monitored Anesthesia Care Complications:            No immediate complications. Estimated Blood Loss:     Estimated blood loss: none. Procedure:                Pre-Anesthesia Assessment:                           - Prior to the procedure, a History and Physical                            was performed, and patient medications and                            allergies were reviewed. The patient's tolerance of                            previous anesthesia was also reviewed. The risks                            and benefits of the procedure and the sedation                            options and risks were discussed with the patient.                            All questions were answered, and informed consent                            was obtained. Prior Anticoagulants: The patient has                            taken no anticoagulant or antiplatelet agents. ASA                            Grade Assessment: III - A patient with severe  systemic disease. After reviewing the risks and                            benefits, the patient was deemed in satisfactory                            condition to undergo the procedure.                           After obtaining informed consent, the endoscope was                            passed under direct vision. Throughout the                             procedure, the patient's blood pressure, pulse, and                            oxygen saturations were monitored continuously. The                            [id number]-[id number] ([id number]) Olympus endoscope was introduced                            through the mouth, and advanced to the second part                            of duodenum. The upper GI endoscopy was                            accomplished without difficulty. The patient                            tolerated the procedure well. Scope In: Scope Out: Findings:      The esophagus was normal.      The stomach was normal.      The examined duodenum was normal.      The cardia and gastric fundus were normal on retroflexion. Impression:               - Normal esophagus.                           - Normal stomach.                           - Normal examined duodenum. No residual polyp seen                           - No specimens collected. Moderate Sedation:      Not Applicable - Patient had care per Anesthesia. Recommendation:           - Patient has a contact number available for                            emergencies. The signs and symptoms of potential  delayed complications were discussed with the                            patient. Return to normal activities tomorrow.                            Written discharge instructions were provided to the                            patient.                           - Resume previous diet.                           - Continue present medications. She did not have                            gagging problems like last exam so would consider                            future exams at [facility] if needs repeat (procedure at                            hospital this time due to gagging and associated                            upper airway issues) Procedure Code(s):        --- Professional ---                           [phone], Esophagogastroduodenoscopy, flexible,                             transoral; diagnostic, including collection of                            specimen(s) by brushing or washing, when performed                            (separate procedure) Diagnosis Code(s):        --- Professional ---                           K31.7, Polyp of stomach and duodenum CPT copyright 2022 American Medical Association. All rights reserved. The codes documented in this report are preliminary and upon coder review may  be revised to meet current compliance requirements. Iva Boop, MD 02/29/2024 7:58:10 AM This report has been signed electronically. Number of Addenda: 0

## 2024-02-29 NOTE — H&P (Signed)
 [facility] History and Physical   Primary Care Physician:  Myrlene Broker, MD   Reason for Procedure:   F/u duodenal adenoma - removed 2022  Plan:    EGD     HPI: Sarah Santos is a 73 y.o. female s/p snare removal of duodenal adenoma in 2022 - presents to reassess and follow-up to ensure it was completely removed then. 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   GERD    HEADACHE, CHRONIC    Heart murmur    Hematuria, microscopic    History of anemia    Hx of adenomatous colonic polyps    Hypercalcemia    HYPERLIPIDEMIA    Hypertension    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   Menopausal symptom 05/26/2018   Microscopic hematuria 07/02/2009   Qualifier: Diagnosis of  By: Felicity Coyer MD, Vikki Ports A    OSA on CPAP    Personal history of colonic polyps 01/04/2008   Plantar fasciitis    Primary hyperparathyroidism (HCC)    Routine general medical examination at a health care facility 06/13/2015    Past Surgical History:  Procedure Laterality Date   carpal tunnel Bilateral    COLONOSCOPY W/ POLYPECTOMY  2009, 08/18/11   2009:adenomas, largest 4 cm, rectal carcinoid, hemorrhoids   ESOPHAGOGASTRODUODENOSCOPY     OVARIAN CYST REMOVAL  70's   TUBAL LIGATION      Prior to Admission medications   Medication Sig Start Date End Date Taking? Authorizing Provider  acetaminophen (TYLENOL) 500 MG tablet Take 500 mg by mouth every 6 (six) hours as needed.   Yes [provider]   Cholecalciferol 50 MCG (2000 UT) TABS Take 1 tablet by mouth daily. 09/14/23  Yes [provider]  Glucosamine HCl (GLUCOSAMINE PO) Take by mouth.   Yes [provider]  loratadine (CLARITIN) 10 MG tablet Take 10 mg by mouth daily as needed for allergies.   Yes [provider]  losartan (COZAAR) 50 MG tablet TAKE 1 TABLET EVERY DAY 02/19/24  Yes Myrlene Broker, MD  losartan-hydrochlorothiazide (HYZAAR) 50-12.5 MG tablet Take 1 tablet by mouth daily. 05/01/23  Yes Myrlene Broker, MD  Multiple Vitamin (MULTIVITAMIN PO) Take by mouth.   Yes [provider]  nystatin-triamcinolone (MYCOLOG II) cream Apply 1 application topically daily as needed. 11/03/21  Yes Genia Del, MD  pantoprazole (PROTONIX) 40 MG tablet Take 1 tablet (40 mg total) by mouth daily. 05/01/23  Yes Myrlene Broker, MD  simvastatin (ZOCOR) 20 MG tablet TAKE 1 TABLET TWICE WEEKLY 11/01/23  Yes Myrlene Broker, MD  Turmeric (QC TUMERIC COMPLEX PO) Take by mouth.   Yes [provider]    Current Facility-Administered Medications  Medication Dose Route Frequency Provider Last Rate Last Admin   0.9 %  sodium chloride infusion   Intravenous Continuous Iva Boop, MD        Allergies as of 01/03/2024   (No Known Allergies)    Family History  Problem  Relation Age of Onset   Pulmonary embolism Mother    Hypertension Mother    Stroke Father    Diabetes Father    Heart failure Father    Heart disease Father    Colon polyps Sister    Multiple myeloma Sister    Other Brother        prediabetes   Hyperlipidemia Brother    Heart disease Brother    Colon polyps Brother    Stomach cancer Paternal Aunt    Cancer Paternal Aunt        Lung   Diabetes Paternal Grandmother    Hyperlipidemia Daughter    Other Daughter        prediabetes   Colon cancer Neg Hx    Esophageal cancer Neg Hx    Rectal cancer Neg Hx     Social History   Socioeconomic  History   Marital status: Divorced    Spouse name: Not on file   Number of children: 2   Years of education: Not on file   Highest education level: Not on file  Occupational History   Occupation: retired  Tobacco Use   Smoking status: Former    Current packs/day: 0.00    Types: Cigarettes    Quit date: 1980    Years since quitting: 45.2   Smokeless tobacco: Never  Vaping Use   Vaping status: Never Used  Substance and Sexual Activity   Alcohol use: No   Drug use: No   Sexual activity: Not Currently    Birth control/protection: Post-menopausal, Surgical    Comment: BTL, greater than 16, more than 5  Other Topics Concern   Not on file  Social History Narrative   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   Social Drivers of Health   Financial Resource Strain: Low Risk  (04/18/2022)   Overall Financial Resource Strain (CARDIA)    Difficulty of Paying Living Expenses: Not hard at all  Food Insecurity: No Food Insecurity (04/18/2022)   Hunger Vital Sign    Worried About Running Out of Food in the Last Year: Never true    Ran Out of Food in the Last Year: Never true  Transportation Needs: No Transportation Needs (04/18/2022)   PRAPARE - Administrator, Civil Service (Medical): No    Lack of Transportation (Non-Medical): No  Physical Activity: Insufficiently Active (04/18/2022)   Exercise Vital Sign    Days of Exercise per Week: 3 days    Minutes of Exercise per Session: 40 min  Stress: No Stress Concern Present (04/18/2022)   [employer] of Occupational Health - Occupational Stress Questionnaire    Feeling of Stress : Not at all  Social Connections: Moderately Isolated (04/18/2022)   Social Connection and Isolation Panel [NHANES]    Frequency of Communication with Friends and Family: Twice a week    Frequency of Social Gatherings with Friends and Family: Twice a week    Attends Religious Services: More than 4 times per year    Active Member of [employer] or  Organizations: No    Attends Banker Meetings: Never    Marital Status: Divorced  Catering manager Violence: Not At Risk (04/18/2022)   Humiliation, Afraid, Rape, and Kick questionnaire    Fear of Current or Ex-Partner: No    Emotionally Abused: No    Physically Abused: No    Sexually Abused: No    Review of Systems:  All other review of systems negative except as mentioned in  the HPI.  Physical Exam: Vital signs BP (!) 149/67   Pulse 75   Temp 98.4 F (36.9 C) (Tympanic)   Resp (!) 22   Ht 5\' 2"  (1.575 m)   Wt 96 kg   SpO2 98%   BMI 38.71 kg/m   General:   Alert,  Well-developed, well-nourished, pleasant and cooperative in NAD Lungs:  Clear throughout to auscultation.   Heart:  Regular rate and rhythm; no murmurs, clicks, rubs,  or gallops. Abdomen:  Soft, nontender and nondistended. Normal bowel sounds.   Neuro/Psych:  Alert and cooperative. Normal mood and affect. A and O x 3   @Jolayne Branson  Sena Slate, MD, [facility] [phone] (pager) 02/29/2024 7:10 AM@

## 2024-02-29 NOTE — Anesthesia Postprocedure Evaluation (Signed)
 Anesthesia Post Note  Patient: Shiree Trueitt-Johnson  Procedure(s) Performed: ESOPHAGOGASTRODUODENOSCOPY (EGD) WITH PROPOFOL     Patient location during evaluation: PACU Anesthesia Type: MAC Level of consciousness: awake and alert Pain management: pain level controlled Vital Signs Assessment: post-procedure vital signs reviewed and stable Respiratory status: spontaneous breathing, nonlabored ventilation and respiratory function stable Cardiovascular status: blood pressure returned to baseline and stable Postop Assessment: no apparent nausea or vomiting Anesthetic complications: no   No notable events documented.  Last Vitals:  Vitals:   02/29/24 0820 02/29/24 0825  BP: (!) 123/56 (!) 140/50  Pulse: 74 63  Resp: 17 18  Temp:    SpO2: 97% 99%    Last Pain:  Vitals:   02/29/24 0825  TempSrc:   PainSc: 0-No pain                 Lannie Fields

## 2024-02-29 NOTE — Transfer of Care (Signed)
 Immediate Anesthesia Transfer of Care Note  Patient: Sarah Santos  Procedure(s) Performed: ESOPHAGOGASTRODUODENOSCOPY (EGD) WITH PROPOFOL  Patient Location: Endoscopy Unit  Anesthesia Type:General  Level of Consciousness: awake, alert , and oriented  Airway & Oxygen Therapy: Patient Spontanous Breathing  Post-op Assessment: Report given to RN and Post -op Vital signs reviewed and stable  Post vital signs: Reviewed and stable  Last Vitals:  Vitals Value Taken Time  BP    Temp    Pulse    Resp    SpO2      Last Pain:  Vitals:   02/29/24 0646  TempSrc: Tympanic  PainSc: 0-No pain         Complications: No notable events documented.

## 2024-02-29 NOTE — Discharge Instructions (Signed)
 The polyp is gone - everything looks normal and no gagging problems.  I appreciate the opportunity to care for you. Iva Boop, MD, FACG  YOU HAD AN ENDOSCOPIC PROCEDURE TODAY: Refer to the procedure report and other information in the discharge instructions given to you for any specific questions about what was found during the examination. If this information does not answer your questions, please call Dr. Marvell Fuller office at [phone] to clarify.   YOU SHOULD EXPECT: Some feelings of bloating in the abdomen. Passage of more gas than usual. Walking can help get rid of the air that was put into your GI tract during the procedure and reduce the bloating. If you had a lower endoscopy (such as a colonoscopy or flexible sigmoidoscopy) you may notice spotting of blood in your stool or on the toilet paper. Some abdominal soreness may be present for a day or two, also.  DIET: Your first meal following the procedure should be a light meal and then it is ok to progress to your normal diet. A half-sandwich or bowl of soup is an example of a good first meal. Heavy or fried foods are harder to digest and may make you feel nauseous or bloated. Drink plenty of fluids but you should avoid alcoholic beverages for 24 hours.   ACTIVITY: Your care partner should take you home directly after the procedure. You should plan to take it easy, moving slowly for the rest of the day. You can resume normal activity the day after the procedure however YOU SHOULD NOT DRIVE, use power tools, machinery or perform tasks that involve climbing or major physical exertion for 24 hours (because of the sedation medicines used during the test).   SYMPTOMS TO REPORT IMMEDIATELY: A gastroenterologist can be reached at any hour. Please call [phone]  for any of the following symptoms:  Following lower endoscopy (colonoscopy, flexible sigmoidoscopy) Excessive amounts of blood in the stool  Significant tenderness, worsening of  abdominal pains  Swelling of the abdomen that is new, acute  Fever of 100 or higher  Following upper endoscopy (EGD, EUS, ERCP, esophageal dilation) Vomiting of blood or coffee ground material  New, significant abdominal pain  New, significant chest pain or pain under the shoulder blades  Painful or persistently difficult swallowing  New shortness of breath  Black, tarry-looking or red, bloody stools  FOLLOW UP:  If any biopsies were taken you will be contacted by phone or by letter within the next 1-3 weeks. Call [phone]  if you have not heard about the biopsies in 3 weeks.  Please also call with any specific questions about appointments or follow up tests.

## 2024-03-02 ENCOUNTER — Other Ambulatory Visit: Payer: Self-pay | Admitting: Internal Medicine

## 2024-03-03 ENCOUNTER — Encounter (HOSPITAL_COMMUNITY): Payer: Self-pay | Admitting: Internal Medicine

## 2024-03-18 DIAGNOSIS — H11153 Pinguecula, bilateral: Secondary | ICD-10-CM | POA: Diagnosis not present

## 2024-03-18 DIAGNOSIS — H524 Presbyopia: Secondary | ICD-10-CM | POA: Diagnosis not present

## 2024-03-18 DIAGNOSIS — H52203 Unspecified astigmatism, bilateral: Secondary | ICD-10-CM | POA: Diagnosis not present

## 2024-03-18 DIAGNOSIS — H43812 Vitreous degeneration, left eye: Secondary | ICD-10-CM | POA: Diagnosis not present

## 2024-03-18 DIAGNOSIS — H04123 Dry eye syndrome of bilateral lacrimal glands: Secondary | ICD-10-CM | POA: Diagnosis not present

## 2024-03-18 DIAGNOSIS — H5203 Hypermetropia, bilateral: Secondary | ICD-10-CM | POA: Diagnosis not present

## 2024-03-18 DIAGNOSIS — H2513 Age-related nuclear cataract, bilateral: Secondary | ICD-10-CM | POA: Diagnosis not present

## 2024-05-01 ENCOUNTER — Encounter: Payer: Self-pay | Admitting: Internal Medicine

## 2024-05-01 ENCOUNTER — Ambulatory Visit (INDEPENDENT_AMBULATORY_CARE_PROVIDER_SITE_OTHER): Payer: Medicare HMO | Admitting: Internal Medicine

## 2024-05-01 VITALS — BP 120/60 | HR 72 | Temp 98.1°F | Ht 62.0 in | Wt 214.0 lb

## 2024-05-01 DIAGNOSIS — E210 Primary hyperparathyroidism: Secondary | ICD-10-CM

## 2024-05-01 DIAGNOSIS — I10 Essential (primary) hypertension: Secondary | ICD-10-CM

## 2024-05-01 DIAGNOSIS — Z0000 Encounter for general adult medical examination without abnormal findings: Secondary | ICD-10-CM

## 2024-05-01 DIAGNOSIS — G4733 Obstructive sleep apnea (adult) (pediatric): Secondary | ICD-10-CM

## 2024-05-01 DIAGNOSIS — E785 Hyperlipidemia, unspecified: Secondary | ICD-10-CM

## 2024-05-01 DIAGNOSIS — Z1231 Encounter for screening mammogram for malignant neoplasm of breast: Secondary | ICD-10-CM

## 2024-05-01 DIAGNOSIS — R7303 Prediabetes: Secondary | ICD-10-CM | POA: Diagnosis not present

## 2024-05-01 DIAGNOSIS — K219 Gastro-esophageal reflux disease without esophagitis: Secondary | ICD-10-CM

## 2024-05-01 LAB — COMPREHENSIVE METABOLIC PANEL WITH GFR
ALT: 14 U/L (ref 0–35)
AST: 15 U/L (ref 0–37)
Albumin: 4.4 g/dL (ref 3.5–5.2)
Alkaline Phosphatase: 95 U/L (ref 39–117)
BUN: 17 mg/dL (ref 6–23)
CO2: 29 meq/L (ref 19–32)
Calcium: 11.1 mg/dL — ABNORMAL HIGH (ref 8.4–10.5)
Chloride: 102 meq/L (ref 96–112)
Creatinine, Ser: 0.95 mg/dL (ref 0.40–1.20)
GFR: 59.97 mL/min — ABNORMAL LOW (ref >60.00)
Glucose, Bld: 83 mg/dL (ref 70–99)
Potassium: 3.9 meq/L (ref 3.5–5.1)
Sodium: 137 meq/L (ref 135–145)
Total Bilirubin: 0.4 mg/dL (ref 0.2–1.2)
Total Protein: 7.5 g/dL (ref 6.0–8.3)

## 2024-05-01 LAB — CBC
HCT: 34.4 % — ABNORMAL LOW (ref 36.0–46.0)
Hemoglobin: 11.1 g/dL — ABNORMAL LOW (ref 12.0–15.0)
MCHC: 32.2 g/dL (ref 30.0–36.0)
MCV: 81.7 fl (ref 78.0–100.0)
Platelets: 260 10*3/uL (ref 150.0–400.0)
RBC: 4.21 Mil/uL (ref 3.87–5.11)
RDW: 13.5 % (ref 11.5–15.5)
WBC: 5.9 10*3/uL (ref 4.0–10.5)

## 2024-05-01 LAB — LIPID PANEL
Cholesterol: 225 mg/dL — ABNORMAL HIGH (ref 0–200)
HDL: 58.3 mg/dL (ref >39.00)
LDL Cholesterol: 130 mg/dL — ABNORMAL HIGH (ref 0–99)
NonHDL: 166.64
Total CHOL/HDL Ratio: 4
Triglycerides: 181 mg/dL — ABNORMAL HIGH (ref 0.0–149.0)
VLDL: 36.2 mg/dL (ref 0.0–40.0)

## 2024-05-01 LAB — HEMOGLOBIN A1C: Hgb A1c MFr Bld: 6.2 % (ref 4.6–6.5)

## 2024-05-01 MED ORDER — PANTOPRAZOLE SODIUM 40 MG PO TBEC: 40.0000 mg | DELAYED_RELEASE_TABLET | Freq: Every day | ORAL | 3 refills | Status: AC

## 2024-05-01 MED ORDER — SIMVASTATIN 20 MG PO TABS: 20.0000 mg | ORAL_TABLET | ORAL | 3 refills | Status: DC

## 2024-05-01 MED ORDER — LOSARTAN POTASSIUM-HCTZ 50-12.5 MG PO TABS: 1.0000 | ORAL_TABLET | Freq: Every day | ORAL | 3 refills | Status: AC

## 2024-05-01 MED ORDER — LOSARTAN POTASSIUM 50 MG PO TABS: 50.0000 mg | ORAL_TABLET | Freq: Every day | ORAL | 3 refills | Status: AC

## 2024-05-01 NOTE — Patient Instructions (Addendum)
 We have refilled the medicine. Before the test just take losartan  (2 pills daily) and do not take losartan /hydrochlorothiazide . Once the test is over resume your normal losartan /hydrochlorothiazide .   We have ordered the mammogram and you can get the shingles vaccine at the pharmacy.

## 2024-05-01 NOTE — Progress Notes (Signed)
   Subjective:   Patient ID: Sarah Santos, female    DOB: Feb 10, 1951, 73 y.o.   MRN: [id number]  HPI The patient is here for physical.  PMH, [facility], social history reviewed and updated  Review of Systems  Constitutional: Negative.   HENT: Negative.    Eyes: Negative.   Respiratory:  Negative for cough, chest tightness and shortness of breath.   Cardiovascular:  Negative for chest pain, palpitations and leg swelling.  Gastrointestinal:  Negative for abdominal distention, abdominal pain, constipation, diarrhea, nausea and vomiting.  Musculoskeletal: Negative.   Skin: Negative.   Neurological: Negative.   Psychiatric/Behavioral: Negative.      Objective:  Physical Exam Constitutional:      Appearance: She is well-developed.  HENT:     Head: Normocephalic and atraumatic.  Cardiovascular:     Rate and Rhythm: Normal rate and regular rhythm.  Pulmonary:     Effort: Pulmonary effort is normal. No respiratory distress.     Breath sounds: Normal breath sounds. No wheezing or rales.  Abdominal:     General: Bowel sounds are normal. There is no distension.     Palpations: Abdomen is soft.     Tenderness: There is no abdominal tenderness. There is no rebound.  Musculoskeletal:     Cervical back: Normal range of motion.  Skin:    General: Skin is warm and dry.  Neurological:     Mental Status: She is alert and oriented to person, place, and time.     Coordination: Coordination normal.     Vitals:   05/01/24 0952  BP: 120/60  Pulse: 72  Temp: 98.1 F (36.7 C)  TempSrc: Oral  SpO2: 91%  Weight: 214 lb (97.1 kg)  Height: 5\' 2"  (1.575 m)    Assessment & Plan:

## 2024-05-03 NOTE — Assessment & Plan Note (Signed)
 Checking HGA1c and adjust as needed.

## 2024-05-03 NOTE — Assessment & Plan Note (Signed)
Flu shot yearly. Pneumonia complete. Shingrix due at pharmacy. Tetanus due at pharmacy. Colonoscopy up to date. Mammogram up to date, pap smear aged out and dexa up to date. Counseled about sun safety and mole surveillance. Counseled about the dangers of distracted driving. Given 10 year screening recommendations.

## 2024-05-03 NOTE — Assessment & Plan Note (Signed)
 Checking lipid panel and adjust simvastatin  as needed takes twice a week.

## 2024-05-03 NOTE — Assessment & Plan Note (Signed)
 Taking protonix  daily and controlled continue.

## 2024-05-03 NOTE — Assessment & Plan Note (Signed)
 Continue CPAP nightly and still getting benefit.

## 2024-05-03 NOTE — Assessment & Plan Note (Signed)
 Checking CMP and adjust as needed monitoring DEXA intermittently.

## 2024-05-03 NOTE — Assessment & Plan Note (Signed)
 BP at goal on losartan /hydrochlorothiazide . Checking CMP and adjust as needed.

## 2024-05-03 NOTE — Assessment & Plan Note (Signed)
 Checking lipid panel and HgA1c. BMI 39 and complicated by hypertension and pre-diabetes.

## 2024-05-06 ENCOUNTER — Encounter: Payer: Self-pay | Admitting: Internal Medicine

## 2024-05-08 ENCOUNTER — Ambulatory Visit: Payer: Self-pay

## 2024-05-08 ENCOUNTER — Ambulatory Visit
Admission: RE | Admit: 2024-05-08 | Discharge: 2024-05-08 | Disposition: A | Discharge status: Home / Self Care | Source: Ambulatory Visit | Attending: Internal Medicine | Admitting: Internal Medicine

## 2024-05-08 DIAGNOSIS — Z1231 Encounter for screening mammogram for malignant neoplasm of breast: Secondary | ICD-10-CM

## 2024-05-13 ENCOUNTER — Other Ambulatory Visit: Payer: Self-pay | Admitting: Internal Medicine

## 2024-05-13 DIAGNOSIS — R928 Other abnormal and inconclusive findings on diagnostic imaging of breast: Secondary | ICD-10-CM

## 2024-05-21 ENCOUNTER — Telehealth: Payer: Self-pay | Admitting: Internal Medicine

## 2024-05-21 NOTE — Telephone Encounter (Unsigned)
 Copied from CRM [id number]. Topic: Clinical - Medication Question >> May 21, 2024 10:52 AM Baldo Levan wrote: Reason for CRM: Patient saw Dr. Nicolette Barrio and was supposed to have the clotrimazole-betamethasone (LOTRISONE) cream [[id number]] called into [facility]. Patient states she has not received the medication.

## 2024-05-22 ENCOUNTER — Other Ambulatory Visit: Payer: Self-pay

## 2024-05-22 MED ORDER — CLOTRIMAZOLE-BETAMETHASONE 1-0.05 % EX CREA: TOPICAL_CREAM | Freq: Two times a day (BID) | CUTANEOUS | 3 refills | Status: AC

## 2024-05-22 NOTE — Telephone Encounter (Signed)
Is this [place] to send in? 

## 2024-05-22 NOTE — Telephone Encounter (Signed)
 Sent in

## 2024-05-23 ENCOUNTER — Ambulatory Visit
Admission: RE | Admit: 2024-05-23 | Discharge: 2024-05-23 | Disposition: A | Discharge status: Home / Self Care | Source: Ambulatory Visit | Attending: Internal Medicine | Admitting: Internal Medicine

## 2024-05-23 DIAGNOSIS — N6001 Solitary cyst of right breast: Secondary | ICD-10-CM | POA: Diagnosis not present

## 2024-05-23 DIAGNOSIS — R928 Other abnormal and inconclusive findings on diagnostic imaging of breast: Secondary | ICD-10-CM

## 2024-09-06 ENCOUNTER — Ambulatory Visit (INDEPENDENT_AMBULATORY_CARE_PROVIDER_SITE_OTHER)

## 2024-09-06 VITALS — BP 120/70 | HR 68 | Ht 61.5 in | Wt 216.8 lb

## 2024-09-06 DIAGNOSIS — Z0000 Encounter for general adult medical examination without abnormal findings: Secondary | ICD-10-CM

## 2024-09-06 NOTE — Patient Instructions (Signed)
 [name],  Thank you for taking the time for your Medicare Wellness Visit. I appreciate your continued commitment to your health goals. Please review the care plan we discussed, and feel free to reach out if I can assist you further.  Medicare recommends these wellness visits once per year to help you and your care team stay ahead of potential health issues. These visits are designed to focus on prevention, allowing your provider to concentrate on managing your acute and chronic conditions during your regular appointments.  Please note that Annual Wellness Visits do not include a physical exam. Some assessments may be limited, especially if the visit was conducted virtually. If needed, we may recommend a separate in-person follow-up with your provider.  Ongoing Care Seeing your primary care provider every 3 to 6 months helps us  monitor your health and provide consistent, personalized care. Last office visit on 05/01/2024.  You are due for a Shingles vaccine and a Tetanus vaccine.  These can be given at your pharmacy.  Remember to get your Flu vaccine on or after 10/09/24.  Also remember that you hold the power to give up your sweet tooth.  Keep up the good work.  Referrals If a referral was made during today's visit and you haven't received any updates within two weeks, please contact the referred provider directly to check on the status.  Recommended Screenings:  Health Maintenance  Topic Date Due   Hepatitis C Screening  Never done   Zoster (Shingles) Vaccine (1 of 2) Never done   DTaP/Tdap/Td vaccine (2 - Tdap) 12/27/2011   Medicare Annual Wellness Visit  04/30/2024   Flu Shot  07/26/2024   COVID-19 Vaccine (4 - 2025-26 season) 08/26/2024   Breast Cancer Screening  05/08/2025   Colon Cancer Screening  12/08/2026   Pneumococcal Vaccine for age over 78  Completed   DEXA scan (bone density measurement)  Completed   HPV Vaccine  Aged Out   Meningitis B Vaccine  Aged Out        09/06/2024    1:18 PM  Advanced Directives  Does Patient Have a Medical Advance Directive? Yes  Type of Estate agent of [place];Living will  Copy of Healthcare Power of Attorney in Chart? No - copy requested   Advance Care Planning is important because it: Ensures you receive medical care that aligns with your values, goals, and preferences. Provides guidance to your family and loved ones, reducing the emotional burden of decision-making during critical moments.  Vision: Annual vision screenings are recommended for early detection of glaucoma, cataracts, and diabetic retinopathy. These exams can also reveal signs of chronic conditions such as diabetes and high blood pressure.  Dental: Annual dental screenings help detect early signs of oral cancer, gum disease, and other conditions linked to overall health, including heart disease and diabetes.  Please see the attached documents for additional preventive care recommendations.

## 2024-09-06 NOTE — Progress Notes (Signed)
 Subjective:   Sarah Santos is a 73 y.o. who presents for a Medicare Wellness preventive visit.  As a reminder, Annual Wellness Visits don't include a physical exam, and some assessments may be limited, especially if this visit is performed virtually. We may recommend an in-person follow-up visit with your provider if needed.  Visit Complete: In person  Persons Participating in Visit: Patient.  AWV Questionnaire: Yes: Patient Medicare AWV questionnaire was completed by the patient on 09/05/2024; I have confirmed that all information answered by patient is correct and no changes since this date.  Cardiac Risk Factors include: advanced age (>76men, >87 women);dyslipidemia;obesity (BMI >30kg/m2);hypertension     Objective:    Today's Vitals   09/06/24 1310  Weight: 216 lb 12.8 oz (98.3 kg)  Height: 5' 1.5 (1.562 m)   Body mass index is 40.3 kg/m.     09/06/2024    1:18 PM 02/29/2024    6:40 AM 04/18/2022    9:10 AM 06/09/2020    2:30 PM 05/12/2017    7:35 AM  Advanced Directives  Does Patient Have a Medical Advance Directive? Yes No Yes Yes Yes   Type of Estate agent of [place];Living will  Healthcare Power of [place];Living will  Healthcare Power of [place];Living will  Does patient want to make changes to medical advance directive?    No - Patient declined   Copy of Healthcare Power of Attorney in Chart? No - copy requested  No - copy requested  No - copy requested   Would patient like information on creating a medical advance directive?  No - Patient declined        Data saved with a previous flowsheet row definition    Current Medications (verified) Outpatient Encounter Medications as of 09/06/2024  Medication Sig   acetaminophen (TYLENOL) 500 MG tablet Take 500 mg by mouth every 6 (six) hours as needed.   Cholecalciferol 50 MCG (2000 UT) TABS Take 1 tablet by mouth daily.   clotrimazole -betamethasone  (LOTRISONE ) cream Apply topically 2  (two) times daily.   Glucosamine HCl (GLUCOSAMINE PO) Take by mouth.   loratadine  (CLARITIN ) 10 MG tablet Take 10 mg by mouth daily as needed for allergies.   losartan  (COZAAR ) 50 MG tablet Take 1 tablet (50 mg total) by mouth daily.   losartan -hydrochlorothiazide  (HYZAAR) 50-12.5 MG tablet Take 1 tablet by mouth daily.   Multiple Vitamin (MULTIVITAMIN PO) Take by mouth.   nystatin -triamcinolone  (MYCOLOG II) cream Apply 1 application topically daily as needed.   pantoprazole  (PROTONIX ) 40 MG tablet Take 1 tablet (40 mg total) by mouth daily.   simvastatin  (ZOCOR ) 20 MG tablet Take 1 tablet (20 mg total) by mouth 2 (two) times a week.   Turmeric (QC TUMERIC COMPLEX PO) Take by mouth.   No facility-administered encounter medications on file as of 09/06/2024.    Allergies (verified) Patient has no known allergies.   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   Menopausal symptom 05/26/2018   Microscopic hematuria 07/02/2009   Qualifier: Diagnosis of  By: Inocencio MD, Berwyn A    OSA on CPAP    Personal history of colonic polyps 01/04/2008   Plantar fasciitis    Primary hyperparathyroidism (HCC)    Routine general medical examination at a health care facility 06/13/2015   Past Surgical History:  Procedure Laterality Date   carpal tunnel Bilateral    COLONOSCOPY W/ POLYPECTOMY  2009, 08/18/11    2009:adenomas, largest 4 cm, rectal carcinoid, hemorrhoids   ESOPHAGOGASTRODUODENOSCOPY     ESOPHAGOGASTRODUODENOSCOPY (EGD) WITH PROPOFOL  N/A 02/29/2024   Procedure: ESOPHAGOGASTRODUODENOSCOPY (EGD) WITH PROPOFOL ;  Surgeon: Avram Lupita BRAVO, MD;  Location: [facility];  Service: Gastroenterology;  Laterality: N/A;   OVARIAN CYST REMOVAL  70's   TUBAL LIGATION     Family History  Problem Relation Age of Onset   Pulmonary embolism Mother    Hypertension Mother    Stroke Father    Diabetes Father    Heart failure Father    Heart disease Father    Colon polyps Sister    Multiple myeloma Sister    Other Brother        prediabetes   Hyperlipidemia Brother    Heart disease Brother    Colon polyps Brother    Stomach cancer Paternal Aunt    Cancer Paternal Aunt        Lung   Diabetes Paternal Grandmother    Hyperlipidemia Daughter    Other Daughter        prediabetes   Colon cancer Neg Hx    Esophageal cancer Neg Hx    Rectal cancer Neg Hx    Social History   Socioeconomic History   Marital status: Divorced    Spouse name: Not on file   Number of children: 2   Years of education: Not on file   Highest education level: Some college, no degree  Occupational History   Occupation: retired  Tobacco Use   Smoking status: Former    Current packs/day: 0.00    Types: Cigarettes    Quit date: 1980    Years since quitting: 45.7   Smokeless tobacco: Never  Vaping Use   Vaping status: Never Used  Substance and Sexual Activity   Alcohol use: No   Drug use: No   Sexual activity: Not Currently    Birth control/protection: Post-menopausal, Surgical    Comment: BTL, greater than 16, more than 5  Other Topics Concern   Not on file  Social History Narrative   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      Lives alone/2025   Social Drivers of Health   Financial Resource Strain: Low Risk  (09/05/2024)   Overall Financial Resource Strain (CARDIA)    Difficulty of Paying Living  Expenses: Not hard at all  Food Insecurity: No Food Insecurity (09/05/2024)   Hunger Vital Sign    Worried About Running Out of Food in the Last Year: Never true    Ran Out of Food in the Last Year: Never true  Transportation Needs: No Transportation Needs (09/05/2024)   PRAPARE - Administrator, Civil Service (Medical): No    Lack of Transportation (Non-Medical): No  Physical Activity: Insufficiently Active (09/05/2024)   Exercise Vital Sign    Days  of Exercise per Week: 3 days    Minutes of Exercise per Session: 30 min  Stress: No Stress Concern Present (09/05/2024)   [employer] of Occupational Health - Occupational Stress Questionnaire    Feeling of Stress: Only a little  Social Connections: Moderately Isolated (09/05/2024)   Social Connection and Isolation Panel    Frequency of Communication with Friends and Family: Once a week    Frequency of Social Gatherings with Friends and Family: Once a week    Attends Religious Services: More than 4 times per year    Active Member of [employer] or Organizations: Yes    Attends Banker Meetings: 1 to 4 times per year    Marital Status: Divorced    Tobacco Counseling Counseling given: Not Answered    Clinical Intake:  Pre-visit preparation completed: Yes  Pain : No/denies pain     BMI - recorded: 40.3 Nutritional Status: BMI > 30  Obese Nutritional Risks: None Diabetes: No  Lab Results  Component Value Date   HGBA1C 6.2 05/01/2024   HGBA1C 6.3 05/01/2023   HGBA1C 6.1 04/26/2022     How often do you need to have someone help you when you read instructions, pamphlets, or other written materials from your doctor or pharmacy?: 1 - Never  Interpreter Needed?: No  Information entered by :: Hansen Carino, RMA   Activities of Daily Living     09/05/2024   12:00 PM  In your present state of health, do you have any difficulty performing the following activities:  Hearing? 0  Vision? 0  Difficulty  concentrating or making decisions? 0  Walking or climbing stairs? 0  Dressing or bathing? 0  Doing errands, shopping? 0  Preparing Food and eating ? N  Using the Toilet? N  In the past six months, have you accidently leaked urine? N  Do you have problems with loss of bowel control? N  Managing your Medications? N  Managing your Finances? N  Housekeeping or managing your Housekeeping? N    Patient Care Team: Rollene Almarie LABOR, MD as PCP - General (Internal Medicine) Monetta Redell PARAS, MD as PCP - Cardiology (Cardiology) Avram Lupita BRAVO, MD (Gastroenterology) Latisha Medford, MD (Obstetrics and Gynecology) Gaston Hamilton, MD (Urology)  I have updated your Care Teams any recent Medical Services you may have received from other providers in the past year.     Assessment:   This is a routine wellness examination for [facility].  Hearing/Vision screen Hearing Screening - Comments:: Denies hearing difficulties   Vision Screening - Comments:: [facility] Valarie [facility] [place]   Goals Addressed               This Visit's Progress     Patient Stated (pt-stated)        Lose some weight/2025       Depression Screen     09/06/2024    1:21 PM 01/17/2024    4:00 PM 05/01/2023    9:00 AM 04/26/2022    8:46 AM 04/18/2022    9:17 AM 04/18/2022    9:09 AM 04/13/2021   12:54 PM  PHQ 2/9 Scores  PHQ - 2 Score 0 0 0 2 0 0 0  PHQ- 9 Score 1  0 5       Fall Risk     09/05/2024   12:00 PM 01/17/2024    4:00 PM 05/01/2023    9:00 AM 04/26/2022    8:46 AM 04/18/2022  9:17 AM  Fall Risk   Falls in the past year? 0 0 0 0 0  Number falls in past yr: 0 0 0 0 0  Injury with Fall? 0 0 0 0 0  Risk for fall due to :  No Fall Risks     Follow up Falls evaluation completed;Falls prevention discussed Falls evaluation completed Falls evaluation completed  Falls evaluation completed      Data saved with a previous flowsheet row definition    MEDICARE RISK AT HOME:   Medicare Risk at Home Any stairs in or around the home?: (Patient-Rptd) No If so, are there any without handrails?: (Patient-Rptd) No Home free of loose throw rugs in walkways, pet beds, electrical cords, etc?: (Patient-Rptd) No Adequate lighting in your home to reduce risk of falls?: (Patient-Rptd) Yes Life alert?: (Patient-Rptd) No Use of a cane, walker or w/c?: (Patient-Rptd) No Grab bars in the bathroom?: (Patient-Rptd) No Shower chair or bench in shower?: (Patient-Rptd) No Elevated toilet seat or a handicapped toilet?: (Patient-Rptd) No  TIMED UP AND GO:  Was the test performed?  Yes  Length of time to ambulate 10 feet: 20 sec Gait steady and fast without use of assistive device  Cognitive Function: Declined/Normal: No cognitive concerns noted by patient or family. Patient alert, oriented, able to answer questions appropriately and recall recent events. No signs of memory loss or confusion.        Immunizations Immunization History  Administered Date(s) Administered   Fluad Quad(high Dose 65+) 10/10/2019, 10/10/2020, 10/04/2021   INFLUENZA, HIGH DOSE SEASONAL PF 09/23/2016, 10/16/2018   Influenza Split 09/25/2012, 09/25/2017   Influenza,inj,Quad PF,6+ Mos 10/09/2014   Influenza,inj,quad, With Preservative 09/25/2016   PFIZER(Purple Top)SARS-COV-2 Vaccination 02/16/2020, 03/17/2020, 01/26/2021   PNEUMOCOCCAL CONJUGATE-20 04/26/2022   Td 12/26/2001    Screening Tests Health Maintenance  Topic Date Due   Hepatitis C Screening  Never done   Zoster Vaccines- Shingrix (1 of 2) Never done   DTaP/Tdap/Td (2 - Tdap) 12/27/2011   Medicare Annual Wellness (AWV)  04/30/2024   Influenza Vaccine  07/26/2024   COVID-19 Vaccine (4 - 2025-26 season) 08/26/2024   Mammogram  05/08/2025   Colonoscopy  12/08/2026   Pneumococcal Vaccine: 50+ Years  Completed   DEXA SCAN  Completed   HPV VACCINES  Aged Out   Meningococcal B Vaccine  Aged Out    Health Maintenance Items  Addressed: See Nurse Notes at the end of this note  Additional Screening:  Vision Screening: Recommended annual ophthalmology exams for early detection of glaucoma and other disorders of the eye. Is the patient up to date with their annual eye exam?  No  Who is the provider or what is the name of the office in which the patient attends annual eye exams? Dr. Valarie in [facility]  Dental Screening: Recommended annual dental exams for proper oral hygiene  Community Resource Referral / Chronic Care Management: CRR required this visit?  No   CCM required this visit?  No   Plan:    I have personally reviewed and noted the following in the patient's chart:   Medical and social history Use of alcohol, tobacco or illicit drugs  Current medications and supplements including opioid prescriptions. Patient is not currently taking opioid prescriptions. Functional ability and status Nutritional status Physical activity Advanced directives List of other physicians Hospitalizations, surgeries, and ER visits in previous 12 months Vitals Screenings to include cognitive, depression, and falls Referrals and appointments  In addition, I have reviewed  and discussed with patient certain preventive protocols, quality metrics, and best practice recommendations. A written personalized care plan for preventive services as well as general preventive health recommendations were provided to patient.   Lavonda Thal L Michi Herrmann, CMA   09/06/2024   After Visit Summary: (MyChart) Due to this being a telephonic visit, the after visit summary with patients personalized plan was offered to patient via MyChart   Notes: Patient is due for a Shingrix vaccine, a Tdap and a Flu vaccine.  She had no other concerns to address today.  Patient is als due for a Hep C screening.

## 2024-09-26 ENCOUNTER — Ambulatory Visit (HOSPITAL_BASED_OUTPATIENT_CLINIC_OR_DEPARTMENT_OTHER)
Admission: EM | Admit: 2024-09-26 | Discharge: 2024-09-26 | Disposition: A | Discharge status: Home / Self Care | Attending: Family Medicine | Admitting: Family Medicine

## 2024-09-26 ENCOUNTER — Encounter (HOSPITAL_BASED_OUTPATIENT_CLINIC_OR_DEPARTMENT_OTHER): Payer: Self-pay

## 2024-09-26 ENCOUNTER — Other Ambulatory Visit (HOSPITAL_BASED_OUTPATIENT_CLINIC_OR_DEPARTMENT_OTHER): Payer: Self-pay

## 2024-09-26 DIAGNOSIS — M25462 Effusion, left knee: Secondary | ICD-10-CM

## 2024-09-26 DIAGNOSIS — M25562 Pain in left knee: Secondary | ICD-10-CM | POA: Diagnosis not present

## 2024-09-26 MED ORDER — CELECOXIB 200 MG PO CAPS
200.0000 mg | ORAL_CAPSULE | Freq: Two times a day (BID) | ORAL | 0 refills | Status: AC | PRN
  Filled 2024-09-26: qty 30, 15d supply, fill #0

## 2024-09-26 NOTE — ED Triage Notes (Signed)
 Pt c/o left knee pain for the last week. Pt denies known injury. Pain is described as sharp and throbbing. She has been taking tylenol and turmeric with with some relief.

## 2024-09-26 NOTE — Discharge Instructions (Addendum)
 Left knee pain and swelling: Right and left lower legs measure essentially equally.  No pain with dorsiflexion.  Patient's pain in the knee is at the patella bone.  Discussed ultrasound to rule out DVT is patient is concerned about blood clots (her mother passed away from complications of a blood clot).  However she declined an ultrasound because she was not confident of her VA insurance would pay for it.  Offered a left knee x-ray and it was declined also.  There is a low rate of suspicion for a blood clot but patient does have chronic swelling of both legs.  The patient is a former smoker but not for some time.  She is not on any hormones or other medications that might increase her clotting risk.  Celebrex, 200 mg, 1 pill twice daily with food as needed for knee pain.  Encouraged [facility] with the [facility] for further workup as needed.  Return here as needed.

## 2024-09-26 NOTE — ED Provider Notes (Signed)
 [facility]    CSN: [id number] Arrival date & time: 09/26/24  1307      History   Chief Complaint Chief Complaint  Patient presents with   Knee Pain    HPI Sarah Santos is a 73 y.o. female.   73 year old female with left knee pain that started on approximately 09/18/2024 or earlier.  She denies any injury nor trauma.  This is sharp and throbbing pain and its at or below the patella.  She has tried turmeric and Tylenol and both have given her some relief.  Near the end of the visit the patient asked if I thought she had a blood clot in her leg.  Her mother passed away from complications of a DVT.  The patient was concerned because of the swelling that maybe she had a blood clot.  Her pain however is in the patella and not the calf.  She is a former but not active smoker, she is not physically inactive, she is not on any hormones and she does not have any other major risk factors for DVT.   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   Dyslipidemia 05/08/2009    On Simvastatin     Dyspnea on exertion 07/17/2019   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   GERD    HEADACHE, CHRONIC    Heart murmur    Hematuria, microscopic    History of anemia    Hx of adenomatous colonic polyps    Hypercalcemia    HYPERLIPIDEMIA    Hypertension    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   Menopausal symptom 05/26/2018   Microscopic hematuria 07/02/2009   Qualifier: Diagnosis of  By: Inocencio MD, Berwyn A    OSA on CPAP    Personal history of colonic polyps 01/04/2008   Plantar fasciitis    Primary hyperparathyroidism    Routine general medical examination at a  health care facility 06/13/2015    Patient Active Problem List   Diagnosis Date Noted   Well woman exam with routine gynecological exam 01/17/2024   Osteopenia after menopause 01/17/2024   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   Pre-diabetes 05/03/2023   Morbid obesity (HCC) 04/26/2022   Hematuria, microscopic    Heart murmur    Degenerative arthritis of left knee 08/02/2019   Dyspnea on exertion 07/17/2019   OSA on CPAP 05/26/2017   Routine general medical examination at a health care facility 06/13/2015   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   Hypertension 04/04/2012   Primary hyperparathyroidism 01/25/2011   GERD 01/19/2011   Dyslipidemia 05/08/2009   Allergic rhinitis 05/08/2009   History of colonic polyps 01/04/2008   Carcinoid tumor of colon (HCC) 2009    Past Surgical History:  Procedure Laterality Date   carpal tunnel Bilateral    COLONOSCOPY W/ POLYPECTOMY  2009, 08/18/11   2009:adenomas, largest 4 cm, rectal carcinoid, hemorrhoids   ESOPHAGOGASTRODUODENOSCOPY     ESOPHAGOGASTRODUODENOSCOPY (EGD) WITH PROPOFOL  N/A 02/29/2024   Procedure: ESOPHAGOGASTRODUODENOSCOPY (EGD) WITH PROPOFOL ;  Surgeon: Avram Lupita BRAVO, MD;  Location: [facility];  Service: Gastroenterology;  Laterality: N/A;   OVARIAN  CYST REMOVAL  70's   TUBAL LIGATION      OB History     Gravida  3   Para  2   Term      Preterm      AB  1   Living  2      SAB      IAB      Ectopic      Multiple      Live Births               Home Medications    Prior to Admission medications   Medication Sig Start Date End Date Taking? Authorizing Provider  celecoxib (CELEBREX) 200 MG capsule Take 1 capsule (200 mg total) by mouth 2 (two) times daily as needed for up to 15 days. 09/26/24 10/11/24 Yes Ival Domino, FNP  acetaminophen (TYLENOL) 500 MG tablet Take 500 mg by mouth every 6 (six) hours as needed.    [provider]  Cholecalciferol 50 MCG (2000 UT)  TABS Take 1 tablet by mouth daily. 09/14/23   [provider]  clotrimazole -betamethasone  (LOTRISONE ) cream Apply topically 2 (two) times daily. 05/22/24   Rollene Almarie LABOR, MD  Glucosamine HCl (GLUCOSAMINE PO) Take by mouth.    [provider]  loratadine  (CLARITIN ) 10 MG tablet Take 10 mg by mouth daily as needed for allergies.    [provider]  losartan  (COZAAR ) 50 MG tablet Take 1 tablet (50 mg total) by mouth daily. 05/01/24   Rollene Almarie LABOR, MD  losartan -hydrochlorothiazide  (HYZAAR) 50-12.5 MG tablet Take 1 tablet by mouth daily. 05/01/24   Rollene Almarie LABOR, MD  Multiple Vitamin (MULTIVITAMIN PO) Take by mouth.    [provider]  nystatin -triamcinolone  (MYCOLOG II) cream Apply 1 application topically daily as needed. 11/03/21   Lavoie, Marie-Lyne, MD  pantoprazole  (PROTONIX ) 40 MG tablet Take 1 tablet (40 mg total) by mouth daily. 05/01/24   Rollene Almarie LABOR, MD  simvastatin  (ZOCOR ) 20 MG tablet Take 1 tablet (20 mg total) by mouth 2 (two) times a week. 05/02/24   Rollene Almarie LABOR, MD  Turmeric (QC TUMERIC COMPLEX PO) Take by mouth.    [provider]    Family History Family History  Problem Relation Age of Onset   Pulmonary embolism Mother    Hypertension Mother    Stroke Father    Diabetes Father    Heart failure Father    Heart disease Father    Colon polyps Sister    Multiple myeloma Sister    Other Brother        prediabetes   Hyperlipidemia Brother    Heart disease Brother    Colon polyps Brother    Stomach cancer Paternal Aunt    Cancer Paternal Aunt        Lung   Diabetes Paternal Grandmother    Hyperlipidemia Daughter    Other Daughter        prediabetes   Colon cancer Neg Hx    Esophageal cancer Neg Hx    Rectal cancer Neg Hx     Social History Social History   Tobacco Use   Smoking status: Former    Current packs/day: 0.00    Types: Cigarettes    Quit date: 1980    Years since quitting:  45.7   Smokeless tobacco: Never  Vaping Use   Vaping status: Never Used  Substance Use Topics   Alcohol use: No   Drug use: No  Allergies   Patient has no known allergies.   Review of Systems Review of Systems  Constitutional:  Negative for fever.  Respiratory:  Negative for cough.   Cardiovascular:  Negative for chest pain.  Gastrointestinal:  Negative for abdominal pain, constipation, diarrhea, nausea and vomiting.  Musculoskeletal:  Positive for joint swelling (Pain and swelling of left knee). Negative for arthralgias and back pain.  Skin:  Negative for color change and rash.  Neurological:  Negative for syncope.  All other systems reviewed and are negative.    Physical Exam Triage Vital Signs ED Triage Vitals  Encounter Vitals Group     BP 09/26/24 1328 118/60     Girls Systolic BP Percentile --      Girls Diastolic BP Percentile --      Boys Systolic BP Percentile --      Boys Diastolic BP Percentile --      Pulse Rate 09/26/24 1328 85     Resp 09/26/24 1328 20     Temp 09/26/24 1328 97.6 F (36.4 C)     Temp Source 09/26/24 1328 Oral     SpO2 09/26/24 1328 96 %     Weight --      Height --      Head Circumference --      Peak Flow --      Pain Score 09/26/24 1325 5     Pain Loc --      Pain Education --      Exclude from Growth Chart --    No data found.  Updated Vital Signs BP 118/60 (BP Location: Right Arm)   Pulse 85   Temp 97.6 F (36.4 C) (Oral)   Resp 20   SpO2 96%   Visual Acuity Right Eye Distance:   Left Eye Distance:   Bilateral Distance:    Right Eye Near:   Left Eye Near:    Bilateral Near:     Physical Exam Vitals and nursing note reviewed.  Constitutional:      General: She is not in acute distress.    Appearance: She is well-developed. She is not ill-appearing or toxic-appearing.  HENT:     Head: Normocephalic and atraumatic.     Right Ear: External ear normal.     Left Ear: External ear normal.     Nose: Nose  normal.     Mouth/Throat:     Lips: Pink.     Mouth: Mucous membranes are moist.  Eyes:     Conjunctiva/sclera: Conjunctivae normal.     Pupils: Pupils are equal, round, and reactive to light.  Cardiovascular:     Rate and Rhythm: Normal rate and regular rhythm.     Heart sounds: S1 normal and S2 normal. No murmur heard. Pulmonary:     Effort: Pulmonary effort is normal. No respiratory distress.     Breath sounds: Normal breath sounds. No decreased breath sounds, wheezing, rhonchi or rales.  Musculoskeletal:        General: No swelling.     Right hip: Normal.     Left hip: Normal.     Right upper leg: Normal.     Left upper leg: Normal.     Right knee: Swelling present. No deformity, effusion, erythema, ecchymosis, lacerations, bony tenderness or crepitus. Normal range of motion. No tenderness. No LCL laxity, MCL laxity, ACL laxity or PCL laxity. Normal alignment, normal meniscus and normal patellar mobility. Normal pulse.     Left knee: Swelling and bony  tenderness (At the patella) present. No deformity, effusion, erythema, ecchymosis, lacerations or crepitus. Normal range of motion. Tenderness (At the patella) present. No LCL laxity, MCL laxity, ACL laxity or PCL laxity.Normal alignment, normal meniscus and normal patellar mobility. Normal pulse.     Right lower leg: Swelling present. No deformity, lacerations, tenderness or bony tenderness. No edema.     Left lower leg: Swelling present. No deformity, lacerations, tenderness or bony tenderness. No edema.     Right ankle: Swelling present. No deformity, ecchymosis or lacerations. No tenderness. Normal range of motion. Anterior drawer test negative. Normal pulse.     Left ankle: Swelling present. No deformity, ecchymosis or lacerations. No tenderness. Normal range of motion. Anterior drawer test negative. Normal pulse.     Comments: Leg measurements: Left leg below the knee is 48 cm and left leg above the ankle is 30 cm.  Right leg below  the knee is 49 cm and right leg above the ankle is 29 cm.  [place]' sign is negative bilaterally  Skin:    General: Skin is warm and dry.     Capillary Refill: Capillary refill takes less than 2 seconds.     Findings: No rash.  Neurological:     Mental Status: She is alert and oriented to person, place, and time.  Psychiatric:        Mood and Affect: Mood normal.      UC Treatments / Results  Labs (all labs ordered are listed, but only abnormal results are displayed) Comprehensive metabolic panel with GFR: 05/01/24:    Component Ref Range & Units (hover) 4 mo ago (05/01/24)  Sodium 137  Potassium 3.9  Chloride 102  CO2 29  Glucose, Bld 83  BUN 17  Creatinine, Ser 0.95  Total Bilirubin 0.4  Alkaline Phosphatase 95  AST 15  ALT 14  Total Protein 7.5  Albumin 4.4  GFR 59.97 Low   Comment: Calculated using the CKD-EPI Creatinine Equation (2021)  Calcium 11.1 High   Resulting Agency [facility]      EKG   Radiology No results found.  Procedures Procedures (including critical care time)  Medications Ordered in UC Medications - No data to display  Initial Impression / Assessment and Plan / UC Course  I have reviewed the triage vital signs and the nursing notes.  Pertinent labs & imaging results that were available during my care of the patient were reviewed by me and considered in my medical decision making (see chart for details).  Plan of Care: Left knee pain and swelling: Right and left lower legs measure essentially equally.  No pain with dorsiflexion.  Patient's pain in the knee is at the patella bone.  Discussed ultrasound to rule out DVT is patient is concerned about blood clots (her mother passed away from complications of a blood clot).  However she declined an ultrasound because she was not confident of her [facility] insurance would pay for it.  Offered a left knee x-ray and it was declined also.  There is a low rate of suspicion for a blood clot but patient does  have chronic swelling of both legs.  The patient is a former smoker but not for some time.  She is not on any hormones or other medications that might increase her clotting risk.  Celebrex, 200 mg, 1 pill twice daily with food as needed for knee pain.  Enc[facility]aged to [facility] with the [facility] for further workup as needed.  Return here as needed.  I reviewed the plan of care with the patient and/or the patient's guardian.  The patient and/or guardian had time to ask questions and acknowledged that the questions were answered.  I provided instruction on symptoms or reasons to return here or to go to an ER, if symptoms/condition did not improve, worsened or if new symptoms occurred.  Final Clinical Impressions(s) / UC Diagnoses   Final diagnoses:  Acute pain of left knee  Swelling of left knee     Discharge Instructions      Left knee pain and swelling: Right and left lower legs measure essentially equally.  No pain with dorsiflexion.  Patient's pain in the knee is at the patella bone.  Discussed ultrasound to rule out DVT is patient is concerned about blood clots (her mother passed away from complications of a blood clot).  However she declined an ultrasound because she was not confident of her [facility] insurance would pay for it.  Offered a left knee x-ray and it was declined also.  There is a low rate of suspicion for a blood clot but patient does have chronic swelling of both legs.  The patient is a former smoker but not for some time.  She is not on any hormones or other medications that might increase her clotting risk.  Celebrex, 200 mg, 1 pill twice daily with food as needed for knee pain.  Encouraged to [facility] with the [facility] for further workup as needed.  Return here as needed.       ED Prescriptions     Medication Sig Dispense Auth. Provider   celecoxib (CELEBREX) 200 MG capsule Take 1 capsule (200 mg total) by mouth 2 (two) times daily as needed for up to 15 days. 30 capsule Ival Domino, FNP      PDMP not reviewed this encounter.   Ival Domino, FNP 09/26/24 1429

## 2024-11-04 ENCOUNTER — Ambulatory Visit: Admitting: Internal Medicine

## 2024-11-04 ENCOUNTER — Encounter: Payer: Self-pay | Admitting: Internal Medicine

## 2024-11-04 VITALS — BP 120/60 | HR 78 | Temp 98.4°F | Ht 61.5 in | Wt 217.6 lb

## 2024-11-04 DIAGNOSIS — R7303 Prediabetes: Secondary | ICD-10-CM | POA: Diagnosis not present

## 2024-11-04 DIAGNOSIS — I10 Essential (primary) hypertension: Secondary | ICD-10-CM | POA: Diagnosis not present

## 2024-11-04 DIAGNOSIS — D649 Anemia, unspecified: Secondary | ICD-10-CM | POA: Diagnosis not present

## 2024-11-04 LAB — POCT GLYCOSYLATED HEMOGLOBIN (HGB A1C): Hemoglobin A1C: 6.2 % — AB (ref 4.0–5.6)

## 2024-11-04 MED ORDER — SIMVASTATIN 20 MG PO TABS
20.0000 mg | ORAL_TABLET | ORAL | 3 refills | Status: AC
Start: 2024-11-04 — End: ?

## 2024-11-04 NOTE — Progress Notes (Signed)
 "  Subjective:   Patient ID: Sarah Santos, female    DOB: 03-28-1951, 73 y.o.   MRN: [id number]  Discussed the use of AI scribe software for clinical note transcription with the patient, who gave verbal consent to proceed.  History of Present Illness Sarah Santos Sarah Santos is a 73 year old female who presents with increased fatigue.  She has a history of mildly low hemoglobin levels for years, with the highest recorded level being 11.8. She underwent an endoscopy earlier this year as part of the evaluation for potential microscopic bleeding in the gastrointestinal tract. There is a family history of sickle cell trait. She has experienced fluctuations in her iron levels over the years, sometimes normal and sometimes low.  She is not currently taking a multivitamin due to concerns about calcium content affecting her thyroid , but she does take vitamin D . She previously attempted to take iron supplements but discontinued them due to constipation and acid reflux. She occasionally takes iron supplements sporadically.  Her A1c level is currently 6.2, which is consistent with previous results and falls within the prediabetes range. She has previously consulted with a nutritionist through the [facility], primarily for weight management rather than blood sugar control.  She inquires about dietary sources of iron and mentions cooking with a cast iron skillet to increase iron intake.   No recent blood in stool or dark, tarry stools.  Review of Systems  Constitutional:  Positive for fatigue.  HENT: Negative.    Eyes: Negative.   Respiratory:  Negative for cough, chest tightness and shortness of breath.   Cardiovascular:  Negative for chest pain, palpitations and leg swelling.  Gastrointestinal:  Negative for abdominal distention, abdominal pain, constipation, diarrhea, nausea and vomiting.  Musculoskeletal: Negative.   Skin: Negative.   Neurological: Negative.    Psychiatric/Behavioral: Negative.      Objective:  Physical Exam Constitutional:      Appearance: She is well-developed.  HENT:     Head: Normocephalic and atraumatic.  Cardiovascular:     Rate and Rhythm: Normal rate and regular rhythm.  Pulmonary:     Effort: Pulmonary effort is normal. No respiratory distress.     Breath sounds: Normal breath sounds. No wheezing or rales.  Abdominal:     General: Bowel sounds are normal. There is no distension.     Palpations: Abdomen is soft.     Tenderness: There is no abdominal tenderness.  Musculoskeletal:     Cervical back: Normal range of motion.  Skin:    General: Skin is warm and dry.  Neurological:     Mental Status: She is alert and oriented to person, place, and time.     Coordination: Coordination normal.     Vitals:   11/04/24 1051  BP: 120/60  Pulse: 78  Temp: 98.4 F (36.9 C)  TempSrc: Oral  SpO2: 91%  Weight: 217 lb 9.6 oz (98.7 kg)  Height: 5' 1.5 (1.562 m)    Assessment and Plan Assessment & Plan Chronic mild anemia   Hemoglobin remains mildly low, possibly due to sickle cell trait or microscopic GI bleeding. Iron levels are variable, and previous supplementation caused side effects. Recheck vitamin B12 and iron levels. Provided a list of iron-rich foods. Consider iron supplementation a few times a week if tolerated.  Prediabetes   A1c is done today and 6.2, indicating prediabetes with no progression to diabetes. Previous nutritionist consultation focused on weight management. Referred to a nutritionist for dietary counseling. Monitor A1c levels every  six months.   "

## 2024-11-04 NOTE — Patient Instructions (Addendum)
 Your HgA1c is 6.2 today. We will get you in with the nutritionist.

## 2024-11-07 NOTE — Assessment & Plan Note (Signed)
 A1c is done today and 6.2, indicating prediabetes with no progression to diabetes. Previous nutritionist consultation focused on weight management. Referred to a nutritionist for dietary counseling. Monitor A1c levels every six months.

## 2024-11-07 NOTE — Assessment & Plan Note (Signed)
 BP at goal and reviewed last labs which are stable. Continue current regimen.

## 2024-11-07 NOTE — Assessment & Plan Note (Signed)
 Hemoglobin remains mildly low, possibly due to sickle cell trait or microscopic GI bleeding. Iron levels are variable, and previous supplementation caused side effects. Recheck vitamin B12 and iron levels with next labs. Provided a list of iron-rich foods. Consider iron supplementation a few times a week if tolerated.

## 2024-11-07 NOTE — Assessment & Plan Note (Signed)
 Counseled that weight impacts pre-diabetes and referral to nutritionist to help.

## 2025-09-11 ENCOUNTER — Ambulatory Visit
